# Patient Record
Sex: Female | Born: 1995 | Race: White | Hispanic: No | Marital: Single | Smoking: Never smoker
Health system: Southern US, Community
[De-identification: ages and names within clinical notes are randomized; demographics above are authoritative.]

## PROBLEM LIST (undated history)

## (undated) ENCOUNTER — Inpatient Hospital Stay: Payer: Self-pay

## (undated) DIAGNOSIS — Z789 Other specified health status: Secondary | ICD-10-CM

## (undated) DIAGNOSIS — F32A Depression, unspecified: Secondary | ICD-10-CM

## (undated) DIAGNOSIS — F329 Major depressive disorder, single episode, unspecified: Secondary | ICD-10-CM

## (undated) HISTORY — PX: TONSILLECTOMY: SUR1361

## (undated) HISTORY — PX: WISDOM TOOTH EXTRACTION: SHX21

## (undated) HISTORY — PX: TONSILLECTOMY: SHX5217

---
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
## 2017-07-10 DIAGNOSIS — Z3400 Encounter for supervision of normal first pregnancy, unspecified trimester: Secondary | ICD-10-CM | POA: Insufficient documentation

## 2017-07-15 ENCOUNTER — Other Ambulatory Visit: Payer: Self-pay | Admitting: Obstetrics and Gynecology

## 2017-07-15 DIAGNOSIS — Z369 Encounter for antenatal screening, unspecified: Secondary | ICD-10-CM

## 2017-07-24 ENCOUNTER — Ambulatory Visit (HOSPITAL_BASED_OUTPATIENT_CLINIC_OR_DEPARTMENT_OTHER)
Admission: RE | Admit: 2017-07-24 | Discharge: 2017-07-24 | Disposition: A | Discharge status: Home / Self Care | Payer: BLUE CROSS/BLUE SHIELD | Source: Ambulatory Visit | Attending: Obstetrics and Gynecology | Admitting: Obstetrics and Gynecology

## 2017-07-24 ENCOUNTER — Ambulatory Visit
Admission: RE | Admit: 2017-07-24 | Discharge: 2017-07-24 | Disposition: A | Discharge status: Home / Self Care | Payer: BLUE CROSS/BLUE SHIELD | Source: Ambulatory Visit | Attending: Obstetrics and Gynecology | Admitting: Obstetrics and Gynecology

## 2017-07-24 VITALS — BP 136/74 | HR 96 | Temp 98.1°F | Resp 20 | Wt 145.8 lb

## 2017-07-24 DIAGNOSIS — Z3A12 12 weeks gestation of pregnancy: Secondary | ICD-10-CM | POA: Insufficient documentation

## 2017-07-24 DIAGNOSIS — Z8639 Personal history of other endocrine, nutritional and metabolic disease: Secondary | ICD-10-CM | POA: Insufficient documentation

## 2017-07-24 DIAGNOSIS — Z369 Encounter for antenatal screening, unspecified: Secondary | ICD-10-CM | POA: Diagnosis not present

## 2017-07-24 NOTE — Progress Notes (Addendum)
Length of Consultation: 40 minutes   [name]  was referred to [facility] for genetic counseling to review prenatal screening and testing options.  This note summarizes the information we discussed.    We offered the following routine screening tests for this pregnancy:  First trimester screening, which includes nuchal translucency ultrasound screen and first trimester maternal serum marker screening.  The nuchal translucency has approximately an 80% detection rate for Down syndrome and can be positive for other chromosome abnormalities as well as congenital heart defects.  When combined with a maternal serum marker screening, the detection rate is up to 90% for Down syndrome and up to 97% for trisomy 18.     Maternal serum marker screening, a blood test that measures pregnancy proteins, can provide risk assessments for Down syndrome, trisomy 18, and open neural tube defects (spina bifida, anencephaly). Because it does not directly examine the fetus, it cannot positively diagnose or rule out these problems.  Targeted ultrasound uses high frequency sound waves to create an image of the developing fetus.  An ultrasound is often recommended as a routine means of evaluating the pregnancy.  It is also used to screen for fetal anatomy problems (for example, a heart defect) that might be suggestive of a chromosomal or other abnormality.   Should these screening tests indicate an increased concern, then the following additional testing options would be offered:  The chorionic villus sampling procedure is available for first trimester chromosome analysis.  This involves the withdrawal of a small amount of chorionic villi (tissue from the developing placenta).  Risk of pregnancy loss is estimated to be approximately 1 in 200 to 1 in 100 (0.5 to 1%).  There is approximately a 1% (1 in 100) chance that the CVS chromosome results will be unclear.  Chorionic villi cannot be  tested for neural tube defects.     Amniocentesis involves the removal of a small amount of amniotic fluid from the sac surrounding the fetus with the use of a thin needle inserted through the maternal abdomen and uterus.  Ultrasound guidance is used throughout the procedure.  Fetal cells from amniotic fluid are directly evaluated and > 99.5% of chromosome problems and > 98% of open neural tube defects can be detected. This procedure is generally performed after the 15th week of pregnancy.  The main risks to this procedure include complications leading to miscarriage in less than 1 in 200 cases (0.5%).  As another option for information if the pregnancy is suspected to be an an increased chance for certain chromosome conditions, we also reviewed the availability of cell free fetal DNA testing from maternal blood to determine whether or not the baby may have either Down syndrome, trisomy 74, or trisomy 53.  This test utilizes a maternal blood sample and DNA sequencing technology to isolate circulating cell free fetal DNA from maternal plasma.  The fetal DNA can then be analyzed for DNA sequences that are derived from the three most common chromosomes involved in aneuploidy, chromosomes 13, 18, and 21.  If the overall amount of DNA is greater than the expected level for any of these chromosomes, aneuploidy is suspected.  While we do not consider it a replacement for invasive testing and karyotype analysis, a negative result from this testing would be reassuring, though not a guarantee of a normal chromosome complement for the baby.  An abnormal result is certainly suggestive of an abnormal chromosome complement, though we would still recommend  CVS or amniocentesis to confirm any findings from this testing.  Cystic Fibrosis and Spinal Muscular Atrophy (SMA) screening were also discussed with the patient. Both conditions are recessive, which means that both parents must be carriers in order to have a child with  the disease.  Cystic fibrosis (CF) is one of the most common genetic conditions in persons of Caucasian ancestry.  This condition occurs in approximately 1 in 2,500 Caucasian persons and results in thickened secretions in the lungs, digestive, and reproductive systems.  For a baby to be at risk for having CF, both of the parents must be carriers for this condition.  Approximately 1 in 40 Caucasian persons is a carrier for CF.  Current carrier testing looks for the most common mutations in the gene for CF and can detect approximately 90% of carriers in the Caucasian population.  This means that the carrier screening can greatly reduce, but cannot eliminate, the chance for an individual to have a child with CF.  If an individual is found to be a carrier for CF, then carrier testing would be available for the partner. As part of [place] newborn screening profile, all babies born in the state of [place] will have a two-tier screening process.  Specimens are first tested to determine the concentration of immunoreactive trypsinogen (IRT).  The top 5% of specimens with the highest IRT values then undergo DNA testing using a panel of over 40 common CF mutations. SMA is a neurodegenerative disorder that leads to atrophy of skeletal muscle and overall weakness.  This condition is also more prevalent in the Caucasian population, with 1 in 40-1 in 60 persons being a carrier and 1 in 6,000-1 in 10,000 children being affected.  There are multiple forms of the disease, with some causing death in infancy to other forms with survival into adulthood.  The genetics of SMA is complex, but carrier screening can detect up to 95% of carriers in the Caucasian population.  Similar to CF, a negative result can greatly reduce, but cannot eliminate, the chance to have a child with SMA.  We obtained a detailed family history and pregnancy history.  [name] reported one paternal uncle who has a son with creatine deficiency.   He has seizures, delayed speech and delayed motor skills.  The family states that this was inherited from the child's mother (who is not related to our patient), and not the father.  There is an x-linked form of Creatine deficiency; we reviewed x-linked inheritance and the fact that if this were the correct mode of inheritance then this pregnancy would not be expected to be at risk.  However, we are happy to review medical records on this cousin if desired to help confirm the accuracy of this risk assessment.  There are various family members with RA, lupus, mental health conditions which we reviewed may have multifactorial causes, including both genetic and environmental factors. The remainder of the family history was reported to be unremarkable for birth defects, intellectual delays, recurrent pregnancy loss or known chromosome abnormalities.  [name] stated that this is her first pregnancy.  She reported no complications or exposures that would be expected to increase the risk for birth defects in this pregnancy.   After consideration of the options, [name] elected to proceed with first trimester screening and to declined CF and SMA carrier screening.  An ultrasound was performed at the time of the visit.  The gestational age was consistent with  12 weeks.  Fetal anatomy could not be assessed due to early gestational age.  Please refer to the ultrasound report for details of that study.  [name] was encouraged to call with questions or concerns.  We can be contacted at [phone].    Wilburt Finlay, MS, CGC I met with pt and agree with plan for first tri screen  H/o X linked creatine deficiency on her father's side (uncle)  Pt was told more information could be obtained on his history if she wished further evaluaiton she declined at this time

## 2017-07-28 ENCOUNTER — Telehealth: Payer: Self-pay | Admitting: Obstetrics and Gynecology

## 2017-07-28 NOTE — Telephone Encounter (Signed)
Ms. Rennis Hardingllis  elected to undergo First Trimester screening on 07/24/2017.  To review, first trimester screening, includes nuchal translucency ultrasound screen and/or first trimester maternal serum marker screening.  The nuchal translucency has approximately an 80% detection rate for Down syndrome and can be positive for other chromosome abnormalities as well as heart defects.  When combined with a maternal serum marker screening, the detection rate is up to 90% for Down syndrome and up to 97% for trisomy 13 and 18.     The results of the First Trimester Nuchal Translucency and Biochemical Screening were within normal range.  The risk for Down syndrome is now estimated to be 1 in 384.  The risk for Trisomy 13/18 is 1 in >10,000.  Should more definitive information be desired, we would offer amniocentesis.  Because we do not yet know the effectiveness of combined first and second trimester screening, we do not recommend a maternal serum screen to assess the chance for chromosome conditions.  However, if screening for neural tube defects is desired, maternal serum screening for AFP only can be performed between 15 and [redacted] weeks gestation.     Cherly Andersoneborah F. Worley Radermacher, MS, CGC

## 2017-10-24 ENCOUNTER — Observation Stay
Admission: EM | Admit: 2017-10-24 | Discharge: 2017-10-24 | Disposition: A | Discharge status: Home / Self Care | Payer: BLUE CROSS/BLUE SHIELD | Attending: Obstetrics & Gynecology | Admitting: Obstetrics & Gynecology

## 2017-10-24 DIAGNOSIS — Z882 Allergy status to sulfonamides status: Secondary | ICD-10-CM | POA: Diagnosis not present

## 2017-10-24 DIAGNOSIS — Z3A26 26 weeks gestation of pregnancy: Secondary | ICD-10-CM | POA: Insufficient documentation

## 2017-10-24 DIAGNOSIS — O9989 Other specified diseases and conditions complicating pregnancy, childbirth and the puerperium: Principal | ICD-10-CM | POA: Insufficient documentation

## 2017-10-24 DIAGNOSIS — M545 Low back pain: Secondary | ICD-10-CM | POA: Insufficient documentation

## 2017-10-24 DIAGNOSIS — Z3490 Encounter for supervision of normal pregnancy, unspecified, unspecified trimester: Secondary | ICD-10-CM

## 2017-10-24 HISTORY — DX: Other specified health status: Z78.9

## 2017-10-24 LAB — URINALYSIS, ROUTINE W REFLEX MICROSCOPIC
Bilirubin Urine: NEGATIVE
Glucose, UA: NEGATIVE mg/dL
Hgb urine dipstick: NEGATIVE
KETONES UR: NEGATIVE mg/dL
LEUKOCYTES UA: NEGATIVE
NITRITE: NEGATIVE
PH: 6 (ref 5.0–8.0)
PROTEIN: NEGATIVE mg/dL
Specific Gravity, Urine: 1.02 (ref 1.005–1.030)

## 2017-10-24 MED ORDER — IBUPROFEN 800 MG PO TABS
400.0000 mg | ORAL_TABLET | Freq: Four times a day (QID) | ORAL | Status: DC | PRN
  Administered 2017-10-24: 400 mg via ORAL
  Filled 2017-10-24: qty 1

## 2017-10-24 MED ORDER — ACETAMINOPHEN 500 MG PO TABS
1000.0000 mg | ORAL_TABLET | Freq: Four times a day (QID) | ORAL | Status: DC | PRN
  Administered 2017-10-24: 1000 mg via ORAL
  Filled 2017-10-24: qty 2

## 2017-10-24 NOTE — OB Triage Note (Signed)
Pt is G1P0 that presents from ED c/o back pain rated 7/10 that started 10/23/17 at 1900. Pt describes pain as aching and laying down makes it worse. Pt denies VB, LOF and states positive FM.

## 2017-10-24 NOTE — Discharge Instructions (Signed)
You may take 1,000 mg tylenol as needed for pain every 6 hours. You may alternate between tylenol and Ibuprofen. You may take 400 mg ibuprofen as needed for pain until Sunday 10/26/17 then discontinue. You may use heat or ice on back rotating 15 minutes on and 15 minutes off as needed.

## 2017-10-29 NOTE — Discharge Summary (Signed)
Clayborn BignessCedar B Kelly is a 21 y.o. female. She is at 2273w0d gestation. Patient's last menstrual period was 04/30/2017. Estimated Date of Delivery: 02/04/18  [facility] Chief complaint: low back pain  Location: low back Onset/timing: this morning Duration: since then Quality: aching Severity: moderate Aggravating or alleviating conditions: nothing makes worse or better Associated signs/symptoms:  No contractions, no urinary symptoms, no change in bowel habits. Context: patient has strenuous job, lifting, and thinks she may have strained her back muscles, and continues to do so.  No relief with heating pad.  Has had this outside of pregnancy.    S: Resting comfortably. no CTX, no VB.no LOF,  Active fetal movement.   Maternal Medical History:   Past Medical History:  Diagnosis Date  . Medical history non-contributory     Past Surgical History:  Procedure Laterality Date  . TONSILLECTOMY Bilateral   . WISDOM TOOTH EXTRACTION Bilateral     Allergies  Allergen Reactions  . Sulfa Antibiotics     Prior to Admission medications   Medication Sig Start Date End Date Taking? Authorizing Provider  docusate sodium (COLACE) 100 MG capsule Take 100 mg by mouth daily.   Yes [provider]  doxylamine, Sleep, (UNISOM) 25 MG tablet Take 25 mg by mouth at bedtime as needed.   Yes [provider]  ferrous sulfate 325 (65 FE) MG EC tablet Take 325 mg by mouth 3 (three) times daily with meals.   Yes [provider]  Multiple Vitamin (MULTIVITAMIN) tablet Take 1 tablet by mouth daily.   Yes [provider]     Social History: She  reports that  has never smoked. she has never used smokeless tobacco. She reports that she does not drink alcohol or use drugs.  Family History: family history includes Lupus in her mother; Rheum arthritis in her mother. no history of gyn cancers  Review of Systems: A full review of systems was performed and  negative except as noted in the HPI.     O:  BP 123/63   Pulse 80   Temp 98.2 F (36.8 C) (Oral)   Resp 18   Ht 5\' 6"  (1.676 m)   Wt 72.1 kg (159 lb)   LMP 04/30/2017   BMI 25.66 kg/m  No results found for this or any previous visit (from the past 48 hour(s)).   Constitutional: NAD, AAOx3  HE/ENT: extraocular movements grossly intact, moist mucous membranes CV: RRR PULM: nl respiratory effort, CTABL    Back:  No step offs, verterbral tenderness, hip tenderness.  No decreased range of motion.  Able to bend and twist.  Abd: gravid, non-tender, non-distended, soft  Ext: Non-tender, Nonedmeatous   Psych: mood appropriate, speech normal Pelvic deferred  FHR 150 TOCO: none   A/P: 21 y.o. 2627w0d here for MSK back pain  Labor: not present.   Tylenol, ibuprofen (can take for 3 days), heating pad, rest.  D/c home stable, precautions reviewed, follow-up as scheduled.   ----- Ranae Plumberhelsea Ward, MD Attending Obstetrician and Gynecologist [facility], Department of OB/GYN [facility]

## 2017-12-23 NOTE — L&D Delivery Note (Signed)
Delivery Note At 10:50 AM a viable female was delivered via Vaginal, Spontaneous VTX / ROA  APGAR: 8, 9; weight  . Tight nuchal cord reduced   Placenta status:intact  , . Co rd:3V , delayed cord clamping  with the following complications: significant right side wall laceration . EBL 1300 cc Pt pushed for 90 min and ultimately delivered head over intact perineum . Shoulders delivered without difficulty . Vigorous female placed on mother's abd . Cord clamped+ cut at 60 sec. Placenta delivered intact . Significant right side wall laceration noted .2/3 of vagina length involved.2 running locking sutures used to control bleeding . Second degree lac repaired . Good hemostasis noted . Total EBL 1300cc  Mother tolerated well Anesthesia:  CLE Episiotomy:  none Lacerations: Sulcus;Vaginal;2nd degree Suture Repair: 2.0 3.0 vicryl Est. Blood Loss (mL): 1300   Mom to postpartum.  Baby to Couplet care / Skin to Skin.  Rachel Ortega Rachel Ortega 02/06/2018, 11:50 AM

## 2018-02-05 ENCOUNTER — Inpatient Hospital Stay
Admission: EM | Admit: 2018-02-05 | Discharge: 2018-02-08 | DRG: 806 | Disposition: A | Discharge status: Home / Self Care | Payer: Medicaid Other | Attending: Certified Nurse Midwife | Admitting: Certified Nurse Midwife

## 2018-02-05 ENCOUNTER — Other Ambulatory Visit: Payer: Self-pay

## 2018-02-05 DIAGNOSIS — Z3A40 40 weeks gestation of pregnancy: Secondary | ICD-10-CM | POA: Diagnosis not present

## 2018-02-05 DIAGNOSIS — O1493 Unspecified pre-eclampsia, third trimester: Secondary | ICD-10-CM

## 2018-02-05 DIAGNOSIS — O163 Unspecified maternal hypertension, third trimester: Secondary | ICD-10-CM | POA: Diagnosis present

## 2018-02-05 DIAGNOSIS — O9081 Anemia of the puerperium: Secondary | ICD-10-CM | POA: Diagnosis not present

## 2018-02-05 DIAGNOSIS — D696 Thrombocytopenia, unspecified: Secondary | ICD-10-CM | POA: Diagnosis not present

## 2018-02-05 DIAGNOSIS — D62 Acute posthemorrhagic anemia: Secondary | ICD-10-CM | POA: Diagnosis not present

## 2018-02-05 DIAGNOSIS — O1494 Unspecified pre-eclampsia, complicating childbirth: Principal | ICD-10-CM | POA: Diagnosis present

## 2018-02-05 DIAGNOSIS — D6959 Other secondary thrombocytopenia: Secondary | ICD-10-CM | POA: Diagnosis not present

## 2018-02-05 HISTORY — DX: Unspecified pre-eclampsia, third trimester: O14.93

## 2018-02-05 LAB — CBC
HCT: 37.7 % (ref 35.0–47.0)
HEMOGLOBIN: 13 g/dL (ref 12.0–16.0)
MCH: 32.9 pg (ref 26.0–34.0)
MCHC: 34.5 g/dL (ref 32.0–36.0)
MCV: 95.4 fL (ref 80.0–100.0)
PLATELETS: 172 10*3/uL (ref 150–440)
RBC: 3.95 MIL/uL (ref 3.80–5.20)
RDW: 13.3 % (ref 11.5–14.5)
WBC: 8.6 10*3/uL (ref 3.6–11.0)

## 2018-02-05 LAB — COMPREHENSIVE METABOLIC PANEL
ALBUMIN: 2.6 g/dL — AB (ref 3.5–5.0)
ALK PHOS: 215 U/L — AB (ref 38–126)
ALT: 11 U/L — ABNORMAL LOW (ref 14–54)
ANION GAP: 6 (ref 5–15)
AST: 18 U/L (ref 15–41)
BILIRUBIN TOTAL: 0.5 mg/dL (ref 0.3–1.2)
BUN: 11 mg/dL (ref 6–20)
CALCIUM: 8.7 mg/dL — AB (ref 8.9–10.3)
CO2: 25 mmol/L (ref 22–32)
Chloride: 107 mmol/L (ref 101–111)
Creatinine, Ser: 0.66 mg/dL (ref 0.44–1.00)
Glucose, Bld: 106 mg/dL — ABNORMAL HIGH (ref 65–99)
Potassium: 4 mmol/L (ref 3.5–5.1)
Sodium: 138 mmol/L (ref 135–145)
TOTAL PROTEIN: 6.6 g/dL (ref 6.5–8.1)

## 2018-02-05 LAB — TYPE AND SCREEN
ABO/RH(D): A POS
Antibody Screen: NEGATIVE

## 2018-02-05 LAB — PROTEIN / CREATININE RATIO, URINE
CREATININE, URINE: 97 mg/dL
Protein Creatinine Ratio: 0.91 mg/mg{Cre} — ABNORMAL HIGH (ref 0.00–0.15)
TOTAL PROTEIN, URINE: 88 mg/dL

## 2018-02-05 MED ORDER — TERBUTALINE SULFATE 1 MG/ML IJ SOLN: 0.2500 mg | Freq: Once | INTRAMUSCULAR | Status: DC | PRN

## 2018-02-05 MED ORDER — LACTATED RINGERS IV SOLN: 500.0000 mL | INTRAVENOUS | Status: DC | PRN

## 2018-02-05 MED ORDER — MISOPROSTOL 25 MCG QUARTER TABLET
25.0000 ug | ORAL_TABLET | Freq: Once | ORAL | Status: AC
  Administered 2018-02-06: 25 ug via BUCCAL
  Filled 2018-02-05: qty 1

## 2018-02-05 MED ORDER — LIDOCAINE HCL (PF) 1 % IJ SOLN: 30.0000 mL | INTRAMUSCULAR | Status: DC | PRN

## 2018-02-05 MED ORDER — OXYTOCIN 40 UNITS IN LACTATED RINGERS INFUSION - SIMPLE MED
2.5000 [IU]/h | INTRAVENOUS | Status: DC
  Filled 2018-02-05: qty 1000

## 2018-02-05 MED ORDER — ACETAMINOPHEN 325 MG PO TABS: 650.0000 mg | ORAL_TABLET | ORAL | Status: DC | PRN

## 2018-02-05 MED ORDER — ONDANSETRON HCL 4 MG/2ML IJ SOLN: 4.0000 mg | Freq: Four times a day (QID) | INTRAMUSCULAR | Status: DC | PRN

## 2018-02-05 MED ORDER — LACTATED RINGERS IV SOLN
INTRAVENOUS | Status: DC
  Administered 2018-02-05 – 2018-02-06 (×2): via INTRAVENOUS

## 2018-02-05 MED ORDER — BUTORPHANOL TARTRATE 1 MG/ML IJ SOLN
1.0000 mg | INTRAMUSCULAR | Status: DC | PRN
  Administered 2018-02-06 (×2): 1 mg via INTRAVENOUS
  Filled 2018-02-05 (×2): qty 1

## 2018-02-05 MED ORDER — OXYTOCIN BOLUS FROM INFUSION
500.0000 mL | Freq: Once | INTRAVENOUS | Status: AC
  Administered 2018-02-06: 500 mL via INTRAVENOUS

## 2018-02-05 MED ORDER — MISOPROSTOL 25 MCG QUARTER TABLET
25.0000 ug | ORAL_TABLET | ORAL | Status: DC | PRN
  Administered 2018-02-06: 25 ug via VAGINAL
  Filled 2018-02-05: qty 1

## 2018-02-05 NOTE — ED Notes (Signed)
Called L&D for room  States rooms are being cleaned  Instructed to hold pt until room is cleaned  Pt and husband made aware

## 2018-02-05 NOTE — H&P (Signed)
OB History & Physical   History of Present Illness:  Chief Complaint:   HPI:  Rachel Ortega is a 22 y.o. G1P0 female at 7565w1d dated by LMP c/w 162w1d u/s.  She presents to L&D for IOL due to preeclampsia without severe features at term.  She reports:  -active fetal movement -no leakage of fluid  -no vaginal bleeding -no contractions  Pregnancy Issues: 1. Heartburn 2. Elevated 1h OGTT, 3h WNL 3. Pruritis    Maternal Medical History:   Past Medical History:  Diagnosis Date  . Medical history non-contributory     Past Surgical History:  Procedure Laterality Date  . TONSILLECTOMY Bilateral   . WISDOM TOOTH EXTRACTION Bilateral     Allergies  Allergen Reactions  . Sulfa Antibiotics     Prior to Admission medications   Medication Sig Start Date End Date Taking? Authorizing Provider  doxylamine, Sleep, (UNISOM) 25 MG tablet Take 25 mg by mouth at bedtime as needed.   Yes [provider]  ferrous sulfate 325 (65 FE) MG EC tablet Take 325 mg by mouth 3 (three) times daily with meals.   Yes [provider]  Multiple Vitamin (MULTIVITAMIN) tablet Take 1 tablet by mouth daily.   Yes [provider]  docusate sodium (COLACE) 100 MG capsule Take 100 mg by mouth daily.    [provider]     [facility]    Social History: She  reports that  has never smoked. she has never used smokeless tobacco. She reports that she does not drink alcohol or use drugs.  Family History: family history includes Lupus in her mother; Rheum arthritis in her mother.   Review of Systems: A full review of systems was performed and negative except as noted in the HPI.    Physical Exam:  Vital Signs: BP (!) 136/92   Pulse (!) 108   LMP 04/30/2017   General:   alert, cooperative, appears stated age and no distress  Skin:  normal and no rash or abnormalities  Neurologic:    Alert & oriented x 3  Lungs:   clear to auscultation  bilaterally  Heart:   regular rate and rhythm, S1, S2 normal, no murmur, click, rub or gallop  Abdomen:  soft, non-tender; bowel sounds normal; no masses,  no organomegaly  Pelvis:  External genitalia: normal general appearance  FHT:  145 BPM  Presentations: cephalic  Cervix:    Dilation: 1cm   Effacement: 50%   Station:  -2   Consistency: medium   Position: posterior  Extremities: : non-tender, symmetric, no edema bilaterally.      EFW: 7lb  Results for orders placed or performed during the hospital encounter of 02/05/18 (from the past 24 hour(s))  Protein / creatinine ratio, urine     Status: Abnormal   Collection Time: 02/05/18  6:50 PM  Result Value Ref Range   Creatinine, Urine 97 mg/dL   Total Protein, Urine 88 mg/dL   Protein Creatinine Ratio 0.91 (H) 0.00 - 0.15 mg/mg[Cre]  CBC on admission     Status: None   Collection Time: 02/05/18  7:00 PM  Result Value Ref Range   WBC 8.6 3.6 - 11.0 K/uL   RBC 3.95 3.80 - 5.20 MIL/uL   Hemoglobin 13.0 12.0 - 16.0 g/dL   HCT 16.137.7 09.635.0 - 04.547.0 %   MCV 95.4 80.0 - 100.0 fL   MCH 32.9 26.0 - 34.0 pg   MCHC 34.5 32.0 -  36.0 g/dL   RDW 84.6 96.2 - 95.2 %   Platelets 172 150 - 440 K/uL  Comprehensive metabolic panel     Status: Abnormal   Collection Time: 02/05/18  7:00 PM  Result Value Ref Range   Sodium 138 135 - 145 mmol/L   Potassium 4.0 3.5 - 5.1 mmol/L   Chloride 107 101 - 111 mmol/L   CO2 25 22 - 32 mmol/L   Glucose, Bld 106 (H) 65 - 99 mg/dL   BUN 11 6 - 20 mg/dL   Creatinine, Ser 8.41 0.44 - 1.00 mg/dL   Calcium 8.7 (L) 8.9 - 10.3 mg/dL   Total Protein 6.6 6.5 - 8.1 g/dL   Albumin 2.6 (L) 3.5 - 5.0 g/dL   AST 18 15 - 41 U/L   ALT 11 (L) 14 - 54 U/L   Alkaline Phosphatase 215 (H) 38 - 126 U/L   Total Bilirubin 0.5 0.3 - 1.2 mg/dL   GFR calc non Af Amer >60 >60 mL/min   GFR calc Af Amer >60 >60 mL/min   Anion gap 6 5 - 15    Pertinent Results:  Prenatal Labs: Blood type/Rh A+  Antibody screen neg  Rubella  Immune  Varicella Immune  RPR NR  HBsAg Neg  HIV NR  GC neg  Chlamydia neg  Genetic screening negative  1 hour GTT 141  3 hour GTT 80, 140, 106, 117  GBS neg   FHT: FHR: 145 bpm, variability: moderate,  accelerations:  Present,  decelerations:  Present variable Category/reactivity:  Category II TOCO: none    Assessment:  Rachel Ortega is a 22 y.o. G1P0 female at [redacted]w[redacted]d with preeclampsia without severe features.   Plan:  1. Admit to Labor & Delivery; consents reviewed and obtained  2. Fetal Well being  - Fetal Tracing: Category II for non-recurrent variable decels, overall reassuring - GBS neg - Presentation: cephalic confirmed by [place]   3. Routine OB: - Prenatal labs reviewed, as above - Rh pos - CBC & T&S on admit - Clear fluids, IVF  4. Induction of Labor: -  External toco in place -  Pelvis unproven -  Plan for induction with misoprostol -  Plan for continuous fetal monitoring  -  Maternal pain control as desired: IVPM, nitrous, regional anesthesia -  Anticipate vaginal delivery  5. Preeclampsia without severe features: - Elevated P/C ratio - Reports no headache, visual changes, N/V, RUQ pain, SOB, or chest pain.  - CBC and CMP WNL - Mild range BPs  6. Post Partum Planning: - Infant feeding: breastfeeding - Contraception: POPs  Genia Del, [place] 02/05/2018 7:32 PM ----- Genia Del Certified Nurse Midwife [facility], Department of OB/GYN [facility]

## 2018-02-06 ENCOUNTER — Inpatient Hospital Stay: Payer: Medicaid Other | Admitting: Anesthesiology

## 2018-02-06 DIAGNOSIS — O1490 Unspecified pre-eclampsia, unspecified trimester: Secondary | ICD-10-CM

## 2018-02-06 LAB — CBC
HCT: 33.9 % — ABNORMAL LOW (ref 35.0–47.0)
HCT: 34.1 % — ABNORMAL LOW (ref 35.0–47.0)
HCT: 31.2 % — ABNORMAL LOW (ref 35.0–47.0)
HEMOGLOBIN: 10.5 g/dL — AB (ref 12.0–16.0)
Hemoglobin: 11.3 g/dL — ABNORMAL LOW (ref 12.0–16.0)
Hemoglobin: 11.4 g/dL — ABNORMAL LOW (ref 12.0–16.0)
MCH: 32.1 pg (ref 26.0–34.0)
MCH: 32.3 pg (ref 26.0–34.0)
MCH: 32.3 pg (ref 26.0–34.0)
MCHC: 33.2 g/dL (ref 32.0–36.0)
MCHC: 33.5 g/dL (ref 32.0–36.0)
MCHC: 33.5 g/dL (ref 32.0–36.0)
MCV: 95.8 fL (ref 80.0–100.0)
MCV: 97.2 fL (ref 80.0–100.0)
MCV: 96.3 fL (ref 80.0–100.0)
PLATELETS: 193 10*3/uL (ref 150–440)
Platelets: 202 10*3/uL (ref 150–440)
Platelets: 173 10*3/uL (ref 150–440)
RBC: 3.49 MIL/uL — AB (ref 3.80–5.20)
RBC: 3.56 MIL/uL — ABNORMAL LOW (ref 3.80–5.20)
RBC: 3.24 MIL/uL — AB (ref 3.80–5.20)
RDW: 13.1 % (ref 11.5–14.5)
RDW: 13.6 % (ref 11.5–14.5)
RDW: 13.5 % (ref 11.5–14.5)
WBC: 23.2 10*3/uL — AB (ref 3.6–11.0)
WBC: 24 10*3/uL — AB (ref 3.6–11.0)
WBC: 17.3 10*3/uL — ABNORMAL HIGH (ref 3.6–11.0)

## 2018-02-06 LAB — COMPREHENSIVE METABOLIC PANEL
ALK PHOS: 179 U/L — AB (ref 38–126)
ALT: 12 U/L — AB (ref 14–54)
AST: 37 U/L (ref 15–41)
Albumin: 2.2 g/dL — ABNORMAL LOW (ref 3.5–5.0)
Anion gap: 13 (ref 5–15)
BUN: 11 mg/dL (ref 6–20)
CALCIUM: 8.3 mg/dL — AB (ref 8.9–10.3)
CHLORIDE: 106 mmol/L (ref 101–111)
CO2: 17 mmol/L — AB (ref 22–32)
Creatinine, Ser: 0.67 mg/dL (ref 0.44–1.00)
GFR calc non Af Amer: >60 mL/min (ref >60)
Glucose, Bld: 117 mg/dL — ABNORMAL HIGH (ref 65–99)
Potassium: 3.5 mmol/L (ref 3.5–5.1)
SODIUM: 136 mmol/L (ref 135–145)
Total Bilirubin: 0.5 mg/dL (ref 0.3–1.2)
Total Protein: 5.6 g/dL — ABNORMAL LOW (ref 6.5–8.1)

## 2018-02-06 MED ORDER — ONDANSETRON HCL 4 MG PO TABS: 4.0000 mg | ORAL_TABLET | ORAL | Status: DC | PRN

## 2018-02-06 MED ORDER — DIBUCAINE 1 % RE OINT: 1.0000 "application " | TOPICAL_OINTMENT | RECTAL | Status: DC | PRN

## 2018-02-06 MED ORDER — SODIUM CHLORIDE 0.9 % IV SOLN
INTRAVENOUS | Status: DC | PRN
  Administered 2018-02-06 (×3): 5 mL via EPIDURAL

## 2018-02-06 MED ORDER — DEXTROMETHORPHAN POLISTIREX ER 30 MG/5ML PO SUER
15.0000 mg | Freq: Four times a day (QID) | ORAL | Status: DC | PRN
  Administered 2018-02-07 – 2018-02-08 (×3): 15 mg via ORAL
  Filled 2018-02-06 (×7): qty 5

## 2018-02-06 MED ORDER — ONDANSETRON HCL 4 MG/2ML IJ SOLN: 4.0000 mg | INTRAMUSCULAR | Status: DC | PRN

## 2018-02-06 MED ORDER — CARBOPROST TROMETHAMINE 250 MCG/ML IM SOLN
INTRAMUSCULAR | Status: AC
  Administered 2018-02-06: 250 ug
  Filled 2018-02-06: qty 1

## 2018-02-06 MED ORDER — MEASLES, MUMPS & RUBELLA VAC ~~LOC~~ INJ
0.5000 mL | INJECTION | Freq: Once | SUBCUTANEOUS | Status: DC
  Filled 2018-02-06 (×2): qty 0.5

## 2018-02-06 MED ORDER — DIPHENHYDRAMINE HCL 25 MG PO CAPS: 25.0000 mg | ORAL_CAPSULE | Freq: Once | ORAL | Status: DC | PRN

## 2018-02-06 MED ORDER — PHENYLEPHRINE 40 MCG/ML (10ML) SYRINGE FOR IV PUSH (FOR BLOOD PRESSURE SUPPORT)
80.0000 ug | PREFILLED_SYRINGE | INTRAVENOUS | Status: DC | PRN
  Filled 2018-02-06: qty 5

## 2018-02-06 MED ORDER — LACTATED RINGERS IV SOLN: 500.0000 mL | Freq: Once | INTRAVENOUS | Status: DC

## 2018-02-06 MED ORDER — MORPHINE SULFATE (PF) 4 MG/ML IV SOLN
5.0000 mg | Freq: Once | INTRAVENOUS | Status: AC
  Administered 2018-02-06: 5 mg via INTRAMUSCULAR

## 2018-02-06 MED ORDER — LIDOCAINE HCL (PF) 1 % IJ SOLN
INTRAMUSCULAR | Status: DC | PRN
  Administered 2018-02-06: 3 mL

## 2018-02-06 MED ORDER — SIMETHICONE 80 MG PO CHEW: 80.0000 mg | CHEWABLE_TABLET | ORAL | Status: DC | PRN

## 2018-02-06 MED ORDER — IBUPROFEN 600 MG PO TABS
600.0000 mg | ORAL_TABLET | Freq: Four times a day (QID) | ORAL | Status: DC
  Administered 2018-02-06 – 2018-02-08 (×8): 600 mg via ORAL
  Filled 2018-02-06 (×8): qty 1

## 2018-02-06 MED ORDER — EPHEDRINE 5 MG/ML INJ
10.0000 mg | INTRAVENOUS | Status: DC | PRN
  Filled 2018-02-06: qty 2

## 2018-02-06 MED ORDER — DIPHENHYDRAMINE HCL 25 MG PO CAPS: 25.0000 mg | ORAL_CAPSULE | Freq: Four times a day (QID) | ORAL | Status: DC | PRN

## 2018-02-06 MED ORDER — MAGNESIUM HYDROXIDE 400 MG/5ML PO SUSP: 30.0000 mL | ORAL | Status: DC | PRN

## 2018-02-06 MED ORDER — FENTANYL 2.5 MCG/ML W/ROPIVACAINE 0.15% IN NS 100 ML EPIDURAL (ARMC)
12.0000 mL/h | EPIDURAL | Status: DC
  Administered 2018-02-06: 12 mL/h via EPIDURAL

## 2018-02-06 MED ORDER — FERROUS SULFATE 325 (65 FE) MG PO TABS
325.0000 mg | ORAL_TABLET | Freq: Two times a day (BID) | ORAL | Status: DC
  Filled 2018-02-06: qty 1

## 2018-02-06 MED ORDER — WITCH HAZEL-GLYCERIN EX PADS
1.0000 "application " | MEDICATED_PAD | CUTANEOUS | Status: DC | PRN
  Administered 2018-02-07: 1 via TOPICAL
  Filled 2018-02-06: qty 100

## 2018-02-06 MED ORDER — MORPHINE SULFATE (PF) 10 MG/ML IV SOLN
INTRAVENOUS | Status: AC
  Filled 2018-02-06: qty 1

## 2018-02-06 MED ORDER — PRENATAL MULTIVITAMIN CH
1.0000 | ORAL_TABLET | Freq: Every day | ORAL | Status: DC
  Filled 2018-02-06 (×2): qty 1

## 2018-02-06 MED ORDER — SENNOSIDES-DOCUSATE SODIUM 8.6-50 MG PO TABS
2.0000 | ORAL_TABLET | ORAL | Status: DC
  Administered 2018-02-07 – 2018-02-08 (×2): 2 via ORAL
  Filled 2018-02-06: qty 2

## 2018-02-06 MED ORDER — ZOLPIDEM TARTRATE 5 MG PO TABS: 5.0000 mg | ORAL_TABLET | Freq: Every evening | ORAL | Status: DC | PRN

## 2018-02-06 MED ORDER — HYDROCODONE-ACETAMINOPHEN 5-325 MG PO TABS
1.0000 | ORAL_TABLET | ORAL | Status: DC | PRN
  Administered 2018-02-06 – 2018-02-07 (×4): 1 via ORAL
  Filled 2018-02-06 (×4): qty 1

## 2018-02-06 MED ORDER — ACETAMINOPHEN 325 MG PO TABS: 650.0000 mg | ORAL_TABLET | ORAL | Status: DC | PRN

## 2018-02-06 MED ORDER — MORPHINE SULFATE (PF) 4 MG/ML IV SOLN
5.0000 mg | Freq: Once | INTRAVENOUS | Status: AC
  Administered 2018-02-06: 5 mg via INTRAVENOUS

## 2018-02-06 MED ORDER — DIPHENHYDRAMINE HCL 50 MG/ML IJ SOLN: 12.5000 mg | INTRAMUSCULAR | Status: DC | PRN

## 2018-02-06 MED ORDER — FENTANYL 2.5 MCG/ML W/ROPIVACAINE 0.15% IN NS 100 ML EPIDURAL (ARMC)
EPIDURAL | Status: AC
  Filled 2018-02-06: qty 100

## 2018-02-06 MED ORDER — BENZOCAINE-MENTHOL 20-0.5 % EX AERO
1.0000 "application " | INHALATION_SPRAY | CUTANEOUS | Status: DC | PRN
  Administered 2018-02-06 – 2018-02-07 (×2): 1 via TOPICAL
  Filled 2018-02-06 (×2): qty 56

## 2018-02-06 MED ORDER — COCONUT OIL OIL: 1.0000 "application " | TOPICAL_OIL | Status: DC | PRN

## 2018-02-06 NOTE — Progress Notes (Signed)
Labor Progress Note  Rachel Ortega is a 22 y.o. G1P0 at 355w2d by LMP c/w 7083w1d u/s admitted for induction of labor due to preeclampsia without severe features.  Subjective:  Patient resting in triage room. Mom and boyfriend present for support. Reports good fetal movement, no VB, no LOF, no cramping or contractions. Reports no headache, visual changes, N/V, SOB, chest pain, or RUQ pain.   Objective: BP 137/86   Pulse 86   LMP 04/30/2017  Notable VS details: mild range Bps 120-143/83-101  Fetal Assessment: FHT:  FHR: 150 bpm, variability: moderate,  accelerations:  Present,  decelerations:  Absent Category/reactivity:  Category I UC:   none Membrane status: intact Amniotic color: n/a  Assessment / Plan: Admitted for IOL due to preeclampsia  Induction of Labor: Plan to administer 25mcg buccal misoprostol and 25mcg vaginal misoprostol as soon as patient is moved into L&D room, Preeclampsia:  labs stable and no S/S of severe features Fetal Wellbeing:  Category I Pain Control:  Epidural, IV pain meds and Nitrous Oxide available as needed and desired Anticipated MOD:  NSVD  Genia DelMargaret Akiyah Eppolito, CNM 02/06/2018, 12:07 AM

## 2018-02-06 NOTE — Anesthesia Procedure Notes (Signed)
Epidural Patient location during procedure: OB Start time: 02/06/2018 6:29 AM End time: 02/06/2018 6:50 AM  Staffing Anesthesiologist: Alver FisherPenwarden, Elizaveta Mattice, MD Performed: anesthesiologist   Preanesthetic Checklist Completed: patient identified, site marked, surgical consent, pre-op evaluation, timeout performed, IV checked, risks and benefits discussed and monitors and equipment checked  Epidural Patient position: sitting Prep: ChloraPrep Patient monitoring: heart rate, continuous pulse ox and blood pressure Approach: midline Location: L3-L4 Injection technique: LOR saline  Needle:  Needle type: Tuohy  Needle gauge: 18 G Needle length: 9 cm and 9 Needle insertion depth: 7 cm Catheter type: closed end flexible Catheter size: 20 Guage Catheter at skin depth: 11 cm Test dose: negative (0.125% bupivacaine)  Assessment Events: blood not aspirated, injection not painful, no injection resistance, negative IV test and no paresthesia  Additional Notes   Patient tolerated the insertion well without complications.Reason for block:procedure for pain

## 2018-02-06 NOTE — Progress Notes (Signed)
Labor Progress Note  Rachel Ortega is a 22 y.o. G1P0 at 567w2d by LMP c/w 7571w1d u/s admitted for induction of labor due to preeclampsia without severe features.  Subjective:  Pt standing with IV pole, reporting painful contractions.   Objective: BP 123/72 (BP Location: Right Arm)   Pulse 99   LMP 04/30/2017  Notable VS details: normal range over past 5 hours  Fetal Assessment: FHT:  FHR: 125 bpm, variability: moderate,  accelerations:  Present,  decelerations:  Absent Category/reactivity:  Category I UC:   irregular, every 1-5 minutes, difficult to assess by toco; mild by paplpation  SVE:   1/50%/-2 per RN exam at 0353 Membrane status: intact Amniotic color: n/a  Assessment / Plan: Admitted for IOL due to preeclampsia  Induction of Labor: S/p 25mcg buccal misoprostol and 25mcg vaginal misoprostol. Plan to assess contraction pattern once Rachel Ortega is comfortably resting in bed and redose misoprostol if appropriate.  Preeclampsia: VSS, no S/S of severe features; plan for labs to be redrawn this morning Fetal Wellbeing:  Category I, areas of broken tracing and tracing maternal HR with movement Pain Control:  S/p two 1mg  IV Stadol doses with no relief. Discussed options for pain management at this time. Patient has opted for therapeutic sleep with morphine 5mg  IV and 5mg  IM.  Anticipated MOD:  NSVD   Genia DelMargaret Kiarra Kidd, CNM 02/06/2018, 5:40 AM

## 2018-02-06 NOTE — Progress Notes (Signed)
QBL 

## 2018-02-06 NOTE — Lactation Note (Signed)
This note was copied from a baby's chart. Lactation Consultation Note  Patient Name: Rachel Ortega ZOXWR'UToday's Date: 02/06/2018 Reason for consult: Follow-up assessment;Primapara   Maternal Data Formula Feeding for Exclusion: No Mom pumped obtained approx. 0.5 cc colostrum, pumped 15 min Feeding Feeding Type: Breast Milk with Formula added By syringe, 0.5 cc colostrum and 5 cc Daron OfferGerber [facility] Latch: Too sleepy or reluctant, no latch achieved, no sucking elicited.                 Interventions Interventions: DEBP  Lactation Tools Discussed/Used Tools: 6F feeding tube / Syringe Nipple shield size: 20 Pump Review: Setup, frequency, and cleaning Initiated by:: Cay SchillingsM Laronn Devonshire RNC IBCLC Date initiated:: 02/06/18   Consult Status Consult Status: Follow-up Date: 02/07/18 Follow-up type: In-patient    Dyann KiefMarsha D Zaylah Blecha 02/06/2018, 5:21 PM

## 2018-02-06 NOTE — Lactation Note (Signed)
This note was copied from a baby's chart. Lactation Consultation Note  Patient Name: Rachel Ortega Today's Date: 02/06/2018 Reason for consult: Initial assessment;Primapara;1st time breastfeeding   Maternal Data Formula Feeding for Exclusion: No Has patient been taught Hand Expression?: Yes Does the patient have breastfeeding experience prior to this delivery?: No Wide spaced breasts noted, unable to express drops of colostrum Feeding  attempted multiple times in different positions, baby would open mouth at breast, would not suck, would suck on own fingers  LATCH Score Latch: Too sleepy or reluctant, no latch achieved, no sucking elicited.  Audible Swallowing: None  Type of Nipple: Everted at rest and after stimulation  Comfort (Breast/Nipple): Soft / non-tender  Hold (Positioning): Full assist, staff holds infant at breast  LATCH Score: 4  Interventions Interventions: Breast feeding basics reviewed;Assisted with latch;Skin to skin;Hand express;Adjust position;Support pillows  Lactation Tools Discussed/Used     Consult Status Consult Status: Follow-up Date: 02/06/18 Follow-up type: In-patient    Dyann KiefMarsha D Latona Krichbaum 02/06/2018, 1:31 PM

## 2018-02-06 NOTE — Anesthesia Preprocedure Evaluation (Signed)
Anesthesia Evaluation  Patient identified by MRN, date of birth, ID band Patient awake    Reviewed: Allergy & Precautions, NPO status , Patient's Chart, lab work & pertinent test results  History of Anesthesia Complications Negative for: history of anesthetic complications  Airway Mallampati: III  TM Distance: >3 FB Neck ROM: Full    Dental no notable dental hx.    Pulmonary neg pulmonary ROS, neg sleep apnea, neg COPD,    breath sounds clear to auscultation- rhonchi (-) wheezing      Cardiovascular Exercise Tolerance: Good hypertension (preeclampsia w/o SF), (-) CAD and (-) Past MI  Rhythm:Regular Rate:Normal - Systolic murmurs and - Diastolic murmurs    Neuro/Psych negative neurological ROS  negative psych ROS   GI/Hepatic negative GI ROS, Neg liver ROS,   Endo/Other  negative endocrine ROSneg diabetes  Renal/GU negative Renal ROS     Musculoskeletal negative musculoskeletal ROS (+)   Abdominal (+) - obese, Gravid abdomen  Peds  Hematology negative hematology ROS (+)   Anesthesia Other Findings   Reproductive/Obstetrics                             Lab Results  Component Value Date   WBC 8.6 02/05/2018   HGB 13.0 02/05/2018   HCT 37.7 02/05/2018   MCV 95.4 02/05/2018   PLT 172 02/05/2018    Anesthesia Physical Anesthesia Plan  ASA: II  Anesthesia Plan: Epidural   Post-op Pain Management:    Induction:   PONV Risk Score and Plan: 2  Airway Management Planned:   Additional Equipment:   Intra-op Plan:   Post-operative Plan:   Informed Consent: I have reviewed the patients History and Physical, chart, labs and discussed the procedure including the risks, benefits and alternatives for the proposed anesthesia with the patient or authorized representative who has indicated his/her understanding and acceptance.     Plan Discussed with: CRNA and  Anesthesiologist  Anesthesia Plan Comments: (Plan for epidural for labor, discussed epidural vs spinal vs GA if need for csection)        Anesthesia Quick Evaluation

## 2018-02-06 NOTE — Progress Notes (Signed)
Labor Progress Note  Rachel Ortega a 21 y.o.G1P0 at 5046w2d by LMP c/w 1558w1d u/sadmitted for induction of labor due topreeclampsia without severe features.  Subjective:  Much more comfortable post-epidural placement. Boyfriend and mom at the bedside for support.   Objective: BP 123/71   Pulse 76   Temp 98.3 F (36.8 C) (Oral)   LMP 04/30/2017   SpO2 97%  Notable VS details: normotensive to mild range  Fetal Assessment: FHT:  FHR: 130 bpm, variability: moderate,  accelerations:  Present,  decelerations:  Present early Category/reactivity:  Category I UC:   regular, every 1-4 minutes SVE:    Dilation: 7cm  Effacement: 90%  Station:  -1  Consistency: soft  Position: anterior  Membrane status: intact Amniotic color: n/a  Assessment / Plan: Induction of labor due to preeclampsia  Labor: Progressing normally Preeclampsia:  VSS, no S/S of severe features, repeat labs pending Fetal Wellbeing:  Category I Pain Control:  Epidural Anticipated MOD:  NSVD  Rachel Ortega, CNM 02/06/2018, 7:33 AM

## 2018-02-07 DIAGNOSIS — D62 Acute posthemorrhagic anemia: Secondary | ICD-10-CM | POA: Diagnosis not present

## 2018-02-07 DIAGNOSIS — D6959 Other secondary thrombocytopenia: Secondary | ICD-10-CM | POA: Diagnosis not present

## 2018-02-07 LAB — CBC
HCT: 26.4 % — ABNORMAL LOW (ref 35.0–47.0)
HEMATOCRIT: 26.4 % — AB (ref 35.0–47.0)
HEMOGLOBIN: 8.7 g/dL — AB (ref 12.0–16.0)
HEMOGLOBIN: 9.1 g/dL — AB (ref 12.0–16.0)
MCH: 31.7 pg (ref 26.0–34.0)
MCH: 33.2 pg (ref 26.0–34.0)
MCHC: 32.9 g/dL (ref 32.0–36.0)
MCHC: 34.3 g/dL (ref 32.0–36.0)
MCV: 96.3 fL (ref 80.0–100.0)
MCV: 96.9 fL (ref 80.0–100.0)
Platelets: 129 10*3/uL — ABNORMAL LOW (ref 150–440)
Platelets: 141 10*3/uL — ABNORMAL LOW (ref 150–440)
RBC: 2.75 MIL/uL — ABNORMAL LOW (ref 3.80–5.20)
RBC: 2.73 MIL/uL — AB (ref 3.80–5.20)
RDW: 13.4 % (ref 11.5–14.5)
RDW: 13.5 % (ref 11.5–14.5)
WBC: 10.3 10*3/uL (ref 3.6–11.0)
WBC: 9.9 10*3/uL (ref 3.6–11.0)

## 2018-02-07 MED ORDER — FERROUS SULFATE 325 (65 FE) MG PO TABS: 325.0000 mg | ORAL_TABLET | Freq: Three times a day (TID) | ORAL | Status: DC

## 2018-02-07 MED ORDER — MENTHOL 3 MG MT LOZG
1.0000 | LOZENGE | OROMUCOSAL | Status: DC | PRN
  Administered 2018-02-07 (×4): 3 mg via ORAL
  Filled 2018-02-07 (×2): qty 9

## 2018-02-07 NOTE — Anesthesia Postprocedure Evaluation (Signed)
Anesthesia Post Note  Patient: Clayborn BignessCedar B Woolsey  Procedure(s) Performed: AN AD HOC LABOR EPIDURAL  Patient location during evaluation: Mother Baby Anesthesia Type: Epidural Level of consciousness: awake and alert and oriented Pain management: pain level controlled Vital Signs Assessment: post-procedure vital signs reviewed and stable Respiratory status: spontaneous breathing Cardiovascular status: blood pressure returned to baseline Postop Assessment: no headache and no backache Anesthetic complications: no     Last Vitals:  Vitals:   02/07/18 1655 02/07/18 1700  BP: 134/72   Pulse:  88  Resp: 18   Temp: 37 C   SpO2:      Last Pain:  Vitals:   02/07/18 1655  TempSrc: Oral  PainSc:                  Rasha Ibe

## 2018-02-07 NOTE — Progress Notes (Signed)
Post Partum Day 1 Subjective: Doing well, no complaints.  Tolerating regular diet, pain with PO meds, voiding and ambulating without difficulty.  No CP SOB F/C N/V or leg pain no HA change of vision, RUQ/epigastric pain  Objective: BP 125/80 (BP Location: Right Arm)   Pulse 98   Temp 98.3 F (36.8 C) (Oral)   Resp 20   Ht 5\' 6"  (1.676 m)   Wt 90.3 kg (199 lb)   LMP 04/30/2017   SpO2 100%   BMI 32.12 kg/m   Normotensive x 12+ hours   Physical Exam:  General: NAD CV: RRR Pulm: nl effort, CTABL Lochia: moderate Uterine Fundus: fundus firm and below umbilicus DVT Evaluation: no cords, ttp LEs   Recent Labs    02/06/18 1534 02/07/18 0627  HGB 10.5* 8.7*  HCT 31.2* 26.4*  WBC 17.3* 10.3  PLT 173 129*    Assessment/Plan: 22 y.o. G1P0 postpartum day # 1 with preeclampsia and postpartum hemorrhage  1. Postpartum: routine cares 2. Preeclampsia:  No severe features 3. Acute blood loss anemia and thrombocytopenia: will likely recover without replacement products.  PO iron, will recheck q12 hours.  ----- Ranae Plumberhelsea Ward, MD Attending Obstetrician and Gynecologist [facility]

## 2018-02-08 ENCOUNTER — Encounter: Payer: Self-pay | Admitting: Emergency Medicine

## 2018-02-08 NOTE — Discharge Summary (Signed)
Obstetrical Discharge Summary  Patient Name: Rachel Ortega DOB: 08/04/1996 MRN: [id number]  Date of Admission: 02/05/2018 Date of Discharge: 02/08/2018  Gestational Age at Delivery: 6730w4d   Antepartum complications: PreE at term without severe features Admitting Diagnosis:  Secondary Diagnosis: Patient Active Problem List   Diagnosis Date Noted  . Acute blood loss anemia 02/07/2018  . Thrombocytopenia due to blood loss 02/07/2018  . Elevated blood pressure complicating pregnancy, antepartum, third trimester 02/05/2018  . Preeclampsia, third trimester 02/05/2018  . Pregnancy 10/24/2017  . family H/O inherited metabolic disease 40/98/119108/01/2017  . First trimester screening   . Supervision of normal first pregnancy, antepartum 07/10/2017    Complications: vaginal/sulcal laceration Date of Delivery: 02/06/17 Delivered By: Rachel GageJS Delivery Type: spontaneous vaginal delivery Newborn Data: Live born female  Birth Weight: 6 lb 10.2 oz (3010 g) APGAR: 8, 9  Newborn Delivery   Birth date/time:  02/06/2018 10:50:00 Delivery type:  Vaginal, Spontaneous       Discharge Physical Exam:  BP 121/60 (BP Location: Left Arm)   Pulse 98   Temp 98.2 F (36.8 C) (Oral)   Resp 18   Ht 5\' 6"  (1.676 m)   Wt 90.3 kg (199 lb)   LMP 04/30/2017   SpO2 99%   BMI 32.12 kg/m   General: NAD CV: RRR Pulm: CTABL, nl effort ABD: s/nd/nt, fundus firm and below the umbilicus Lochia: moderate DVT Evaluation: LE non-ttp, no evidence of DVT on exam.  Hemoglobin  Date Value Ref Range Status  02/07/2018 9.1 (L) 12.0 - 16.0 g/dL Final   HCT  Date Value Ref Range Status  02/07/2018 26.4 (L) 35.0 - 47.0 % Final    Post partum course: none Postpartum Procedures: none Disposition: stable, discharge to home. Baby Disposition: home with mom   Plan:  Rachel Ortega was discharged to home in good condition. [facility] with delivering provider   Discharge  Medications: Allergies as of 02/08/2018      Reactions   Sulfa Antibiotics       Medication List    TAKE these medications   docusate sodium 100 MG capsule Commonly known as:  COLACE Take 100 mg by mouth daily.   doxylamine (Sleep) 25 MG tablet Commonly known as:  UNISOM Take 25 mg by mouth at bedtime as needed.   ferrous sulfate 325 (65 FE) MG EC tablet Take 325 mg by mouth 3 (three) times daily with meals.   multivitamin tablet Take 1 tablet by mouth daily.         Signed: Christeen DouglasBethany Khadejah Ortega 02/08/18

## 2018-02-08 NOTE — Progress Notes (Signed)
Reviewed all patients discharge instructions and handouts regarding postpartum bleeding, no intercourse for 6 weeks, signs and symptoms of mastitis and postpartum bleu's. Reviewed discharge instructions for newborn regarding proper cord care, how and when to bathe the newborn, nail care, proper way to take the baby's temperature, along with safe sleep. All questions have been answered at this time. Patient discharged via wheelchair with axillary.  

## 2019-03-03 ENCOUNTER — Ambulatory Visit
Admission: EM | Admit: 2019-03-03 | Discharge: 2019-03-03 | Disposition: A | Discharge status: Home / Self Care | Payer: Medicaid Other | Attending: Family Medicine | Admitting: Family Medicine

## 2019-03-03 ENCOUNTER — Other Ambulatory Visit: Payer: Self-pay

## 2019-03-03 DIAGNOSIS — J0100 Acute maxillary sinusitis, unspecified: Secondary | ICD-10-CM

## 2019-03-03 MED ORDER — AMOXICILLIN 875 MG PO TABS: 875.0000 mg | ORAL_TABLET | Freq: Two times a day (BID) | ORAL | 0 refills | Status: DC

## 2019-03-03 MED ORDER — FLUTICASONE PROPIONATE 50 MCG/ACT NA SUSP: 2.0000 | Freq: Every day | NASAL | 0 refills | Status: DC

## 2019-03-03 MED ORDER — BENZONATATE 200 MG PO CAPS: 200.0000 mg | ORAL_CAPSULE | Freq: Three times a day (TID) | ORAL | 0 refills | Status: DC | PRN

## 2019-03-03 MED ORDER — FLUCONAZOLE 150 MG PO TABS: 150.0000 mg | ORAL_TABLET | Freq: Every day | ORAL | 0 refills | Status: DC

## 2019-03-03 NOTE — ED Triage Notes (Signed)
Patient complains of cough, congestion, body aches. Patient states that she has been having sinus pain and pressure x 1 week.

## 2019-03-03 NOTE — ED Provider Notes (Signed)
[facility]    CSN: [id number] Arrival date & time: 03/03/19  1128     History   Chief Complaint Chief Complaint  Patient presents with  . Cough    APPT    HPI Rachel Ortega is a 23 y.o. female.   The history is provided by the patient.  URI  Presenting symptoms: congestion, cough, facial pain and fatigue   Severity:  Moderate Onset quality:  Sudden Duration:  10 days Timing:  Constant Progression:  Unchanged Chronicity:  New Relieved by:  Nothing Ineffective treatments:  OTC medications Associated symptoms: sinus pain   Risk factors: sick contacts     Past Medical History:  Diagnosis Date  . Medical history non-contributory     Patient Active Problem List   Diagnosis Date Noted  . Acute blood loss anemia 02/07/2018  . Thrombocytopenia due to blood loss 02/07/2018  . Elevated blood pressure complicating pregnancy, antepartum, third trimester 02/05/2018  . Preeclampsia, third trimester 02/05/2018  . Pregnancy 10/24/2017  . family H/O inherited metabolic disease 40/98/1191  . First trimester screening   . Supervision of normal first pregnancy, antepartum 07/10/2017    Past Surgical History:  Procedure Laterality Date  . TONSILLECTOMY Bilateral   . WISDOM TOOTH EXTRACTION Bilateral     OB History    Gravida  1   Para      Term      Preterm      AB      Living  0     SAB      TAB      Ectopic      Multiple      Live Births               Home Medications    Prior to Admission medications   Medication Sig Start Date End Date Taking? Authorizing Provider  sertraline (ZOLOFT) 50 MG tablet Take 50 mg by mouth daily. 01/22/19  Yes [provider]  amoxicillin (AMOXIL) 875 MG tablet Take 1 tablet (875 mg total) by mouth 2 (two) times daily. 03/03/19   Payton Mccallum, MD  benzonatate (TESSALON) 200 MG capsule Take 1 capsule (200 mg total) by mouth 3 (three) times daily as needed. 03/03/19   Payton Mccallum, MD   docusate sodium (COLACE) 100 MG capsule Take 100 mg by mouth daily.    [provider]  doxylamine, Sleep, (UNISOM) 25 MG tablet Take 25 mg by mouth at bedtime as needed.    [provider]  ferrous sulfate 325 (65 FE) MG EC tablet Take 325 mg by mouth 3 (three) times daily with meals.    [provider]  fluconazole (DIFLUCAN) 150 MG tablet Take 1 tablet (150 mg total) by mouth daily. 03/03/19   Payton Mccallum, MD  fluticasone (FLONASE) 50 MCG/ACT nasal spray Place 2 sprays into both nostrils daily. 03/03/19   Payton Mccallum, MD  Multiple Vitamin (MULTIVITAMIN) tablet Take 1 tablet by mouth daily.    [provider]    Family History Family History  Problem Relation Age of Onset  . Lupus Mother   . Rheum arthritis Mother     Social History Social History   Tobacco Use  . Smoking status: Never Smoker  . Smokeless tobacco: Never Used  Substance Use Topics  . Alcohol use: No  . Drug use: No     Allergies   Sulfa antibiotics   Review of Systems Review of Systems  Constitutional: Positive for fatigue.  HENT: Positive for congestion and sinus pain.   Respiratory: Positive for cough.      Physical Exam Triage Vital Signs ED Triage Vitals  Enc Vitals Group     BP 03/03/19 1155 130/79     Pulse Rate 03/03/19 1155 (!) 102     Resp 03/03/19 1155 18     Temp 03/03/19 1155 98.5 F (36.9 C)     Temp Source 03/03/19 1155 Oral     SpO2 03/03/19 1155 100 %     Weight 03/03/19 1153 165 lb (74.8 kg)     Height 03/03/19 1153 5\' 6"  (1.676 m)     Head Circumference --      Peak Flow --      Pain Score 03/03/19 1152 0     Pain Loc --      Pain Edu? --      Excl. in GC? --    No data found.  Updated Vital Signs BP 130/79 (BP Location: Left Arm)   Pulse (!) 102   Temp 98.5 F (36.9 C) (Oral)   Resp 18   Ht 5\' 6"  (1.676 m)   Wt 74.8 kg   LMP 02/11/2019   SpO2 100%   Breastfeeding No   BMI 26.63 kg/m   Visual Acuity Right Eye  Distance:   Left Eye Distance:   Bilateral Distance:    Right Eye Near:   Left Eye Near:    Bilateral Near:     Physical Exam Vitals signs and nursing note reviewed.  Constitutional:      General: She is not in acute distress.    Appearance: She is well-developed. She is not toxic-appearing or diaphoretic.  HENT:     Head: Normocephalic and atraumatic.     Right Ear: Tympanic membrane, ear canal and external ear normal.     Left Ear: Tympanic membrane, ear canal and external ear normal.     Nose: Mucosal edema and rhinorrhea present. No nasal deformity, septal deviation or laceration.     Right Sinus: Maxillary sinus tenderness and frontal sinus tenderness present.     Left Sinus: Maxillary sinus tenderness and frontal sinus tenderness present.     Mouth/Throat:     Pharynx: Uvula midline. No oropharyngeal exudate.  Neck:     Musculoskeletal: Normal range of motion and neck supple.     Thyroid: No thyromegaly.  Cardiovascular:     Rate and Rhythm: Normal rate and regular rhythm.     Heart sounds: Normal heart sounds.  Pulmonary:     Effort: Pulmonary effort is normal. No respiratory distress.     Breath sounds: Normal breath sounds. No stridor. No wheezing, rhonchi or rales.  Lymphadenopathy:     Cervical: No cervical adenopathy.  Neurological:     Mental Status: She is alert.      UC Treatments / Results  Labs (all labs ordered are listed, but only abnormal results are displayed) Labs Reviewed - No data to display  EKG None  Radiology No results found.  Procedures Procedures (including critical care time)  Medications Ordered in UC Medications - No data to display  Initial Impression / Assessment and Plan / UC Course  I have reviewed the triage vital signs and the nursing notes.  Pertinent labs & imaging results that were available during my care of the patient were reviewed by me and considered in my medical decision making (see chart for details).       Final Clinical Impressions(s) /  UC Diagnoses   Final diagnoses:  Acute maxillary sinusitis, recurrence not specified    ED Prescriptions    Medication Sig Dispense Auth. Provider   amoxicillin (AMOXIL) 875 MG tablet Take 1 tablet (875 mg total) by mouth 2 (two) times daily. 20 tablet Payton Mccallum, MD   fluconazole (DIFLUCAN) 150 MG tablet Take 1 tablet (150 mg total) by mouth daily. 1 tablet Payton Mccallum, MD   fluticasone (FLONASE) 50 MCG/ACT nasal spray Place 2 sprays into both nostrils daily. 16 g Payton Mccallum, MD   benzonatate (TESSALON) 200 MG capsule Take 1 capsule (200 mg total) by mouth 3 (three) times daily as needed. 30 capsule Payton Mccallum, MD     1. diagnosis reviewed with patient 2. rx as per orders above; reviewed possible side effects, interactions, risks and benefits  3. Follow-up prn if symptoms worsen or don't improve   Controlled Substance Prescriptions [place] Controlled Substance Registry consulted? Not Applicable   Payton Mccallum, MD 03/03/19 1359

## 2019-05-27 ENCOUNTER — Other Ambulatory Visit: Payer: Self-pay

## 2019-05-27 ENCOUNTER — Encounter: Payer: Self-pay | Admitting: Emergency Medicine

## 2019-05-27 ENCOUNTER — Ambulatory Visit: Payer: Medicaid Other

## 2019-05-27 ENCOUNTER — Telehealth: Payer: Self-pay

## 2019-05-27 ENCOUNTER — Other Ambulatory Visit: Payer: Medicaid Other

## 2019-05-27 ENCOUNTER — Ambulatory Visit
Admission: EM | Admit: 2019-05-27 | Discharge: 2019-05-27 | Disposition: A | Discharge status: Home / Self Care | Payer: Medicaid Other | Attending: Emergency Medicine | Admitting: Emergency Medicine

## 2019-05-27 DIAGNOSIS — R05 Cough: Secondary | ICD-10-CM | POA: Diagnosis not present

## 2019-05-27 DIAGNOSIS — J029 Acute pharyngitis, unspecified: Secondary | ICD-10-CM | POA: Insufficient documentation

## 2019-05-27 DIAGNOSIS — R059 Cough, unspecified: Secondary | ICD-10-CM

## 2019-05-27 DIAGNOSIS — J0100 Acute maxillary sinusitis, unspecified: Secondary | ICD-10-CM | POA: Diagnosis present

## 2019-05-27 DIAGNOSIS — Z20822 Contact with and (suspected) exposure to covid-19: Secondary | ICD-10-CM

## 2019-05-27 DIAGNOSIS — Z7189 Other specified counseling: Secondary | ICD-10-CM | POA: Diagnosis present

## 2019-05-27 LAB — RAPID STREP SCREEN (MED CTR MEBANE ONLY): Streptococcus, Group A Screen (Direct): NEGATIVE

## 2019-05-27 MED ORDER — AMOXICILLIN-POT CLAVULANATE 875-125 MG PO TABS: 1.0000 | ORAL_TABLET | Freq: Two times a day (BID) | ORAL | 0 refills | Status: DC

## 2019-05-27 NOTE — Discharge Instructions (Addendum)
Take medication as prescribed. Rest. Drink plenty of fluids.  Over-the-counter congestion medication as needed.  Monitor self.  Please review [place] DHHS information given and adhere.  Remain home.  Await testing, and as discussed, strongly recommend following up with orange [facility] department for further guidance.  Follow up with your primary care physician this week as needed. Return to Urgent care for new or worsening concerns.

## 2019-05-27 NOTE — ED Notes (Signed)
Contacted [facility] to schedule COVID testing for patient. Appointment scheduled for 9:45am at the [facility]. Patient notified.

## 2019-05-27 NOTE — ED Triage Notes (Signed)
Patient c/o sore throat, cough and decreased energy that started 1 week ago. Denies fever. Patient reports confirmed COVID testing on 03/24/19.

## 2019-05-27 NOTE — ED Provider Notes (Signed)
[facility] ____________________________________________  Time seen: Approximately 8:41 AM  I have reviewed the triage vital signs and the nursing notes.   HISTORY  Chief Complaint Sore Throat and Cough   HPI Rachel Ortega is a 23 y.o. female presenting for evaluation of 1 week of nasal congestion, postnasal drainage, sore throat and occasional cough.  States cough is mostly due to postnasal drainage.  States having some sinus pain and pressure similar to previous sinus infections.  Symptoms unresolved with over-the-counter congestion medication.  Denies chest pain, shortness of breath, fevers.  Continues eat and drink well.  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  Patient was also tested positive for COVID-19 on April 1.  Patient states prior to now she has been asymptomatic and cleared and had went back to work.  Symptoms of been present for the last 1 week.  Patient's last menstrual period was 05/10/2019.  Denies pregnancy   Past Medical History:  Diagnosis Date  . Medical history non-contributory     Patient Active Problem List   Diagnosis Date Noted  . Acute blood loss anemia 02/07/2018  . Thrombocytopenia due to blood loss 02/07/2018  . Elevated blood pressure complicating pregnancy, antepartum, third trimester 02/05/2018  . Preeclampsia, third trimester 02/05/2018  . Pregnancy 10/24/2017  . family H/O inherited metabolic disease 99/83/3825  . First trimester screening   . Supervision of normal first pregnancy, antepartum 07/10/2017    Past Surgical History:  Procedure Laterality Date  . TONSILLECTOMY Bilateral   . WISDOM TOOTH EXTRACTION Bilateral      No current facility-administered medications for this encounter.   Current Outpatient Medications:  Marland Kitchen  Multiple Vitamin (MULTIVITAMIN) tablet, Take 1 tablet by mouth daily., Disp: , Rfl:  .  sertraline (ZOLOFT) 50 MG tablet, Take 50 mg by  mouth daily., Disp: , Rfl:  .  amoxicillin-clavulanate (AUGMENTIN) 875-125 MG tablet, Take 1 tablet by mouth every 12 (twelve) hours., Disp: 20 tablet, Rfl: 0  Allergies Sulfa antibiotics  Family History  Problem Relation Age of Onset  . Lupus Mother   . Rheum arthritis Mother     Social History Social History   Tobacco Use  . Smoking status: Never Smoker  . Smokeless tobacco: Never Used  Substance Use Topics  . Alcohol use: No  . Drug use: No    Review of Systems Constitutional: No fever ENT: Positive sore throat.  Positive nasal congestion. Cardiovascular: Denies chest pain. Respiratory: Denies shortness of breath. Gastrointestinal: No abdominal pain.  No nausea, no vomiting.  No diarrhea.  Genitourinary: Negative for dysuria. Musculoskeletal: Negative for back pain. Skin: Negative for rash.  ____________________________________________   PHYSICAL EXAM:  VITAL SIGNS: ED Triage Vitals  Enc Vitals Group     BP 05/27/19 0821 117/67     Pulse Rate 05/27/19 0821 75     Resp 05/27/19 0821 18     Temp 05/27/19 0821 98.3 F (36.8 C)     Temp Source 05/27/19 0821 Oral     SpO2 05/27/19 0821 99 %     Weight 05/27/19 0816 170 lb (77.1 kg)     Height 05/27/19 0816 5\' 6"  (1.676 m)     Head Circumference --      Peak Flow --      Pain Score 05/27/19 0816 5     Pain Loc --      Pain Edu? --      Excl. in  GC? --     Constitutional: Alert and oriented. Well appearing and in no acute distress. Eyes: Conjunctivae are normal.  Head: Atraumatic.Mild to moderate tenderness to palpation bilateral maxillary sinuses.  Minimal bilateral frontal sinus tenderness palpation.  No swelling. No erythema.   Ears: no erythema, normal TMs bilaterally.   Nose: nasal congestion with bilateral nasal turbinate erythema and edema.   Mouth/Throat: Mucous membranes are moist.  Oropharynx non-erythematous.No tonsillar swelling or exudate.  Neck: No stridor.  No cervical spine tenderness to  palpation. Hematological/Lymphatic/Immunilogical: No cervical lymphadenopathy. Cardiovascular: Normal rate, regular rhythm. Grossly normal heart sounds.  Good peripheral circulation. Respiratory: Normal respiratory effort.  No retractions. No wheezes, rales or rhonchi. Good air movement.  Musculoskeletal: Steady gait. Neurologic:  Normal speech and language. No gait instability. Skin:  Skin is warm, dry and intact. No rash noted. Psychiatric: Mood and affect are normal. Speech and behavior are normal. ___________________________________________   LABS (all labs ordered are listed, but only abnormal results are displayed)  Labs Reviewed  RAPID STREP SCREEN ([facility] ONLY)  CULTURE, GROUP A STREP [facility])    RADIOLOGY  Dg Chest 2 View  Result Date: 05/27/2019 CLINICAL DATA:  Positive for COVID-19.  Cough EXAM: CHEST - 2 VIEW COMPARISON:  None. FINDINGS: Normal heart size and mediastinal contours. No acute infiltrate or edema. No effusion or pneumothorax. No acute osseous findings. IMPRESSION: Negative chest. Electronically Signed   By: Marnee SpringJonathon  Watts M.D.   On: 05/27/2019 08:50   ____________________________________________   PROCEDURES Procedures    INITIAL IMPRESSION / ASSESSMENT AND PLAN / ED COURSE  Pertinent labs & imaging results that were available during my care of the patient were reviewed by me and considered in my medical decision making (see chart for details).  Well-appearing patient.  No acute distress.  Strep negative, will culture.  Chest x-ray as above negative.  Patient was tested positive for COVID-19 in April 1, but has since been cleared and had return back to work.  Patient does have direct positive COVID-19 patient exposures but with wearing PPE.  Patient has never been retested.  Current symptoms consistent for possible sinusitis, however discussed also possibility of COVID-19.  Will ret[facility] for COVID[facility] and [place] further guidance.  [place] DHHS information given and directed for patient to adhere those guidelines and will await testing result.  Work note given for at least 3 days pending test.  Will treat with oral Augmentin.  Discussed indication, risks and benefits of medications with patient.  Discussed follow up with Primary care physician this week. Discussed follow up and return parameters including no resolution or any worsening concerns. Patient verbalized understanding and agreed to plan.   ____________________________________________   FINAL CLINICAL IMPRESSION(S) / ED DIAGNOSES  Final diagnoses:  Acute maxillary sinusitis, recurrence not specified  Pharyngitis, unspecified etiology  Cough  Advice Given About Covid-19 Virus Infection     ED Discharge Orders         Ordered    amoxicillin-clavulanate (AUGMENTIN) 875-125 MG tablet  Every 12 hours     05/27/19 0909           Note: This dictation was prepared with Dragon dictation along with smaller phrase technology. Any transcriptional errors that result from this process are unintentional.         Renford DillsMiller, Kohen Reither, NP 05/27/19 [phone]

## 2019-05-27 NOTE — Telephone Encounter (Signed)
[facility] request COVID 19 test.

## 2019-05-29 LAB — NOVEL CORONAVIRUS, NAA: SARS-CoV-2, NAA: NOT DETECTED

## 2019-05-30 LAB — CULTURE, GROUP A STREP (THRC)

## 2019-06-23 ENCOUNTER — Encounter: Payer: Self-pay | Admitting: Emergency Medicine

## 2019-06-23 ENCOUNTER — Ambulatory Visit: Payer: Medicaid Other

## 2019-06-23 ENCOUNTER — Ambulatory Visit
Admission: EM | Admit: 2019-06-23 | Discharge: 2019-06-23 | Disposition: A | Discharge status: Home / Self Care | Payer: Medicaid Other | Attending: Emergency Medicine | Admitting: Emergency Medicine

## 2019-06-23 ENCOUNTER — Other Ambulatory Visit: Payer: Self-pay

## 2019-06-23 DIAGNOSIS — Z7189 Other specified counseling: Secondary | ICD-10-CM | POA: Insufficient documentation

## 2019-06-23 DIAGNOSIS — J069 Acute upper respiratory infection, unspecified: Secondary | ICD-10-CM | POA: Diagnosis not present

## 2019-06-23 LAB — RAPID STREP SCREEN (MED CTR MEBANE ONLY): Streptococcus, Group A Screen (Direct): NEGATIVE

## 2019-06-23 MED ORDER — BENZONATATE 200 MG PO CAPS: 200.0000 mg | ORAL_CAPSULE | Freq: Three times a day (TID) | ORAL | 0 refills | Status: DC | PRN

## 2019-06-23 MED ORDER — HYDROCOD POLST-CPM POLST ER 10-8 MG/5ML PO SUER: 5.0000 mL | Freq: Every evening | ORAL | 0 refills | Status: DC | PRN

## 2019-06-23 NOTE — ED Provider Notes (Signed)
[facility] ____________________________________________  Time seen: Approximately 4:53 PM  I have reviewed the triage vital signs and the nursing notes.   HISTORY  Chief Complaint Cough and Sore Throat   HPI Rachel Ortega is a 23 y.o. female presenting for evaluation of approximately 1 week of cough, sore throat, postnasal drainage.  Reports some subjective low-grade fevers.  Reports occasional chest tightness and soreness from coughing.  Denies chest pain.  States rare shortness of breath episodes, mostly attributed to coughing.  Cough occasionally productive.  Has tried over-the-counter cough and congestion medication without resolution.  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  Patient was tested positive for COVID-19 in April.  Patient was re-seen in June and tested negative.  Patient had a recent sinus infection was treated with antibiotics and much improved from that visit, until current sickness.  States sore throat is hoarse as well as painful to swallow.  Continues eat and drink well.  Denies vomiting, diarrhea, abdominal pain, dysuria, rash, chest pain or current shortness of breath.  Patient's last menstrual period was 06/06/2019 (approximate).Denies pregnancy.   Past Medical History:  Diagnosis Date  . Medical history non-contributory     Patient Active Problem List   Diagnosis Date Noted  . Acute blood loss anemia 02/07/2018  . Thrombocytopenia due to blood loss 02/07/2018  . Elevated blood pressure complicating pregnancy, antepartum, third trimester 02/05/2018  . Preeclampsia, third trimester 02/05/2018  . Pregnancy 10/24/2017  . family H/O inherited metabolic disease 15/40/0867  . First trimester screening   . Supervision of normal first pregnancy, antepartum 07/10/2017    Past Surgical History:  Procedure Laterality Date  . TONSILLECTOMY Bilateral   . WISDOM TOOTH EXTRACTION Bilateral      No current  facility-administered medications for this encounter.   Current Outpatient Medications:  .  sertraline (ZOLOFT) 50 MG tablet, Take 50 mg by mouth daily., Disp: , Rfl:  .  amoxicillin-clavulanate (AUGMENTIN) 875-125 MG tablet, Take 1 tablet by mouth every 12 (twelve) hours., Disp: 20 tablet, Rfl: 0 .  benzonatate (TESSALON) 200 MG capsule, Take 1 capsule (200 mg total) by mouth 3 (three) times daily as needed for cough., Disp: 20 capsule, Rfl: 0 .  chlorpheniramine-HYDROcodone (TUSSIONEX PENNKINETIC ER) 10-8 MG/5ML SUER, Take 5 mLs by mouth at bedtime as needed. do not drive or operate machinery while taking as can cause drowsiness., Disp: 30 mL, Rfl: 0 .  Multiple Vitamin (MULTIVITAMIN) tablet, Take 1 tablet by mouth daily., Disp: , Rfl:   Allergies Sulfa antibiotics  Family History  Problem Relation Age of Onset  . Lupus Mother   . Rheum arthritis Mother     Social History Social History   Tobacco Use  . Smoking status: Never Smoker  . Smokeless tobacco: Never Used  Substance Use Topics  . Alcohol use: No  . Drug use: No    Review of Systems Constitutional: Possible fevers.  ENT: Positive sore throat. Cardiovascular: Denies chest pain. Respiratory: Denies current shortness of breath. As above. Gastrointestinal: No abdominal pain.   Genitourinary: Negative for dysuria. Musculoskeletal: Negative for back pain. Skin: Negative for rash.   ____________________________________________   PHYSICAL EXAM:  VITAL SIGNS: ED Triage Vitals  Enc Vitals Group     BP 06/23/19 1627 114/75     Pulse Rate 06/23/19 1627 91     Resp 06/23/19 1627 18     Temp 06/23/19 1627 99 F (37.2 C)     Temp Source  06/23/19 1627 Oral     SpO2 06/23/19 1627 100 %     Weight 06/23/19 1623 165 lb (74.8 kg)     Height 06/23/19 1623 5\' 6"  (1.676 m)     Head Circumference --      Peak Flow --      Pain Score 06/23/19 1623 8     Pain Loc --      Pain Edu? --      Excl. in [place]? --      Constitutional: Alert and oriented. Well appearing and in no acute distress. Eyes: Conjunctivae are normal.  Head: Atraumatic. No sinus tenderness to palpation. No swelling. No erythema.  Ears: no erythema, normal TMs bilaterally.   Nose:Nasal congestion with clear rhinorrhea  Mouth/Throat: Mucous membranes are moist. Mild pharyngeal erythema. No tonsillar swelling or exudate.  Neck: No stridor.  No cervical spine tenderness to palpation. Hematological/Lymphatic/Immunilogical: No cervical lymphadenopathy. Cardiovascular: Normal rate, regular rhythm. Grossly normal heart sounds.  Good peripheral circulation. Respiratory: Normal respiratory effort.  No retractions. No wheezes, rales or rhonchi. Good air movement.  Gastrointestinal: Soft and nontender.  Musculoskeletal: Ambulatory with steady gait.  Neurologic:  Normal speech and language. No gait instability. Skin:  Skin appears warm, dry and intact. No rash noted. Psychiatric: Mood and affect are normal. Speech and behavior are normal.  ___________________________________________   LABS (all labs ordered are listed, but only abnormal results are displayed)  Labs Reviewed  RAPID STREP SCREEN ([facility] ONLY)  CULTURE, GROUP A STREP [facility])   ____________________________________________  RADIOLOGY  Dg Chest 2 View  Result Date: 06/23/2019 CLINICAL DATA:  Sore throat, chest tightness, fatigue. Cough and fever. EXAM: CHEST - 2 VIEW COMPARISON:  05/27/2019 FINDINGS: The heart size and mediastinal contours are within normal limits. Both lungs are clear. The visualized skeletal structures are unremarkable. IMPRESSION: No active cardiopulmonary disease. Electronically Signed   By: Gaylyn RongWalter  Liebkemann M.D.   On: 06/23/2019 17:22   ____________________________________________   PROCEDURES Procedures    INITIAL IMPRESSION / ASSESSMENT AND PLAN / ED COURSE  Pertinent labs & imaging results that were available during my care of the  patient were reviewed by me and considered in my medical decision making (see chart for details).  Well-appearing patient.  No acute distress.  Suspect viral illness.  Chest x-ray negative.  Strep negative, will culture.  Recommend ordering COVID-19 test.  Patient states that she has an outpatient COVID-19 test already scheduled for tomorrow through her work and does not want any additional COVID-19 testing ordered.  Patient recommended to make sure she has COVID-19 testing and to remain home until testing result received.  Discussed reevaluation concerns as needed.  PRN Tessalon Perles and Tussionex.  Encourage rest, fluids, supportive care.Discussed indication, risks and benefits of medications with patient.[place] DHHS covid 19 information given and instructed to follow.   Discussed follow up with Primary care physician this week. Discussed follow up and return parameters including no resolution or any worsening concerns. Patient verbalized understanding and agreed to plan.   ____________________________________________   FINAL CLINICAL IMPRESSION(S) / ED DIAGNOSES  Final diagnoses:  Upper respiratory tract infection, unspecified type  Advice Given About Covid-19 Virus Infection     ED Discharge Orders         Ordered    benzonatate (TESSALON) 200 MG capsule  3 times daily PRN     06/23/19 1738    chlorpheniramine-HYDROcodone (TUSSIONEX PENNKINETIC ER) 10-8 MG/5ML SUER  At bedtime PRN  06/23/19 1738           Note: This dictation was prepared with Dragon dictation along with smaller phrase technology. Any transcriptional errors that result from this process are unintentional.         Renford DillsMiller, Lenetta Piche, NP 06/23/19 1744

## 2019-06-23 NOTE — ED Triage Notes (Signed)
Pt c/o cough, sore throat, chest tightness, fatigue. She states that it started back about a week ago. She has had COVID and was tested again in June and was negative. She states her throat feels raw from coughing and she is hoarse. She states that her 99.0 temp is a temperature for her.

## 2019-06-23 NOTE — Discharge Instructions (Signed)
Take medication as prescribed. Rest. Drink plenty of fluids. Monitor self closely.   Covid 19 testing as discussed.  Please refer to [facility] DHHS information and guidelines and follow.  Remain home until further directed.  Follow up with your primary care physician this week as needed. Return to Urgent care for new or worsening concerns.

## 2019-06-26 LAB — CULTURE, GROUP A STREP (THRC)

## 2019-10-06 ENCOUNTER — Ambulatory Visit
Admission: EM | Admit: 2019-10-06 | Discharge: 2019-10-06 | Disposition: A | Discharge status: Home / Self Care | Payer: Medicaid Other | Attending: Family Medicine | Admitting: Family Medicine

## 2019-10-06 ENCOUNTER — Encounter: Payer: Self-pay | Admitting: Emergency Medicine

## 2019-10-06 ENCOUNTER — Other Ambulatory Visit: Payer: Self-pay

## 2019-10-06 DIAGNOSIS — H1089 Other conjunctivitis: Secondary | ICD-10-CM | POA: Diagnosis not present

## 2019-10-06 MED ORDER — MOXIFLOXACIN HCL 0.5 % OP SOLN: 1.0000 [drp] | Freq: Three times a day (TID) | OPHTHALMIC | 0 refills | Status: DC

## 2019-10-06 NOTE — ED Triage Notes (Signed)
Patient c/o right eye irritation, redness and drainage that started yesterday.

## 2019-10-06 NOTE — ED Provider Notes (Signed)
[facility]    CSN: [id number] Arrival date & time: 10/06/19  1729      History   Chief Complaint Chief Complaint  Patient presents with  . Eye Problem    APPT    HPI Rachel Ortega is a 23 y.o. female.   23 yo female with a c/o right eye irritation, redness, drainage and waking up with crusty eye since yesterday. Denies any trauma or foreign body sensation. States she has not used contact lenses for the past month.    Eye Problem   Past Medical History:  Diagnosis Date  . Medical history non-contributory     Patient Active Problem List   Diagnosis Date Noted  . Acute blood loss anemia 02/07/2018  . Thrombocytopenia due to blood loss 02/07/2018  . Elevated blood pressure complicating pregnancy, antepartum, third trimester 02/05/2018  . Preeclampsia, third trimester 02/05/2018  . Pregnancy 10/24/2017  . family H/O inherited metabolic disease 20/25/4270  . First trimester screening   . Supervision of normal first pregnancy, antepartum 07/10/2017    Past Surgical History:  Procedure Laterality Date  . TONSILLECTOMY Bilateral   . WISDOM TOOTH EXTRACTION Bilateral     OB History    Gravida  1   Para      Term      Preterm      AB      Living  0     SAB      TAB      Ectopic      Multiple      Live Births               Home Medications    Prior to Admission medications   Medication Sig Start Date End Date Taking? Authorizing Provider  sertraline (ZOLOFT) 50 MG tablet Take 50 mg by mouth daily. 01/22/19  Yes [provider]  amoxicillin-clavulanate (AUGMENTIN) 875-125 MG tablet Take 1 tablet by mouth every 12 (twelve) hours. 05/27/19   Renford Dills, NP  benzonatate (TESSALON) 200 MG capsule Take 1 capsule (200 mg total) by mouth 3 (three) times daily as needed for cough. 06/23/19   Renford Dills, NP  chlorpheniramine-HYDROcodone [facility] PENNKINETIC ER) 10-8 MG/5ML SUER Take 5 mLs by mouth at bedtime as needed. do  not drive or operate machinery while taking as can cause drowsiness. 06/23/19   Renford Dills, NP  moxifloxacin (VIGAMOX) 0.5 % ophthalmic solution Place 1 drop into the right eye 3 (three) times daily. 10/06/19   Payton Mccallum, MD  Multiple Vitamin (MULTIVITAMIN) tablet Take 1 tablet by mouth daily.    [provider]    Family History Family History  Problem Relation Age of Onset  . Lupus Mother   . Rheum arthritis Mother     Social History Social History   Tobacco Use  . Smoking status: Never Smoker  . Smokeless tobacco: Never Used  Substance Use Topics  . Alcohol use: No  . Drug use: No     Allergies   Sulfa antibiotics   Review of Systems Review of Systems   Physical Exam Triage Vital Signs ED Triage Vitals  Enc Vitals Group     BP 10/06/19 1756 120/80     Pulse Rate 10/06/19 1756 91     Resp 10/06/19 1756 18     Temp 10/06/19 1756 98.8 F (37.1 C)     Temp Source 10/06/19 1756 Oral     SpO2 10/06/19 1756 99 %  Weight 10/06/19 1754 165 lb (74.8 kg)     Height 10/06/19 1754 5\' 6"  (1.676 m)     Head Circumference --      Peak Flow --      Pain Score 10/06/19 1753 0     Pain Loc --      Pain Edu? --      Excl. in [place]? --    No data found.  Updated Vital Signs BP 120/80 (BP Location: Right Arm)   Pulse 91   Temp 98.8 F (37.1 C) (Oral)   Resp 18   Ht 5\' 6"  (1.676 m)   Wt 74.8 kg   LMP 09/04/2019   SpO2 99%   BMI 26.63 kg/m   Visual Acuity Right Eye Distance:   Left Eye Distance:   Bilateral Distance:    Right Eye Near:   Left Eye Near:    Bilateral Near:     Physical Exam Vitals signs and nursing note reviewed.  Constitutional:      General: She is not in acute distress.    Appearance: She is not toxic-appearing.  Eyes:     General:        Right eye: Discharge present.        Left eye: No discharge.     Conjunctiva/sclera:     Right eye: Right conjunctiva is injected.     Left eye: Left conjunctiva is not injected. No  exudate.    Pupils: Pupils are equal, round, and reactive to light.  Neurological:     Mental Status: She is alert.      UC Treatments / Results  Labs (all labs ordered are listed, but only abnormal results are displayed) Labs Reviewed - No data to display  EKG   Radiology No results found.  Procedures Procedures (including critical care time)  Medications Ordered in UC Medications - No data to display  Initial Impression / Assessment and Plan / UC Course  I have reviewed the triage vital signs and the nursing notes.  Pertinent labs & imaging results that were available during my care of the patient were reviewed by me and considered in my medical decision making (see chart for details).      Final Clinical Impressions(s) / UC Diagnoses   Final diagnoses:  Other conjunctivitis of right eye    ED Prescriptions    Medication Sig Dispense Auth. Provider   moxifloxacin (VIGAMOX) 0.5 % ophthalmic solution Place 1 drop into the right eye 3 (three) times daily. 3 mL Norval Gable, MD      1. diagnosis reviewed with patient 2. rx as per orders above; reviewed possible side effects, interactions, risks and benefits  3. Recommend supportive treatment with cool compresses 4. Follow-up prn if symptoms worsen or don't improve   PDMP not reviewed this encounter.   Norval Gable, MD 10/06/19 1930

## 2019-12-06 ENCOUNTER — Ambulatory Visit
Admission: EM | Admit: 2019-12-06 | Discharge: 2019-12-06 | Disposition: A | Discharge status: Home / Self Care | Payer: BC Managed Care – PPO | Attending: Family Medicine | Admitting: Family Medicine

## 2019-12-06 ENCOUNTER — Other Ambulatory Visit: Payer: Self-pay

## 2019-12-06 ENCOUNTER — Encounter: Payer: Self-pay | Admitting: Emergency Medicine

## 2019-12-06 DIAGNOSIS — Z113 Encounter for screening for infections with a predominantly sexual mode of transmission: Secondary | ICD-10-CM

## 2019-12-06 DIAGNOSIS — B9689 Other specified bacterial agents as the cause of diseases classified elsewhere: Secondary | ICD-10-CM

## 2019-12-06 DIAGNOSIS — N760 Acute vaginitis: Secondary | ICD-10-CM | POA: Diagnosis not present

## 2019-12-06 HISTORY — DX: Depression, unspecified: F32.A

## 2019-12-06 LAB — WET PREP, GENITAL
Sperm: NONE SEEN
Trich, Wet Prep: NONE SEEN
Yeast Wet Prep HPF POC: NONE SEEN

## 2019-12-06 MED ORDER — METRONIDAZOLE 0.75 % VA GEL: 1.0000 | Freq: Every day | VAGINAL | 0 refills | Status: AC

## 2019-12-06 NOTE — Discharge Instructions (Addendum)
Take medication as prescribed. Rest. Drink plenty of fluids. Pelvic rest.  ° °Follow up with your primary care physician this week as needed. Return to Urgent care for new or worsening concerns.  ° °

## 2019-12-06 NOTE — ED Provider Notes (Signed)
[facility] ____________________________________________  Time seen: Approximately 6:30 PM  I have reviewed the triage vital signs and the nursing notes.   HISTORY  Chief Complaint Vaginal Discharge (APPT)   HPI Rachel Ortega is a 22 y.o. female presenting for evaluation of 2 days of whitish vaginal discharge with odor.  Patient states is consistent with her previous bacterial vaginosis.  Patient also request to have gonorrhea and Chlamydia testing conservatively.  States not highly concerns of STDs and declines other STD testing.  Denies dysuria, abdominal pain, back pain, fevers, rash or lesions.  Reports otherwise doing well.  Denies aggravating alleviating factors.  Patient's last menstrual period was 11/09/2019 (exact date).  Denies pregnancy.    Past Medical History:  Diagnosis Date  . Depression   . Medical history non-contributory     Patient Active Problem List   Diagnosis Date Noted  . Acute blood loss anemia 02/07/2018  . Thrombocytopenia due to blood loss 02/07/2018  . Elevated blood pressure complicating pregnancy, antepartum, third trimester 02/05/2018  . Preeclampsia, third trimester 02/05/2018  . Pregnancy 10/24/2017  . family H/O inherited metabolic disease 78/29/5621  . First trimester screening   . Supervision of normal first pregnancy, antepartum 07/10/2017    Past Surgical History:  Procedure Laterality Date  . TONSILLECTOMY Bilateral   . TONSILLECTOMY    . WISDOM TOOTH EXTRACTION Bilateral      No current facility-administered medications for this encounter.  Current Outpatient Medications:  .  sertraline (ZOLOFT) 50 MG tablet, Take 50 mg by mouth daily., Disp: , Rfl:  .  metroNIDAZOLE (METROGEL) 0.75 % vaginal gel, Place 1 Applicatorful vaginally at bedtime for 5 days. For 5 days, Disp: 70 g, Rfl: 0  Allergies Sulfa antibiotics  Family History  Problem Relation Age of Onset  . Lupus Mother   . Rheum arthritis Mother   .  Healthy Father     Social History Social History   Tobacco Use  . Smoking status: Never Smoker  . Smokeless tobacco: Never Used  Substance Use Topics  . Alcohol use: Yes    Comment: social  . Drug use: No    Review of Systems Constitutional: No fever ENT: No sore throat. Cardiovascular: Denies chest pain. Respiratory: Denies shortness of breath. Gastrointestinal: No abdominal pain.  No nausea, no vomiting.  No diarrhea.   Genitourinary: Negative for dysuria. As above.  Musculoskeletal: Negative for back pain. Skin: Negative for rash.   ____________________________________________   PHYSICAL EXAM:  VITAL SIGNS: ED Triage Vitals  Enc Vitals Group     BP 12/06/19 1609 124/71     Pulse Rate 12/06/19 1609 87     Resp 12/06/19 1609 16     Temp 12/06/19 1609 98.4 F (36.9 C)     Temp Source 12/06/19 1609 Oral     SpO2 12/06/19 1609 100 %     Weight 12/06/19 1610 165 lb (74.8 kg)     Height 12/06/19 1610 5\' 6"  (1.676 m)     Head Circumference --      Peak Flow --      Pain Score 12/06/19 1610 0     Pain Loc --      Pain Edu? --      Excl. in GC? --     Constitutional: Alert and oriented. Well appearing and in no acute distress. Eyes: Conjunctivae are normal.  ENT      Head: Normocephalic and atraumatic. Cardiovascular: Normal rate, regular rhythm. Grossly normal heart sounds.  Good peripheral circulation. Respiratory: Normal respiratory effort without tachypnea nor retractions. Breath sounds are clear and equal bilaterally. No wheezes, rales, rhonchi. Gastrointestinal: Soft and nontender.  No CVA tenderness. Musculoskeletal: No midline cervical, thoracic or lumbar tenderness to palpation.  Neurologic:  Normal speech and language. Speech is normal. No gait instability.  Skin:  Skin is warm, dry and intact. No rash noted. Psychiatric: Mood and affect are normal. Speech and behavior are normal. Patient exhibits appropriate insight and judgment     ___________________________________________   LABS (all labs ordered are listed, but only abnormal results are displayed)  Labs Reviewed  WET PREP, GENITAL - Abnormal; Notable for the following components:      Result Value   Clue Cells Wet Prep HPF POC PRESENT (*)    WBC, Wet Prep HPF POC MODERATE (*)    All other components within normal limits  GC/CHLAMYDIA PROBE AMP     PROCEDURES Procedures   INITIAL IMPRESSION / ASSESSMENT AND PLAN / ED COURSE  Pertinent labs & imaging results that were available during my care of the patient were reviewed by me and considered in my medical decision making (see chart for details).  Well-appearing patient.  No acute distress.  Patient elected for self wet prep.  Wet prep positive for clue cells.  GC chlamydia sent.  Discussed treatment options with patient, will treat with metronidazole gel.  Encourage pelvic rest, supportive care fluids.  Follow-up with OB/GYN or primary as needed.Discussed indication, risks and benefits of medications with patient.   Discussed follow up and return parameters including no resolution or any worsening concerns. Patient verbalized understanding and agreed to plan.   ____________________________________________   FINAL CLINICAL IMPRESSION(S) / ED DIAGNOSES  Final diagnoses:  BV (bacterial vaginosis)  Screen for STD (sexually transmitted disease)     ED Discharge Orders         Ordered    metroNIDAZOLE (METROGEL) 0.75 % vaginal gel  Daily at bedtime     12/06/19 1654           Note: This dictation was prepared with Dragon dictation along with smaller phrase technology. Any transcriptional errors that result from this process are unintentional.         Marylene Land, NP 12/06/19 2033

## 2019-12-06 NOTE — ED Triage Notes (Signed)
Patient in today c/o vaginal discharge and odor x 2 days. Patient does want to be tested for GC/Chlam today.

## 2019-12-07 LAB — GC/CHLAMYDIA PROBE AMP
Chlamydia trachomatis, NAA: POSITIVE — AB
Neisseria Gonorrhoeae by PCR: NEGATIVE

## 2019-12-09 ENCOUNTER — Telehealth: Payer: Self-pay | Admitting: Emergency Medicine

## 2019-12-09 MED ORDER — AZITHROMYCIN 250 MG PO TABS: 1000.0000 mg | ORAL_TABLET | Freq: Once | ORAL | 0 refills | Status: AC

## 2019-12-09 NOTE — Telephone Encounter (Signed)
Chlamydia is positive.  Rx po zithromax 1g #1 dose no refills was sent to the pharmacy of record.  Pt needs education to please refrain from sexual intercourse for 7 days to give the medicine time to work, sexual partners need to be notified and tested/treated.  Condoms may reduce risk of reinfection.  Recheck or followup with PCP for further evaluation if symptoms are not improving.   [facility] notified.  Patient contacted by phone and made aware of    results. Pt verbalized understanding and had all questions answered.    

## 2020-06-18 ENCOUNTER — Encounter: Payer: Self-pay | Admitting: Emergency Medicine

## 2020-06-18 ENCOUNTER — Ambulatory Visit
Admission: EM | Admit: 2020-06-18 | Discharge: 2020-06-18 | Disposition: A | Discharge status: Home / Self Care | Payer: BC Managed Care – PPO | Attending: Emergency Medicine | Admitting: Emergency Medicine

## 2020-06-18 ENCOUNTER — Other Ambulatory Visit: Payer: Self-pay

## 2020-06-18 DIAGNOSIS — H02845 Edema of left lower eyelid: Secondary | ICD-10-CM | POA: Diagnosis not present

## 2020-06-18 DIAGNOSIS — H02842 Edema of right lower eyelid: Secondary | ICD-10-CM | POA: Diagnosis not present

## 2020-06-18 DIAGNOSIS — H0289 Other specified disorders of eyelid: Secondary | ICD-10-CM | POA: Diagnosis not present

## 2020-06-18 DIAGNOSIS — H02841 Edema of right upper eyelid: Secondary | ICD-10-CM

## 2020-06-18 DIAGNOSIS — H02844 Edema of left upper eyelid: Secondary | ICD-10-CM | POA: Diagnosis not present

## 2020-06-18 MED ORDER — FLUCONAZOLE 150 MG PO TABS: 150.0000 mg | ORAL_TABLET | Freq: Once | ORAL | 1 refills | Status: AC

## 2020-06-18 MED ORDER — DOXYCYCLINE HYCLATE 100 MG PO CAPS: 100.0000 mg | ORAL_CAPSULE | Freq: Two times a day (BID) | ORAL | 0 refills | Status: AC

## 2020-06-18 MED ORDER — IBUPROFEN 600 MG PO TABS: 600.0000 mg | ORAL_TABLET | Freq: Four times a day (QID) | ORAL | 0 refills | Status: DC | PRN

## 2020-06-18 MED ORDER — HYDROCORTISONE 2.5 % EX OINT: TOPICAL_OINTMENT | Freq: Two times a day (BID) | CUTANEOUS | 0 refills | Status: AC

## 2020-06-18 NOTE — Discharge Instructions (Addendum)
Apply the hydrocortisone as written as needed.  Should help with the itching.  May take the ibuprofen with the 1000 mg of Tylenol 3-4 times a day as needed for pain, swelling.  Finish the antibiotics, even if you feel better.  Cool compresses.  Follow-up with Dr. Brooke Dare if not getting better in several days.

## 2020-06-18 NOTE — ED Provider Notes (Signed)
HPI  SUBJECTIVE:  Rachel Ortega is a 24 y.o. female who presents with bilateral intensely itchy, painful upper and lower eyelids starting yesterday.  States that she woke up with it.  She reports swelling, worse on the lower eyelids.  She reports some photophobia, mild headache, crusting in the morning.  No discharge, blurry or double vision, fevers, nasal congestion, sneezing, allergy symptoms, new lotions, detergents, chemical exposures.  No change in her make-up, contact lens solution.  This has never happened before.  She wears glasses and contacts.  She tried Clear Eyes which made her symptoms worse.  No alleviating factors.  She states her symptoms are also aggravated with light, palpation.  Past medical history negative for allergies, diabetes, hypertension, MRSA infections.  LMP: 6/15.  Denies the possibility of being pregnant.  PMD:  Miquel Dunn at [facility] primary care.  Ophthalmology: Unable to remember.    Past Medical History:  Diagnosis Date  . Depression   . Medical history non-contributory     Past Surgical History:  Procedure Laterality Date  . TONSILLECTOMY Bilateral   . TONSILLECTOMY    . WISDOM TOOTH EXTRACTION Bilateral     Family History  Problem Relation Age of Onset  . Lupus Mother   . Rheum arthritis Mother   . Healthy Father     Social History   Tobacco Use  . Smoking status: Never Smoker  . Smokeless tobacco: Never Used  Vaping Use  . Vaping Use: Never used  Substance Use Topics  . Alcohol use: Yes    Comment: social  . Drug use: No    No current facility-administered medications for this encounter.  Current Outpatient Medications:  .  sertraline (ZOLOFT) 50 MG tablet, Take 50 mg by mouth daily., Disp: , Rfl:  .  doxycycline (VIBRAMYCIN) 100 MG capsule, Take 1 capsule (100 mg total) by mouth 2 (two) times daily for 5 days., Disp: 10 capsule, Rfl: 0 .  fluconazole (DIFLUCAN) 150 MG tablet, Take 1 tablet (150 mg total) by mouth once for 1 dose. 1  tab po x 1. May repeat in 72 hours if no improvement, Disp: 2 tablet, Rfl: 1 .  hydrocortisone 2.5 % ointment, Apply topically 2 (two) times daily for 14 days., Disp: 30 g, Rfl: 0 .  ibuprofen (ADVIL) 600 MG tablet, Take 1 tablet (600 mg total) by mouth every 6 (six) hours as needed., Disp: 30 tablet, Rfl: 0  Allergies  Allergen Reactions  . Sulfa Antibiotics      ROS  As noted in HPI.   Physical Exam  BP 119/77 (BP Location: Right Arm)   Pulse 71   Temp 98 F (36.7 C) (Oral)   Resp 14   Ht 5\' 6"  (1.676 m)   Wt 84.6 kg   LMP 06/04/2020 (Approximate)   SpO2 100%   BMI 30.12 kg/m   Constitutional: Well developed, well nourished, no acute distress Eyes:  EOMI, conjunctiva normal bilaterally.  No pain with EOMs.  No evidence of entrapment.  No purulent discharge.  No direct or consensual photophobia.  Positive upper and lower eyelid erythema, tenderness.  No periorbital erythema, edema.  No foreign body seen on lid eversion.  No abrasion seen on fluorescein exam.    Visual Acuity  Right Eye Distance: 20/25 corrected Left Eye Distance: 20/25 corrected Bilateral Distance: 20/20 corrected  Right Eye Near:   Left Eye Near:    Bilateral Near:        HENT: Normocephalic, atraumatic,mucus membranes moist  Respiratory: Normal inspiratory effort Cardiovascular: Normal rate GI: nondistended skin: No rash, skin intact Musculoskeletal: no deformities Neurologic: Alert & oriented x 3, no focal neuro deficits Psychiatric: Speech and behavior appropriate   ED Course   Medications - No data to display  No orders of the defined types were placed in this encounter.   No results found for this or any previous visit (from the past 24 hour(s)). No results found.  ED Clinical Impression  1. Pain and swelling of lower eyelid of both eyes   2. Pain and swelling of upper eyelid of both eyes      ED Assessment/Plan  Unsure as to the etiology of the patient's symptoms however  there seems to be a strong inflammatory component to it.  Concern for secondary cellulitis although I do not think that this is periorbital cellulitis,  post septal cellulitis as it is bilateral.  Will send home with hydrocortisone ointment for the eyelids, doxycycline 100 mg p.o. twice daily for 5 days.  Cool compresses.  follow-up with Dr. Brooke Dare, ophthalmology on-call in 2 to 3 days if not better.  To the ER if she gets worse.  Patient requesting Diflucan as she states that she always gets yeast infections with antibiotics Work note for 2 days.  Discussed  MDM, treatment plan, and plan for follow-up with patient. Discussed sn/sx that should prompt return to the ED. patient agrees with plan.   Meds ordered this encounter  Medications  . hydrocortisone 2.5 % ointment    Sig: Apply topically 2 (two) times daily for 14 days.    Dispense:  30 g    Refill:  0  . doxycycline (VIBRAMYCIN) 100 MG capsule    Sig: Take 1 capsule (100 mg total) by mouth 2 (two) times daily for 5 days.    Dispense:  10 capsule    Refill:  0  . ibuprofen (ADVIL) 600 MG tablet    Sig: Take 1 tablet (600 mg total) by mouth every 6 (six) hours as needed.    Dispense:  30 tablet    Refill:  0  . fluconazole (DIFLUCAN) 150 MG tablet    Sig: Take 1 tablet (150 mg total) by mouth once for 1 dose. 1 tab po x 1. May repeat in 72 hours if no improvement    Dispense:  2 tablet    Refill:  1    *This clinic note was created using Scientist, clinical (histocompatibility and immunogenetics). Therefore, there may be occasional mistakes despite careful proofreading.   ?    Domenick Gong, MD 06/19/20 [phone]

## 2020-06-18 NOTE — ED Triage Notes (Signed)
Patient c/o itchy and sore eyes that started yesterday.  Patient has not tried any OTC medicine.

## 2020-07-15 IMAGING — CR CHEST - 2 VIEW
2 series · 2 of 2 positions shown · non-contrast
Comparison: None.

CLINICAL DATA: Positive for [id number].  Cough

EXAM:
CHEST - 2 VIEW

[chest pa]
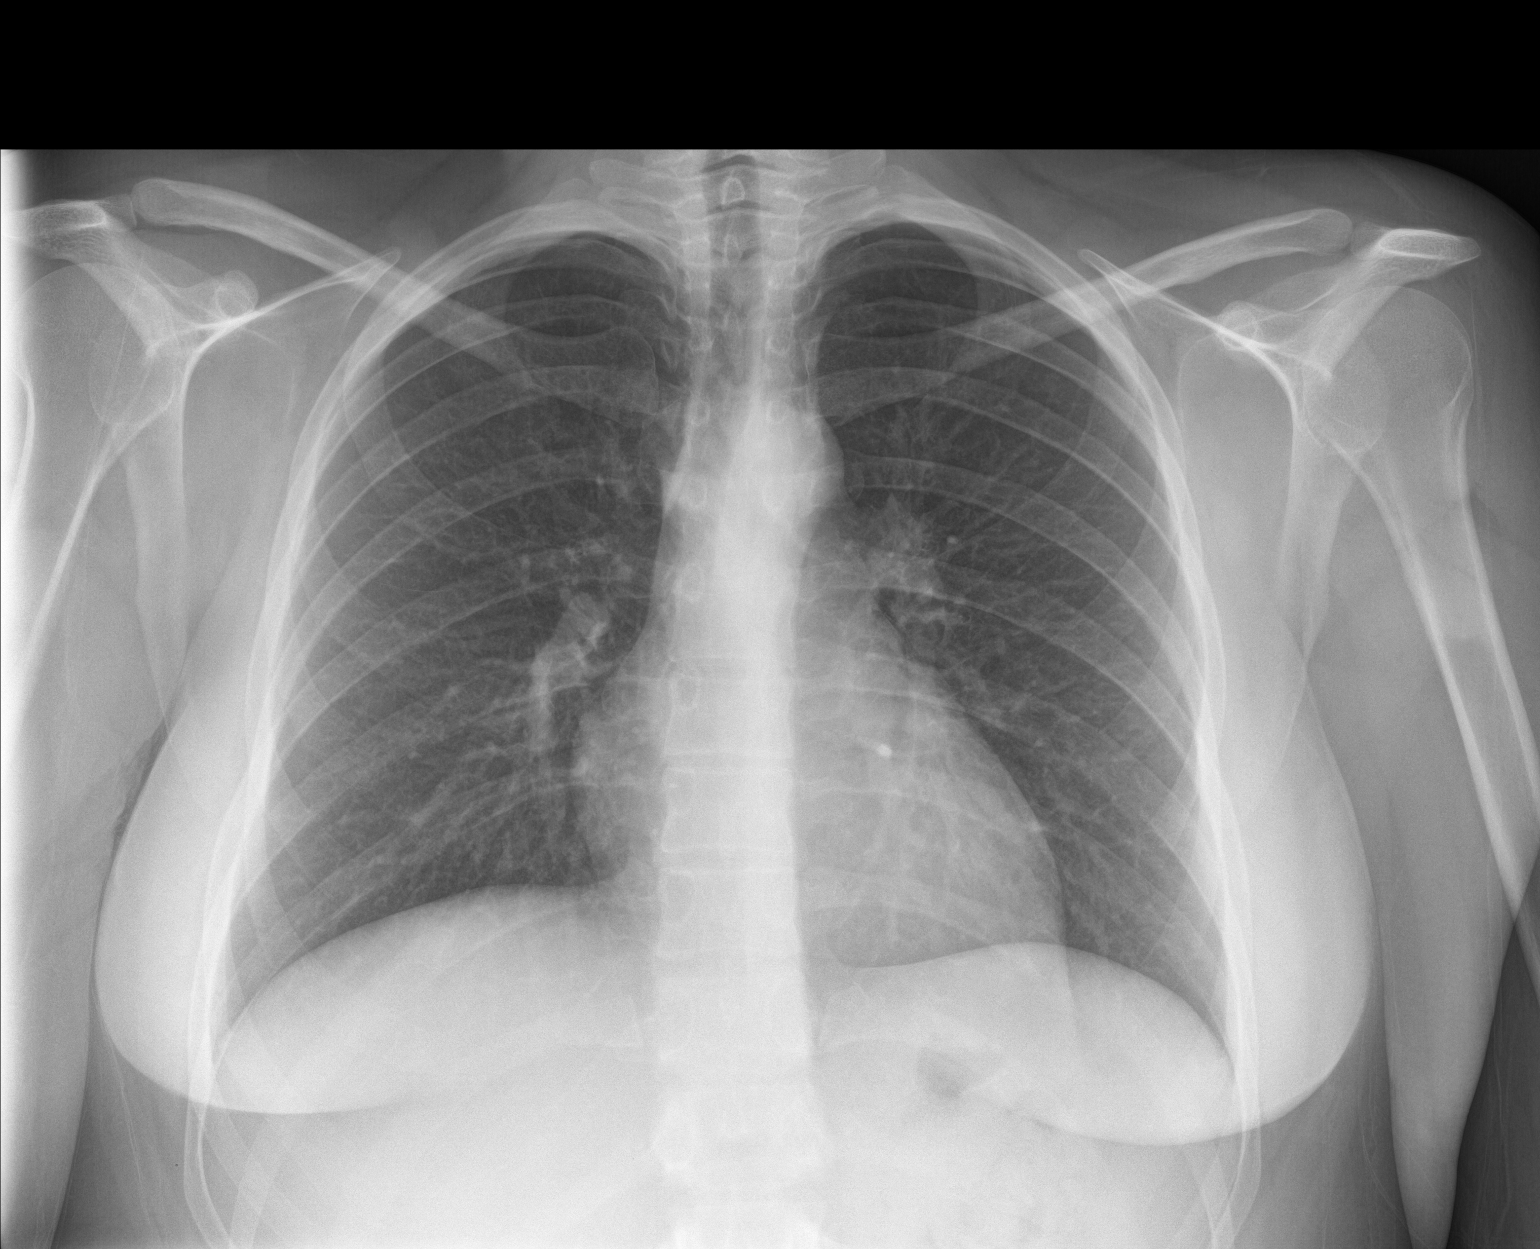

[chest lat]
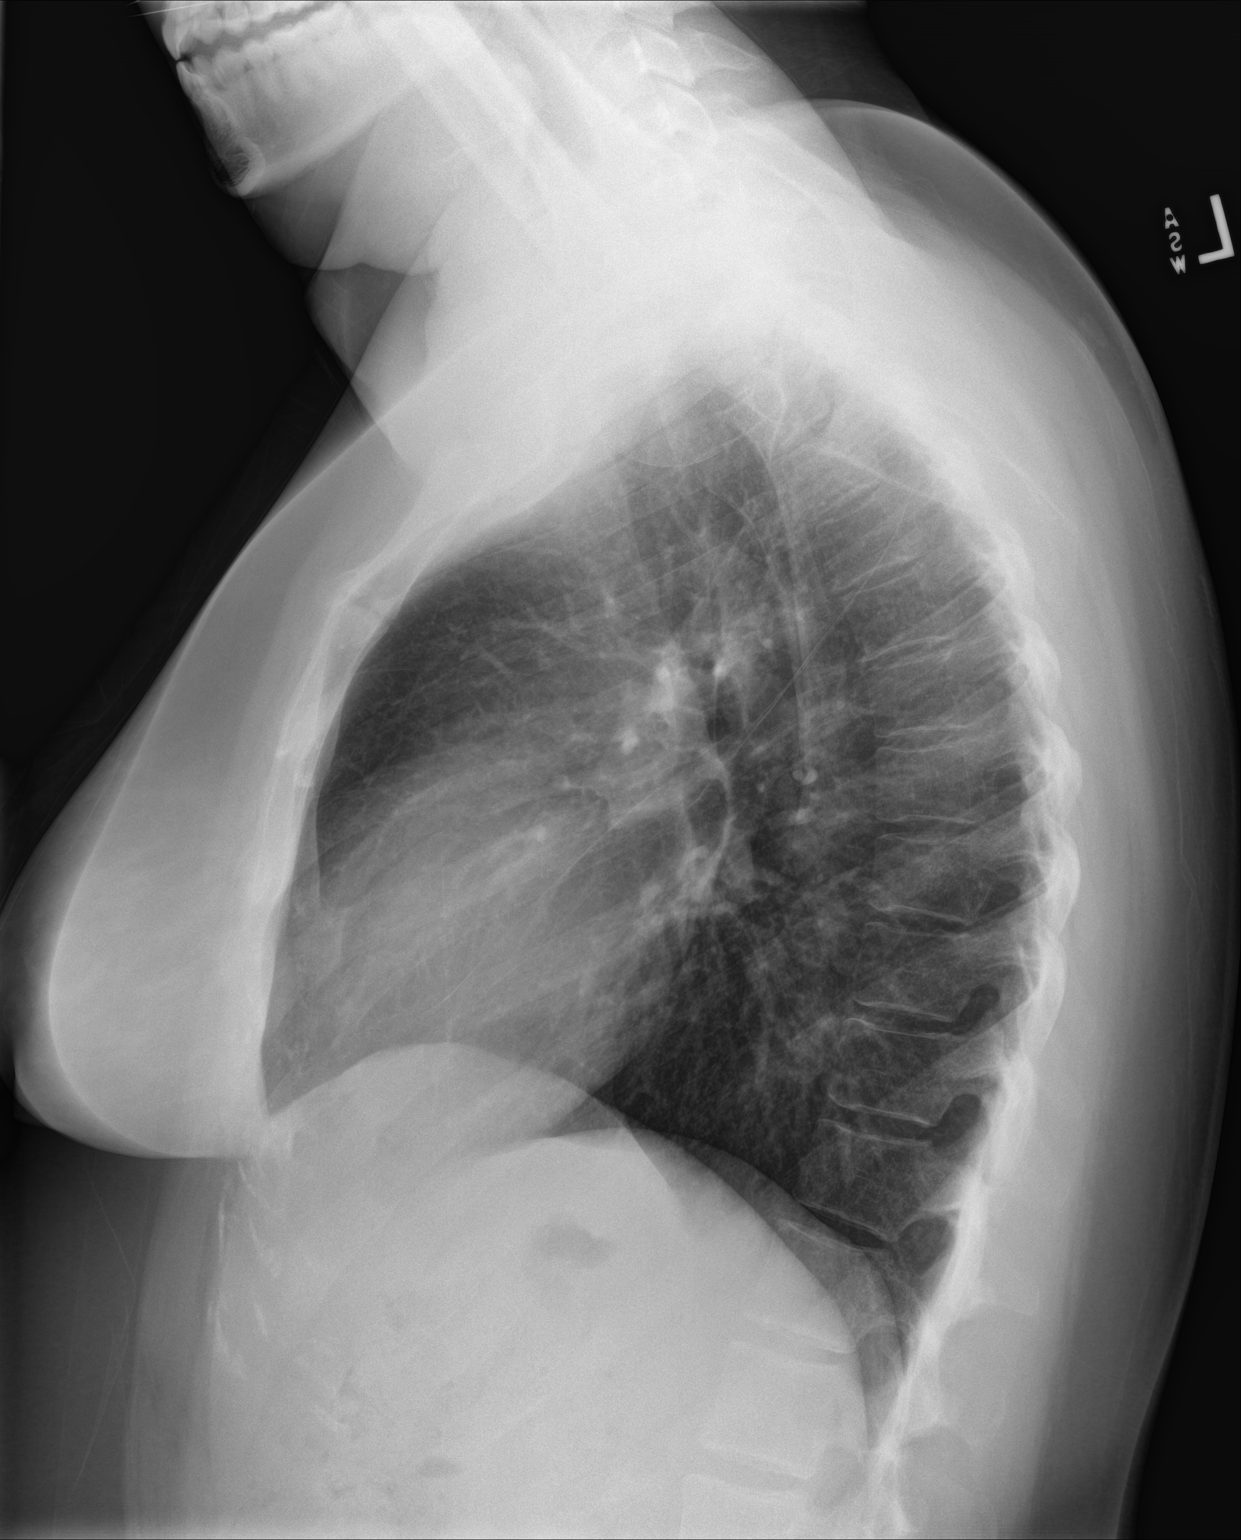

[2 of 2 positions shown; findings below may reference images not displayed]

FINDINGS: Normal heart size and mediastinal contours. No acute infiltrate or
edema. No effusion or pneumothorax. No acute osseous findings.
IMPRESSION: Negative chest.

## 2020-09-14 ENCOUNTER — Encounter: Payer: Self-pay | Admitting: Emergency Medicine

## 2020-09-14 ENCOUNTER — Ambulatory Visit
Admission: EM | Admit: 2020-09-14 | Discharge: 2020-09-14 | Disposition: A | Discharge status: Home / Self Care | Payer: BC Managed Care – PPO | Attending: Internal Medicine | Admitting: Internal Medicine

## 2020-09-14 ENCOUNTER — Other Ambulatory Visit: Payer: Self-pay

## 2020-09-14 DIAGNOSIS — J029 Acute pharyngitis, unspecified: Secondary | ICD-10-CM | POA: Diagnosis not present

## 2020-09-14 DIAGNOSIS — Z20822 Contact with and (suspected) exposure to covid-19: Secondary | ICD-10-CM | POA: Insufficient documentation

## 2020-09-14 LAB — SARS CORONAVIRUS 2 (TAT 6-24 HRS): SARS Coronavirus 2: NEGATIVE

## 2020-09-14 LAB — GROUP A STREP BY PCR: Group A Strep by PCR: NOT DETECTED

## 2020-09-14 NOTE — ED Provider Notes (Signed)
[facility]    CSN: [id number] Arrival date & time: 09/14/20  0807      History   Chief Complaint Chief Complaint  Patient presents with  . Sore Throat  . Generalized Body Aches  . Nasal Congestion    HPI Rachel Ortega is a 24 y.o. female who presents with onset of ST, rhinitis x 1 d and body aches, fever, chills and decreased appetite x 4 days. Her temp has been only 99. Has had diarrhea and nausea as well x 4 days. Had 3-4 BM's per day each day.  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 Depression   . Medical history non-contributory     Patient Active Problem List   Diagnosis Date Noted  . Acute blood loss anemia 02/07/2018  . Thrombocytopenia due to blood loss 02/07/2018  . Elevated blood pressure complicating pregnancy, antepartum, third trimester 02/05/2018  . Preeclampsia, third trimester 02/05/2018  . Pregnancy 10/24/2017  . family H/O inherited metabolic disease 96/78/9381  . First trimester screening   . Supervision of normal first pregnancy, antepartum 07/10/2017    Past Surgical History:  Procedure Laterality Date  . TONSILLECTOMY Bilateral   . TONSILLECTOMY    . WISDOM TOOTH EXTRACTION Bilateral     OB History    Gravida  1   Para      Term      Preterm      AB      Living  0     SAB      TAB      Ectopic      Multiple      Live Births               Home Medications    Prior to Admission medications   Medication Sig Start Date End Date Taking? Authorizing Provider  sertraline (ZOLOFT) 50 MG tablet Take 50 mg by mouth daily. 01/22/19  Yes [provider]    Family History Family History  Problem Relation Age of Onset  . Lupus Mother   . Rheum arthritis Mother   . Healthy Father     Social History Social History     Tobacco Use  . Smoking status: Never Smoker  . Smokeless tobacco: Never Used  Vaping Use  . Vaping Use: Never used  Substance Use Topics  . Alcohol use: Yes    Comment: social  . Drug use: No     Allergies   Sulfa antibiotics   Review of Systems Review of Systems  Constitutional: Positive for appetite change, chills, diaphoresis, fatigue and fever.  HENT: Positive for congestion, postnasal drip, rhinorrhea and sore throat. Negative for ear discharge, ear pain and trouble swallowing.        Her ears have been itching x 1 month  Eyes: Negative for discharge.  Respiratory: Negative for cough, shortness of breath and wheezing.   Cardiovascular: Negative for chest pain.  Gastrointestinal: Positive for diarrhea, nausea and vomiting. Negative for abdominal pain.  Musculoskeletal: Positive for myalgias.  Skin: Negative for rash.  Neurological: Negative for dizziness and headaches.       Has chronic HA's   Physical Exam Triage Vital Signs ED Triage Vitals  Enc Vitals Group     BP 09/14/20 0824 120/69  Pulse Rate 09/14/20 0824 89     Resp 09/14/20 0824 18     Temp 09/14/20 0824 98.4 F (36.9 C)     Temp Source 09/14/20 0824 Oral     SpO2 09/14/20 0824 99 %     Weight 09/14/20 0825 170 lb (77.1 kg)     Height 09/14/20 0825 5\' 6"  (1.676 m)     Head Circumference --      Peak Flow --      Pain Score 09/14/20 0825 8     Pain Loc --      Pain Edu? --      Excl. in GC? --    No data found.  Updated Vital Signs BP 120/69 (BP Location: Left Arm)   Pulse 89   Temp 98.4 F (36.9 C) (Oral)   Resp 18   Ht 5\' 6"  (1.676 m)   Wt 170 lb (77.1 kg)   LMP 08/26/2020 (Exact Date)   SpO2 99%   BMI 27.44 kg/m   Visual Acuity Right Eye Distance:   Left Eye Distance:   Bilateral Distance:    Right Eye Near:   Left Eye Near:    Bilateral Near:     Physical Exam Nursing note reviewed.  Constitutional:      Appearance: She is obese.     Comments: In pain from getting  Covid test and her R nostril is bleeding( she has had 10+ of these type test and never been so painful or bled)  HENT:     Head: Normocephalic.     Right Ear: Ear canal normal. No drainage.     Left Ear: Ear canal normal. No drainage.     Ears:     Comments: Both TM's gray and dull    Nose: Rhinorrhea present.     Comments: No blood noted in the R nostril post bleeding, so seems the bleeding was from deeper area which I cant see since her mucosa is moderately swollen. L side is normal. Has clear mucous    Mouth/Throat:     Mouth: Mucous membranes are moist. No oral lesions.     Pharynx: Oropharynx is clear. Uvula midline.  Eyes:     Conjunctiva/sclera: Conjunctivae normal.  Cardiovascular:     Heart sounds: Normal heart sounds. No murmur heard.   Pulmonary:     Effort: Pulmonary effort is normal.     Breath sounds: Normal breath sounds.  Abdominal:     General: Bowel sounds are normal.     Palpations: Abdomen is soft. There is no mass.     Tenderness: There is no abdominal tenderness. There is no guarding or rebound.  Musculoskeletal:     Cervical back: Neck supple.  Lymphadenopathy:     Cervical: No cervical adenopathy.  Skin:    General: Skin is warm and dry.  Neurological:     Mental Status: She is alert and oriented to person, place, and time.  Psychiatric:        Mood and Affect: Mood normal.        Behavior: Behavior normal.    UC Treatments / Results  Labs (all labs ordered are listed, but only abnormal results are displayed) Labs Reviewed  GROUP A STREP BY PCR  SARS CORONAVIRUS 2 (TAT 6-24 HRS)  Rapid PCR test is neg  EKG   Radiology No results found.  Procedures Procedures (including critical care time)  Medications Ordered in UC Medications - No data to display  Initial  Impression / Assessment and Plan / UC Course  I have reviewed the triage vital signs and the nursing notes. Pertinent labs results that were available during my care of the patient  were reviewed by me and considered in my medical decision making (see chart for details). Covid test is pending. Supportive care advised. See instructions.   Final Clinical Impressions(s) / UC Diagnoses   Final diagnoses:  Pharyngitis, unspecified etiology  Suspected COVID-19 virus infection     Discharge Instructions     Stay quarantined until you get the Covid test.  Stay on BRAT test while having the diarrhea  If your Covid test ends up positive you may take the following supplements to help your immune system be stronger to fight this viral infection Take Quarcetin 500 mg three times a day x 7 days with Zinc 50 mg ones a day x 7 days. The quarcetin is an antiviral and anti-inflammatory supplement which helps open the zinc channels in the cell to absorb Zinc. Zinc helps decrease the virus load in your body. Take Melatonin 6-10 mg at bed time which also helps support your immune system.  Also make sure to take Vit D 5,000 IU per day with a fatty meal and Vit C 1000 mg a day until you are completely better. Stay on Vitamin D 2,000  and C the rest of the season.  Don't lay around, keep active and walk as much as you are able to to prevent worsening of your symptoms.  Follow up with your family Dr next week.  If you get short of breath and you are able to check  your oxygen with a pulse oxygen meter, if it gets to 92% or less, you need to go to the hospital to be admitted. If you dont have one, come back here and we will assess you.      ED Prescriptions    None     PDMP not reviewed this encounter.   Garey Ham, [place] 09/14/20 [phone]

## 2020-09-14 NOTE — Discharge Instructions (Addendum)
Stay quarantined until you get the Covid test.  Stay on BRAT test while having the diarrhea  If your Covid test ends up positive you may take the following supplements to help your immune system be stronger to fight this viral infection Take Quarcetin 500 mg three times a day x 7 days with Zinc 50 mg ones a day x 7 days. The quarcetin is an antiviral and anti-inflammatory supplement which helps open the zinc channels in the cell to absorb Zinc. Zinc helps decrease the virus load in your body. Take Melatonin 6-10 mg at bed time which also helps support your immune system.  Also make sure to take Vit D 5,000 IU per day with a fatty meal and Vit C 1000 mg a day until you are completely better. Stay on Vitamin D 2,000  and C the rest of the season.  Don't lay around, keep active and walk as much as you are able to to prevent worsening of your symptoms.  Follow up with your family Dr next week.  If you get short of breath and you are able to check  your oxygen with a pulse oxygen meter, if it gets to 92% or less, you need to go to the hospital to be admitted. If you dont have one, come back here and we will assess you.

## 2020-09-14 NOTE — ED Triage Notes (Signed)
Patient in today c/o sore throat, runny nose x 1 day, body aches, decreased appetite x 4 days. Patient received her 2nd covid vaccine on 09/08/20. Patient has had chills and sweats, but temp 99.

## 2020-10-08 ENCOUNTER — Encounter: Payer: Self-pay | Admitting: Emergency Medicine

## 2020-10-08 ENCOUNTER — Other Ambulatory Visit: Payer: Self-pay

## 2020-10-08 ENCOUNTER — Ambulatory Visit
Admission: EM | Admit: 2020-10-08 | Discharge: 2020-10-08 | Disposition: A | Discharge status: Home / Self Care | Payer: BC Managed Care – PPO | Attending: Physician Assistant | Admitting: Physician Assistant

## 2020-10-08 DIAGNOSIS — H9203 Otalgia, bilateral: Secondary | ICD-10-CM

## 2020-10-08 DIAGNOSIS — H60393 Other infective otitis externa, bilateral: Secondary | ICD-10-CM | POA: Diagnosis not present

## 2020-10-08 MED ORDER — OFLOXACIN 0.3 % OT SOLN: 10.0000 [drp] | Freq: Every day | OTIC | 1 refills | Status: AC

## 2020-10-08 NOTE — Discharge Instructions (Signed)
OTITIS EXTERNA: On exam, you have Swimmer's ear. Please see attached discharge notes and instructions including how to use ear drops. Avoid swimming until symptoms resolve. May take Tylenol/Motrin for ear pain, but ear drops should begin to improve pain in 1-3 days. Return or visit PCP if symptoms worsen or do not improve with prescribed ear drops.  

## 2020-10-08 NOTE — ED Provider Notes (Signed)
[facility]    CSN: [id number] Arrival date & time: 10/08/20  1032      History   Chief Complaint Chief Complaint  Patient presents with  . Otalgia    bilateral    HPI Rachel Ortega is a 24 y.o. female presented for 1 week history of bilateral ear pain with drainage from the ears.  She says that there is crusting inside the right ear.  Admits to tenderness when she palpates her ear-tragus.  Denies any associated fever, fatigue, cough, sore throat, congestion.  Patient denies any recent swimming in any body water.  Has not taken any over-the-counter medications or use drops.  No other complaints or concerns.  HPI  Past Medical History:  Diagnosis Date  . Depression   . Medical history non-contributory     Patient Active Problem List   Diagnosis Date Noted  . Acute blood loss anemia 02/07/2018  . Thrombocytopenia due to blood loss 02/07/2018  . Elevated blood pressure complicating pregnancy, antepartum, third trimester 02/05/2018  . Preeclampsia, third trimester 02/05/2018  . Pregnancy 10/24/2017  . family H/O inherited metabolic disease 22/29/7989  . First trimester screening   . Supervision of normal first pregnancy, antepartum 07/10/2017    Past Surgical History:  Procedure Laterality Date  . TONSILLECTOMY Bilateral   . TONSILLECTOMY    . WISDOM TOOTH EXTRACTION Bilateral     OB History    Gravida  1   Para      Term      Preterm      AB      Living  0     SAB      TAB      Ectopic      Multiple      Live Births               Home Medications    Prior to Admission medications   Medication Sig Start Date End Date Taking? Authorizing Provider  sertraline (ZOLOFT) 50 MG tablet Take 50 mg by mouth daily. 01/22/19  Yes [provider]  ofloxacin (FLOXIN) 0.3 % OTIC solution Place 10 drops into both ears daily for 7 days. 10/08/20 10/15/20  Shirlee Latch, PA-C    Family History Family History  Problem Relation  Age of Onset  . Lupus Mother   . Rheum arthritis Mother   . Healthy Father     Social History Social History   Tobacco Use  . Smoking status: Never Smoker  . Smokeless tobacco: Never Used  Vaping Use  . Vaping Use: Never used  Substance Use Topics  . Alcohol use: Yes    Comment: social  . Drug use: No     Allergies   Sulfa antibiotics   Review of Systems Review of Systems  Constitutional: Negative for fatigue and fever.  HENT: Positive for ear discharge and ear pain. Negative for congestion, hearing loss, rhinorrhea, sinus pressure, sinus pain and sore throat.   Respiratory: Negative for cough.   Gastrointestinal: Negative for nausea and vomiting.  Neurological: Negative for weakness and headaches.     Physical Exam Triage Vital Signs ED Triage Vitals  Enc Vitals Group     BP 10/08/20 1109 113/78     Pulse Rate 10/08/20 1109 73     Resp 10/08/20 1109 14     Temp 10/08/20 1109 98.2 F (36.8 C)     Temp Source 10/08/20 1109 Oral     SpO2 10/08/20 1109 99 %  Weight 10/08/20 1106 185 lb (83.9 kg)     Height 10/08/20 1106 5\' 6"  (1.676 m)     Head Circumference --      Peak Flow --      Pain Score 10/08/20 1105 7     Pain Loc --      Pain Edu? --      Excl. in GC? --    No data found.  Updated Vital Signs BP 113/78 (BP Location: Right Arm)   Pulse 73   Temp 98.2 F (36.8 C) (Oral)   Resp 14   Ht 5\' 6"  (1.676 m)   Wt 185 lb (83.9 kg)   LMP 09/25/2020   SpO2 99%   BMI 29.86 kg/m    Physical Exam Vitals and nursing note reviewed.  Constitutional:      General: She is not in acute distress.    Appearance: Normal appearance. She is not ill-appearing or toxic-appearing.  HENT:     Head: Normocephalic and atraumatic.     Right Ear: Hearing and tympanic membrane normal. Drainage (yellowish purulent debris ) and tenderness (tragus) present.     Left Ear: Hearing and tympanic membrane normal. Drainage (yellowish purulent debris ) and tenderness  (tragus) present.     Nose: Nose normal.     Mouth/Throat:     Mouth: Mucous membranes are moist.     Pharynx: Oropharynx is clear.  Eyes:     General: No scleral icterus.       Right eye: No discharge.        Left eye: No discharge.     Conjunctiva/sclera: Conjunctivae normal.  Cardiovascular:     Rate and Rhythm: Normal rate and regular rhythm.  Pulmonary:     Effort: Pulmonary effort is normal. No respiratory distress.  Musculoskeletal:     Cervical back: Neck supple.  Skin:    General: Skin is dry.  Neurological:     General: No focal deficit present.     Mental Status: She is alert. Mental status is at baseline.     Motor: No weakness.     Gait: Gait normal.  Psychiatric:        Mood and Affect: Mood normal.        Behavior: Behavior normal.        Thought Content: Thought content normal.      UC Treatments / Results  Labs (all labs ordered are listed, but only abnormal results are displayed) Labs Reviewed - No data to display  EKG   Radiology No results found.  Procedures Procedures (including critical care time)  Medications Ordered in UC Medications - No data to display  Initial Impression / Assessment and Plan / UC Course  I have reviewed the triage vital signs and the nursing notes.  Pertinent labs & imaging results that were available during my care of the patient were reviewed by me and considered in my medical decision making (see chart for details).    Exam consistent with bilateral otitis externa without narrowing of your canal or significant swelling.  Treating at this time with ofloxacin eardrops.  Advised over-the-counter ibuprofen and Motrin for pain relief for the next couple days.  Advise follow-up as needed for any new worsening symptoms or if not better over the next week.   Final Clinical Impressions(s) / UC Diagnoses   Final diagnoses:  Other infective acute otitis externa of both ears  Otalgia of both ears     Discharge  Instructions  OTITIS EXTERNA: On exam, you have Swimmer's ear. Please see attached discharge notes and instructions including how to use ear drops. Avoid swimming until symptoms resolve. May take Tylenol/Motrin for ear pain, but ear drops should begin to improve pain in 1-3 days. Return or visit PCP if symptoms worsen or do not improve with prescribed ear drops.     ED Prescriptions    Medication Sig Dispense Auth. Provider   ofloxacin (FLOXIN) 0.3 % OTIC solution Place 10 drops into both ears daily for 7 days. 5 mL Shirlee Latch, PA-C     PDMP not reviewed this encounter.   Shirlee Latch, PA-C 10/08/20 1144

## 2020-10-08 NOTE — ED Triage Notes (Signed)
Patient c/o bilateral ear pain for the past week.  Patient reports drainage from both ears.  Patient denies fevers.

## 2020-12-01 ENCOUNTER — Ambulatory Visit: Payer: Self-pay

## 2020-12-02 ENCOUNTER — Encounter: Payer: Self-pay | Admitting: Emergency Medicine

## 2020-12-02 ENCOUNTER — Other Ambulatory Visit: Payer: Self-pay

## 2020-12-02 ENCOUNTER — Ambulatory Visit
Admission: EM | Admit: 2020-12-02 | Discharge: 2020-12-02 | Disposition: A | Discharge status: Home / Self Care | Payer: BC Managed Care – PPO | Attending: Physician Assistant | Admitting: Physician Assistant

## 2020-12-02 DIAGNOSIS — H60393 Other infective otitis externa, bilateral: Secondary | ICD-10-CM | POA: Diagnosis present

## 2020-12-02 DIAGNOSIS — R300 Dysuria: Secondary | ICD-10-CM | POA: Diagnosis present

## 2020-12-02 DIAGNOSIS — N390 Urinary tract infection, site not specified: Secondary | ICD-10-CM | POA: Diagnosis present

## 2020-12-02 LAB — WET PREP, GENITAL
Clue Cells Wet Prep HPF POC: NONE SEEN
Sperm: NONE SEEN
Trich, Wet Prep: NONE SEEN
WBC, Wet Prep HPF POC: NONE SEEN
Yeast Wet Prep HPF POC: NONE SEEN

## 2020-12-02 LAB — URINALYSIS, COMPLETE (UACMP) WITH MICROSCOPIC
Bilirubin Urine: NEGATIVE
Glucose, UA: NEGATIVE mg/dL
Ketones, ur: NEGATIVE mg/dL
Nitrite: POSITIVE — AB
Protein, ur: NEGATIVE mg/dL
Specific Gravity, Urine: <1.005 — ABNORMAL LOW (ref 1.005–1.030)
Squamous Epithelial / HPF: NONE SEEN (ref 0–5)
pH: 6 (ref 5.0–8.0)

## 2020-12-02 MED ORDER — FLUCONAZOLE 150 MG PO TABS: ORAL_TABLET | ORAL | 0 refills | Status: DC

## 2020-12-02 MED ORDER — NITROFURANTOIN MONOHYD MACRO 100 MG PO CAPS: 100.0000 mg | ORAL_CAPSULE | Freq: Two times a day (BID) | ORAL | 0 refills | Status: AC

## 2020-12-02 MED ORDER — OFLOXACIN 0.3 % OT SOLN: 10.0000 [drp] | Freq: Every day | OTIC | 0 refills | Status: AC

## 2020-12-02 NOTE — Discharge Instructions (Addendum)

## 2020-12-02 NOTE — ED Provider Notes (Addendum)
[facility]    CSN: [id number] Arrival date & time: 12/02/20  1031      History   Chief Complaint Chief Complaint  Patient presents with  . Dysuria  . Otalgia  . Exposure to STD    HPI Rachel Ortega is a 24 y.o. female presenting for onset of dysuria, urinary frequency, and suprapubic pressure yesterday.  She denies any vaginal discharge.  Patient states that she would like STI screening as she recently had a new partner and they did not use protection.  Her last menstrual period ended about a week ago.  Denies concern for pregnancy.  Denies any abdominal or back pain.  No hematuria.  Patient also states that she has been having bilateral ear pain, drainage and crusting for the past week.  Patient states she had similar symptoms 2 months ago and that improved with ofloxacin eardrops as prescribed to her.  She denies any cough, congestion, sore throat or other upper respiratory symptoms.  No known COVID-19 exposure.  Patient states that she believes that her ears are bothering her due to use of her air pods.  No other complaints or concerns.  HPI  Past Medical History:  Diagnosis Date  . Depression   . Medical history non-contributory     Patient Active Problem List   Diagnosis Date Noted  . Acute blood loss anemia 02/07/2018  . Thrombocytopenia due to blood loss 02/07/2018  . Elevated blood pressure complicating pregnancy, antepartum, third trimester 02/05/2018  . Preeclampsia, third trimester 02/05/2018  . Pregnancy 10/24/2017  . family H/O inherited metabolic disease 69/62/9528  . First trimester screening   . Supervision of normal first pregnancy, antepartum 07/10/2017    Past Surgical History:  Procedure Laterality Date  . TONSILLECTOMY Bilateral   . TONSILLECTOMY    . WISDOM TOOTH EXTRACTION Bilateral     OB History    Gravida  1   Para      Term      Preterm      AB      Living  0     SAB      IAB      Ectopic      Multiple       Live Births               Home Medications    Prior to Admission medications   Medication Sig Start Date End Date Taking? Authorizing Provider  sertraline (ZOLOFT) 50 MG tablet Take 50 mg by mouth daily. 01/22/19  Yes [provider]  nitrofurantoin, macrocrystal-monohydrate, (MACROBID) 100 MG capsule Take 1 capsule (100 mg total) by mouth 2 (two) times daily for 5 days. 12/02/20 12/07/20  Eusebio Friendly B, PA-C  ofloxacin (FLOXIN) 0.3 % OTIC solution Place 10 drops into both ears daily for 7 days. 12/02/20 12/09/20  Shirlee Latch, PA-C    Family History Family History  Problem Relation Age of Onset  . Lupus Mother   . Rheum arthritis Mother   . Healthy Father     Social History Social History   Tobacco Use  . Smoking status: Never Smoker  . Smokeless tobacco: Never Used  Vaping Use  . Vaping Use: Never used  Substance Use Topics  . Alcohol use: Yes    Comment: social  . Drug use: No     Allergies   Sulfa antibiotics   Review of Systems Review of Systems  Constitutional: Negative for chills and fever.  HENT: Positive  for ear pain. Negative for congestion and sore throat.   Respiratory: Negative for cough.   Gastrointestinal: Negative for abdominal pain, diarrhea, nausea and vomiting.  Genitourinary: Positive for dysuria, frequency and urgency. Negative for decreased urine volume, flank pain, hematuria, pelvic pain, vaginal bleeding, vaginal discharge and vaginal pain.  Musculoskeletal: Negative for back pain.  Skin: Negative for rash.     Physical Exam Triage Vital Signs ED Triage Vitals  Enc Vitals Group     BP 12/02/20 1053 125/72     Pulse Rate 12/02/20 1053 70     Resp 12/02/20 1053 18     Temp 12/02/20 1053 98.3 F (36.8 C)     Temp Source 12/02/20 1053 Oral     SpO2 12/02/20 1053 100 %     Weight 12/02/20 1053 184 lb 15.5 oz (83.9 kg)     Height 12/02/20 1053 5\' 6"  (1.676 m)     Head Circumference --      Peak Flow --      Pain  Score 12/02/20 1052 8     Pain Loc --      Pain Edu? --      Excl. in GC? --    No data found.  Updated Vital Signs BP 125/72 (BP Location: Right Arm)   Pulse 70   Temp 98.3 F (36.8 C) (Oral)   Resp 18   Ht 5\' 6"  (1.676 m)   Wt 184 lb 15.5 oz (83.9 kg)   LMP 11/20/2020   SpO2 100%   BMI 29.85 kg/m      Physical Exam Vitals and nursing note reviewed.  Constitutional:      General: She is not in acute distress.    Appearance: Normal appearance. She is not ill-appearing or toxic-appearing.  HENT:     Head: Normocephalic and atraumatic.     Right Ear: Hearing, tympanic membrane and external ear normal. Drainage (light yellow debris), swelling and tenderness present.     Left Ear: Hearing, tympanic membrane and external ear normal. Drainage (light yellow debris), swelling and tenderness present.     Nose: Nose normal.     Mouth/Throat:     Mouth: Mucous membranes are moist.     Pharynx: Oropharynx is clear.  Eyes:     General: No scleral icterus.       Right eye: No discharge.        Left eye: No discharge.     Conjunctiva/sclera: Conjunctivae normal.  Cardiovascular:     Rate and Rhythm: Normal rate and regular rhythm.     Heart sounds: Normal heart sounds.  Pulmonary:     Effort: Pulmonary effort is normal. No respiratory distress.     Breath sounds: Normal breath sounds.  Abdominal:     Palpations: Abdomen is soft.     Tenderness: There is abdominal tenderness (mild suprapubic). There is no right CVA tenderness or left CVA tenderness.  Musculoskeletal:     Cervical back: Neck supple.  Skin:    General: Skin is dry.  Neurological:     General: No focal deficit present.     Mental Status: She is alert. Mental status is at baseline.     Motor: No weakness.     Gait: Gait normal.  Psychiatric:        Mood and Affect: Mood normal.        Behavior: Behavior normal.        Thought Content: Thought content normal.  UC Treatments / Results  Labs (all labs  ordered are listed, but only abnormal results are displayed) Labs Reviewed  URINALYSIS, COMPLETE (UACMP) WITH MICROSCOPIC - Abnormal; Notable for the following components:      Result Value   Specific Gravity, Urine <1.005 (*)    Hgb urine dipstick TRACE (*)    Nitrite POSITIVE (*)    Leukocytes,Ua TRACE (*)    Bacteria, UA FEW (*)    All other components within normal limits  WET PREP, GENITAL  CHLAMYDIA/NGC RT PCR ([facility] ONLY)  URINE CULTURE    EKG   Radiology No results found.  Procedures Procedures (including critical care time)  Medications Ordered in UC Medications - No data to display  Initial Impression / Assessment and Plan / UC Course  I have reviewed the triage vital signs and the nursing notes.  Pertinent labs & imaging results that were available during my care of the patient were reviewed by me and considered in my medical decision making (see chart for details).   Urinalysis today shows trace blood, trace leukocytes, and positive nitrites.  Urine sent for culture.  Treating UTI with Macrobid.  Wet prep is normal.  GC and Chlamydia testing sent.  Advised will call with testing is positive, otherwise she can find results on MyChart.  Encouraged safe sex.  Patient states she generally gets yeast infections with antibiotic use, so I sent Diflucan to pharmacy.  I saw patient 2 months ago for similar ear complaint and exam is consistent with 2 months ago.  Patient states she had relief with ofloxacin eardrops 5 represcribed them again today.  Advised her to make sure she cleans her ear pods and try not to scratch the ears.  Advised to follow-up as needed.   Final Clinical Impressions(s) / UC Diagnoses   Final diagnoses:  Lower urinary tract infectious disease  Dysuria  Other infective acute otitis externa of both ears     Discharge Instructions     UTI: Based on either symptoms or urinalysis, you may have a urinary tract infection. We will send the  urine for culture and call with results in a few days. Begin antibiotics at this time. Your symptoms should be much improved over the next 2-3 days. Increase rest and fluid intake. If for some reason symptoms are worsening or not improving after a couple of days or the urine culture determines the antibiotics you are taking will not treat the infection, the antibiotics may be changed. Return or go to ER for fever, back pain, worsening urinary pain, discharge, increased blood in urine. May take Tylenol or Motrin OTC for pain relief or consider AZO if no contraindications     ED Prescriptions    Medication Sig Dispense Auth. Provider   ofloxacin (FLOXIN) 0.3 % OTIC solution Place 10 drops into both ears daily for 7 days. 5 mL Eusebio Friendly B, PA-C   nitrofurantoin, macrocrystal-monohydrate, (MACROBID) 100 MG capsule Take 1 capsule (100 mg total) by mouth 2 (two) times daily for 5 days. 10 capsule Shirlee Latch, PA-C     PDMP not reviewed this encounter.   Shirlee Latch, PA-C 12/02/20 1144    Eusebio Friendly B, PA-C 12/02/20 1147

## 2020-12-02 NOTE — ED Triage Notes (Signed)
Pt c/o dysuria, foul smelling urine and frequency. Started yesterday. She also having right ear pain. Started about a week ago. Denies vaginal discharge. She is also requesting STD screening.

## 2020-12-03 LAB — CHLAMYDIA/NGC RT PCR (ARMC ONLY)
Chlamydia Tr: NOT DETECTED
N gonorrhoeae: NOT DETECTED

## 2020-12-05 LAB — URINE CULTURE: Culture: >=100000 — AB

## 2020-12-24 ENCOUNTER — Other Ambulatory Visit: Payer: Self-pay

## 2020-12-24 ENCOUNTER — Ambulatory Visit
Admission: EM | Admit: 2020-12-24 | Discharge: 2020-12-24 | Disposition: A | Discharge status: Home / Self Care | Payer: Medicaid Other | Attending: Family Medicine | Admitting: Family Medicine

## 2020-12-24 ENCOUNTER — Ambulatory Visit (INDEPENDENT_AMBULATORY_CARE_PROVIDER_SITE_OTHER): Payer: Medicaid Other

## 2020-12-24 ENCOUNTER — Encounter: Payer: Self-pay | Admitting: Emergency Medicine

## 2020-12-24 DIAGNOSIS — S9031XA Contusion of right foot, initial encounter: Secondary | ICD-10-CM | POA: Diagnosis not present

## 2020-12-24 DIAGNOSIS — M79673 Pain in unspecified foot: Secondary | ICD-10-CM

## 2020-12-24 DIAGNOSIS — R2241 Localized swelling, mass and lump, right lower limb: Secondary | ICD-10-CM

## 2020-12-24 DIAGNOSIS — S99922A Unspecified injury of left foot, initial encounter: Secondary | ICD-10-CM | POA: Diagnosis not present

## 2020-12-24 MED ORDER — MELOXICAM 15 MG PO TABS: 15.0000 mg | ORAL_TABLET | Freq: Every day | ORAL | 0 refills | Status: DC | PRN

## 2020-12-24 NOTE — Discharge Instructions (Addendum)
Rest, ice, elevation.  Medication as prescribed.  Take care  Dr. Donnamaria Shands  

## 2020-12-24 NOTE — ED Provider Notes (Signed)
[facility]    CSN: [id number] Arrival date & time: 12/24/20  0813      History   Chief Complaint Chief Complaint  Patient presents with  . Foot Pain   HPI  25 year old female presents with a foot injury.  Yesterday she hit the dorsum of her right foot on the running board of a truck.  She complains of pain of the dorsum of the right foot and difficulty ambulating due to this.  Pain 9/10 in severity.  No relieving factors.  She reports bruising and swelling.  Exacerbated by activity.  No other associated symptoms.  No other complaints  Past Medical History:  Diagnosis Date  . Depression   . Medical history non-contributory     Patient Active Problem List   Diagnosis Date Noted  . Acute blood loss anemia 02/07/2018  . Thrombocytopenia due to blood loss 02/07/2018  . Elevated blood pressure complicating pregnancy, antepartum, third trimester 02/05/2018  . Preeclampsia, third trimester 02/05/2018  . Pregnancy 10/24/2017  . family H/O inherited metabolic disease 40/34/7425  . First trimester screening   . Supervision of normal first pregnancy, antepartum 07/10/2017    Past Surgical History:  Procedure Laterality Date  . TONSILLECTOMY Bilateral   . TONSILLECTOMY    . WISDOM TOOTH EXTRACTION Bilateral     OB History    Gravida  1   Para      Term      Preterm      AB      Living  0     SAB      IAB      Ectopic      Multiple      Live Births               Home Medications    Prior to Admission medications   Medication Sig Start Date End Date Taking? Authorizing Provider  meloxicam (MOBIC) 15 MG tablet Take 1 tablet (15 mg total) by mouth daily as needed for pain. 12/24/20  Yes Amyria Komar G, DO  sertraline (ZOLOFT) 50 MG tablet Take 50 mg by mouth daily. 01/22/19   [provider]    Family History Family History  Problem Relation Age of Onset  . Lupus Mother   . Rheum arthritis Mother   . Healthy Father     Social  History Social History   Tobacco Use  . Smoking status: Never Smoker  . Smokeless tobacco: Never Used  Vaping Use  . Vaping Use: Never used  Substance Use Topics  . Alcohol use: Yes    Comment: social  . Drug use: No     Allergies   Sulfa antibiotics   Review of Systems Review of Systems  Constitutional: Negative.   Musculoskeletal:       Right foot pain, injury.   Physical Exam Triage Vital Signs ED Triage Vitals  Enc Vitals Group     BP 12/24/20 0932 117/87     Pulse Rate 12/24/20 0932 73     Resp 12/24/20 0932 18     Temp 12/24/20 0932 98.6 F (37 C)     Temp Source 12/24/20 0932 Oral     SpO2 12/24/20 0932 100 %     Weight 12/24/20 0930 180 lb (81.6 kg)     Height 12/24/20 0930 5\' 6"  (1.676 m)     Head Circumference --      Peak Flow --      Pain Score 12/24/20 0930  9     Pain Loc --      Pain Edu? --      Excl. in GC? --    Updated Vital Signs BP 117/87 (BP Location: Right Arm)   Pulse 73   Temp 98.6 F (37 C) (Oral)   Resp 18   Ht 5\' 6"  (1.676 m)   Wt 81.6 kg   LMP 12/12/2020   SpO2 100%   BMI 29.05 kg/m   Visual Acuity Right Eye Distance:   Left Eye Distance:   Bilateral Distance:    Right Eye Near:   Left Eye Near:    Bilateral Near:     Physical Exam Vitals and nursing note reviewed.  Constitutional:      General: She is not in acute distress.    Appearance: Normal appearance. She is not ill-appearing.  HENT:     Head: Normocephalic and atraumatic.  Cardiovascular:     Rate and Rhythm: Normal rate and regular rhythm.  Pulmonary:     Effort: Pulmonary effort is normal.     Breath sounds: Normal breath sounds. No wheezing, rhonchi or rales.  Musculoskeletal:     Comments: Mild erythema and tenderness palpation on the dorsum of the right foot.  Neurological:     Mental Status: She is alert.    UC Treatments / Results  Labs (all labs ordered are listed, but only abnormal results are displayed) Labs Reviewed - No data to  display  EKG   Radiology DG Foot Complete Right  Result Date: 12/24/2020 CLINICAL DATA:  25 year old female status post blunt trauma yesterday. Pain swelling and bruising at the top of the foot. EXAM: RIGHT FOOT COMPLETE - 3+ VIEW COMPARISON:  Right ankle series 05/08/2011. FINDINGS: Bone mineralization is within normal limits. To there is no evidence of fracture or dislocation. There is no evidence of arthropathy or other focal bone abnormality. Distal right foot soft tissue swelling but no discrete soft tissue injury. IMPRESSION: Soft tissue swelling. No acute fracture or dislocation identified about the right foot. Electronically Signed   By: 05/10/2011 M.D.   On: 12/24/2020 09:51    Procedures Procedures (including critical care time)  Medications Ordered in UC Medications - No data to display  Initial Impression / Assessment and Plan / UC Course  I have reviewed the triage vital signs and the nursing notes.  Pertinent labs & imaging results that were available during my care of the patient were reviewed by me and considered in my medical decision making (see chart for details).    25 year old female presents with a foot injury.  She has tenderness on the dorsum of the foot.  Mild erythema.  No significant bruising.  X-ray was obtained and was independently reviewed by me.  Interpretation: No acute fracture.  Advise rest, ice, elevation.  Meloxicam as directed.  Work note given.  Final Clinical Impressions(s) / UC Diagnoses   Final diagnoses:  Foot injury, left, initial encounter     Discharge Instructions     Rest, ice, elevation.  Medication as prescribed.  Take care  Dr. 22    ED Prescriptions    Medication Sig Dispense Auth. Provider   meloxicam (MOBIC) 15 MG tablet Take 1 tablet (15 mg total) by mouth daily as needed for pain. 30 tablet Adriana Simas, DO     PDMP not reviewed this encounter.   Tommie Sams, Tommie Sams 12/24/20 1009

## 2020-12-24 NOTE — ED Triage Notes (Signed)
Patient c/o right foot pain that started yesterday morning when she hit it on a step to get into the truck. She is c/o pain on the top of her foot.

## 2021-01-23 ENCOUNTER — Ambulatory Visit
Admission: EM | Admit: 2021-01-23 | Discharge: 2021-01-23 | Disposition: A | Discharge status: Home / Self Care | Payer: Medicaid Other | Attending: Family Medicine | Admitting: Family Medicine

## 2021-01-23 ENCOUNTER — Other Ambulatory Visit: Payer: Self-pay

## 2021-01-23 DIAGNOSIS — Z111 Encounter for screening for respiratory tuberculosis: Secondary | ICD-10-CM

## 2021-01-23 MED ORDER — TUBERCULIN PPD 5 UNIT/0.1ML ID SOLN: 5.0000 [IU] | Freq: Once | INTRADERMAL | Status: DC

## 2021-01-23 NOTE — Discharge Instructions (Signed)
Tuberculin skin test applied to left forearm. Patient will return to urgent care 02/03 after 240pm or 02/04 before 240pm.

## 2021-01-23 NOTE — ED Triage Notes (Signed)
Patient here for PPD placement for work. Placed to left forearm. Patient will return on 02/03 after 2:40pm and before 02/04 at 2:40pm. TB form placed in TB notebook.

## 2021-01-26 ENCOUNTER — Other Ambulatory Visit: Payer: Self-pay

## 2021-01-26 ENCOUNTER — Ambulatory Visit
Admission: EM | Admit: 2021-01-26 | Discharge: 2021-01-26 | Disposition: A | Discharge status: Home / Self Care | Payer: BC Managed Care – PPO

## 2021-01-26 DIAGNOSIS — Z111 Encounter for screening for respiratory tuberculosis: Secondary | ICD-10-CM

## 2021-01-26 NOTE — ED Notes (Signed)
PPD reading was neg

## 2021-01-26 NOTE — ED Triage Notes (Signed)
Pt here to have TB test read.

## 2021-02-07 ENCOUNTER — Encounter: Payer: Self-pay | Admitting: Emergency Medicine

## 2021-02-07 ENCOUNTER — Ambulatory Visit
Admission: EM | Admit: 2021-02-07 | Discharge: 2021-02-07 | Disposition: A | Discharge status: Home / Self Care | Payer: BC Managed Care – PPO | Attending: Emergency Medicine | Admitting: Emergency Medicine

## 2021-02-07 ENCOUNTER — Other Ambulatory Visit: Payer: Self-pay

## 2021-02-07 DIAGNOSIS — N3090 Cystitis, unspecified without hematuria: Secondary | ICD-10-CM | POA: Diagnosis present

## 2021-02-07 DIAGNOSIS — N898 Other specified noninflammatory disorders of vagina: Secondary | ICD-10-CM | POA: Diagnosis present

## 2021-02-07 LAB — URINALYSIS, COMPLETE (UACMP) WITH MICROSCOPIC
Bilirubin Urine: NEGATIVE
Glucose, UA: 100 mg/dL — AB
Hgb urine dipstick: NEGATIVE
Ketones, ur: NEGATIVE mg/dL
Nitrite: POSITIVE — AB
Protein, ur: NEGATIVE mg/dL
Specific Gravity, Urine: 1.015 (ref 1.005–1.030)
pH: 5.5 (ref 5.0–8.0)

## 2021-02-07 LAB — WET PREP, GENITAL
Clue Cells Wet Prep HPF POC: NONE SEEN
Sperm: NONE SEEN
Trich, Wet Prep: NONE SEEN
Yeast Wet Prep HPF POC: NONE SEEN

## 2021-02-07 LAB — GLUCOSE, CAPILLARY: Glucose-Capillary: 74 mg/dL (ref 70–99)

## 2021-02-07 LAB — CHLAMYDIA/NGC RT PCR (ARMC ONLY)
Chlamydia Tr: NOT DETECTED
N gonorrhoeae: NOT DETECTED

## 2021-02-07 LAB — PREGNANCY, URINE: Preg Test, Ur: NEGATIVE

## 2021-02-07 MED ORDER — CIPROFLOXACIN HCL 500 MG PO TABS: 500.0000 mg | ORAL_TABLET | Freq: Two times a day (BID) | ORAL | 0 refills | Status: DC

## 2021-02-07 MED ORDER — PHENAZOPYRIDINE HCL 200 MG PO TABS: 200.0000 mg | ORAL_TABLET | Freq: Three times a day (TID) | ORAL | 0 refills | Status: DC

## 2021-02-07 NOTE — Discharge Instructions (Addendum)
Your sugar test is normal, the pregnancy test is neg. The vaginal swab does not show yeast or bacterial vaginitis.  We are sending the Gonorrhea/ Chlamydia test to the lab and we will call you if positive. We are also sending the urine for a culture and if we need to change the medication we will notify you.

## 2021-02-07 NOTE — ED Provider Notes (Signed)
[facility]    CSN: [id number] Arrival date & time: 02/07/21  1036      History   Chief Complaint Chief Complaint  Patient presents with  . Dysuria  . Urinary Frequency    HPI Rachel Ortega is a 25 y.o. female who presents with suprapubic pressure, dysuria, frequency since yesterday. Normally Azo helps, but even after taking 3 of them has not helped. Denies flank pain or fever. Has had brownish discharge 2 days ago and is not due to start her period for 2 more days. She is not on birth control     Past Medical History:  Diagnosis Date  . Depression   . Medical history non-contributory     Patient Active Problem List   Diagnosis Date Noted  . Acute blood loss anemia 02/07/2018  . Thrombocytopenia due to blood loss 02/07/2018  . Elevated blood pressure complicating pregnancy, antepartum, third trimester 02/05/2018  . Preeclampsia, third trimester 02/05/2018  . Pregnancy 10/24/2017  . family H/O inherited metabolic disease 39/76/7341  . First trimester screening   . Supervision of normal first pregnancy, antepartum 07/10/2017    Past Surgical History:  Procedure Laterality Date  . TONSILLECTOMY Bilateral   . TONSILLECTOMY    . WISDOM TOOTH EXTRACTION Bilateral     OB History    Gravida  1   Para      Term      Preterm      AB      Living  0     SAB      IAB      Ectopic      Multiple      Live Births               Home Medications    Prior to Admission medications   Medication Sig Start Date End Date Taking? Authorizing Provider  sertraline (ZOLOFT) 50 MG tablet Take 50 mg by mouth daily. 01/22/19  Yes [provider]  meloxicam (MOBIC) 15 MG tablet Take 1 tablet (15 mg total) by mouth daily as needed for pain. 12/24/20   Tommie Sams, DO    Family History Family History  Problem Relation Age of Onset  . Lupus Mother   . Rheum arthritis Mother   . Healthy Father     Social History Social History   Tobacco  Use  . Smoking status: Never Smoker  . Smokeless tobacco: Never Used  Vaping Use  . Vaping Use: Never used  Substance Use Topics  . Alcohol use: Yes    Comment: social  . Drug use: No     Allergies   Sulfa antibiotics   Review of Systems Review of Systems + dysuria, frequency, abnormal vaginal discharge, abdominal pain. denies fever, chills, N/V.   Physical Exam Triage Vital Signs ED Triage Vitals  Enc Vitals Group     BP 02/07/21 1108 112/68     Pulse Rate 02/07/21 1108 66     Resp 02/07/21 1108 18     Temp 02/07/21 1108 98.6 F (37 C)     Temp Source 02/07/21 1108 Oral     SpO2 --      Weight 02/07/21 1106 179 lb 14.3 oz (81.6 kg)     Height 02/07/21 1106 5\' 6"  (1.676 m)     Head Circumference --      Peak Flow --      Pain Score 02/07/21 1106 8     Pain Loc --  Pain Edu? --      Excl. in GC? --    No data found.  Updated Vital Signs BP 112/68 (BP Location: Left Arm)   Pulse 66   Temp 98.6 F (37 C) (Oral)   Resp 18   Ht 5\' 6"  (1.676 m)   Wt 179 lb 14.3 oz (81.6 kg)   LMP 01/12/2021   BMI 29.04 kg/m   Visual Acuity Right Eye Distance:   Left Eye Distance:   Bilateral Distance:    Right Eye Near:   Left Eye Near:    Bilateral Near:     Physical Exam  Physical Exam Vitals and nursing note reviewed.  Constitutional:      General: She is not in acute distress.    Appearance: She is not toxic-appearing.  HENT:     Head: Normocephalic.     Right Ear: External ear normal.     Left Ear: External ear normal.  Eyes:     General: No scleral icterus.    Conjunctiva/sclera: Conjunctivae normal.  Pulmonary:     Effort: Pulmonary effort is normal.  Abdominal:     General: Bowel sounds are normal.     Palpations: Abdomen is soft. There is no mass.     Tenderness: There is no guarding or rebound. Has mild to moderate tenderness on suprapubic region.     Comments: - CVA tenderness   Musculoskeletal:        General: Normal range of motion.      Cervical back: Neck supple.    Skin:    General: Skin is warm and dry.     Findings: No rash.  Neurological:     Mental Status: She is alert and oriented to person, place, and time.     Gait: Gait normal.  Psychiatric:        Mood and Affect: Mood normal.        Behavior: Behavior normal.        Thought Content: Thought content normal.        Judgment: Judgment normal.    UC Treatments / Results  Labs (all labs ordered are listed, but only abnormal results are displayed) Labs Reviewed  URINALYSIS, COMPLETE (UACMP) WITH MICROSCOPIC - Abnormal; Notable for the following components:      Result Value   Color, Urine ORANGE (*)    APPearance HAZY (*)    Glucose, UA 100 (*)    Nitrite POSITIVE (*)    Leukocytes,Ua SMALL (*)    Bacteria, UA MANY (*)    All other components within normal limits    EKG   Radiology No results found.  Procedures Procedures (including critical care time)  Medications Ordered in UC Medications - No data to display  Initial Impression / Assessment and Plan / UC Course  I have reviewed the triage vital signs and the nursing notes.  Pertinent labs  results that were available during my care of the patient were reviewed by me and considered in my medical decision making (see chart for details). Has UTI Placed on Cipro and Pyridium See instructions  Final Clinical Impressions(s) / UC Diagnoses   Final diagnoses:  None   Discharge Instructions   None    ED Prescriptions    None     PDMP not reviewed this encounter.   01/14/2021, PA-C 02/07/21 1234

## 2021-02-07 NOTE — ED Triage Notes (Signed)
Patient c/o abdominal pressure, dysuria, urinary frequency that started yesterday.

## 2021-02-10 LAB — URINE CULTURE
Culture: >=100000 — AB
Special Requests: NORMAL

## 2021-09-11 ENCOUNTER — Other Ambulatory Visit: Payer: Self-pay

## 2021-09-11 ENCOUNTER — Encounter: Payer: Self-pay | Admitting: Emergency Medicine

## 2021-09-11 ENCOUNTER — Ambulatory Visit
Admission: EM | Admit: 2021-09-11 | Discharge: 2021-09-11 | Disposition: A | Discharge status: Home / Self Care | Payer: BLUE CROSS/BLUE SHIELD | Attending: Emergency Medicine | Admitting: Emergency Medicine

## 2021-09-11 DIAGNOSIS — J069 Acute upper respiratory infection, unspecified: Secondary | ICD-10-CM | POA: Diagnosis present

## 2021-09-11 DIAGNOSIS — Z20822 Contact with and (suspected) exposure to covid-19: Secondary | ICD-10-CM | POA: Diagnosis present

## 2021-09-11 LAB — SARS CORONAVIRUS 2 (TAT 6-24 HRS): SARS Coronavirus 2: NEGATIVE

## 2021-09-11 MED ORDER — HYDROCOD POLST-CPM POLST ER 10-8 MG/5ML PO SUER: 5.0000 mL | Freq: Two times a day (BID) | ORAL | 0 refills | Status: DC | PRN

## 2021-09-11 MED ORDER — FLUTICASONE PROPIONATE 50 MCG/ACT NA SUSP: 2.0000 | Freq: Every day | NASAL | 0 refills | Status: DC

## 2021-09-11 MED ORDER — BENZONATATE 200 MG PO CAPS: 200.0000 mg | ORAL_CAPSULE | Freq: Three times a day (TID) | ORAL | 0 refills | Status: DC | PRN

## 2021-09-11 MED ORDER — IBUPROFEN 600 MG PO TABS: 600.0000 mg | ORAL_TABLET | Freq: Four times a day (QID) | ORAL | 0 refills | Status: DC | PRN

## 2021-09-11 NOTE — ED Provider Notes (Signed)
HPI  SUBJECTIVE:  Rachel Ortega is a 25 y.o. female who presents with 4 days of nasal congestion, rhinorrhea, sore throat, nonproductive cough, chest soreness secondary to the cough headache and postnasal drip, body aches.  States that things "taste weird" has some shortness of breath with exertion and laryngitis.  Unable to sleep at night secondary to the cough.  States that she has felt feverish, but has not checked.  No loss of sense of smell, wheezing, nausea, vomiting, diarrhea, abdominal pain, rash.  No drooling, trismus, neck stiffness, sensation of throat swelling shut, voice changes, difficulty breathing.  No allergy or GERD symptoms.  No known COVID exposure.  Her daughter was diagnosed with RSV recently.  She got the COVID booster.  Patient had COVID in April 2019, has a BMI above 30.  LMP: Now.  Denies the possibility being pregnant.  [id number], Lorina Rabon, NP   Past Medical History:  Diagnosis Date   Depression    Medical history non-contributory     Past Surgical History:  Procedure Laterality Date   TONSILLECTOMY Bilateral    TONSILLECTOMY     WISDOM TOOTH EXTRACTION Bilateral     Family History  Problem Relation Age of Onset   Lupus Mother    Rheum arthritis Mother    Healthy Father     Social History   Tobacco Use   Smoking status: Never   Smokeless tobacco: Never  Vaping Use   Vaping Use: Never used  Substance Use Topics   Alcohol use: Yes    Comment: social   Drug use: No    No current facility-administered medications for this encounter.  Current Outpatient Medications:    benzonatate (TESSALON) 200 MG capsule, Take 1 capsule (200 mg total) by mouth 3 (three) times daily as needed for cough., Disp: 30 capsule, Rfl: 0   chlorpheniramine-HYDROcodone (TUSSIONEX PENNKINETIC ER) 10-8 MG/5ML SUER, Take 5 mLs by mouth every 12 (twelve) hours as needed for cough., Disp: 60 mL, Rfl: 0   fluticasone (FLONASE) 50 MCG/ACT nasal spray, Place 2 sprays into both  nostrils daily., Disp: 16 g, Rfl: 0   ibuprofen (ADVIL) 600 MG tablet, Take 1 tablet (600 mg total) by mouth every 6 (six) hours as needed., Disp: 30 tablet, Rfl: 0   sertraline (ZOLOFT) 50 MG tablet, Take 50 mg by mouth daily., Disp: , Rfl:   Allergies  Allergen Reactions   Sulfa Antibiotics Other (See Comments)    Headaches  Headaches       ROS  As noted in HPI.   Physical Exam  BP 121/87 (BP Location: Right Arm)   Pulse 80   Temp 98.4 F (36.9 C) (Oral)   Resp 18   LMP 09/10/2021 (Exact Date)   SpO2 99%   Constitutional: Well developed, well nourished, no acute distress Eyes:  EOMI, conjunctiva normal bilaterally HENT: Normocephalic, atraumatic,mucus membranes moist.  Clear nasal congestion.  Erythematous, but not swollen turbinates.  No maxillary, frontal sinus tenderness.  Slightly erythematous oropharynx.  Tonsils surgically absent.  Extensive cobblestoning and postnasal drip. Neck: No cervical lymphadenopathy, meningismus Respiratory: Normal inspiratory effort, lungs clear bilaterally, good air movement Cardiovascular: Normal rate, regular rhythm, no murmurs rubs or gallops GI: nondistended skin: No rash, skin intact Musculoskeletal: no deformities Neurologic: Alert & oriented x 3, no focal neuro deficits Psychiatric: Speech and behavior appropriate   ED Course   Medications - No data to display  Orders Placed This Encounter  Procedures   SARS CORONAVIRUS 2 (TAT 6-24 HRS)  Nasopharyngeal Nasopharyngeal Swab    Standing Status:   Standing    Number of Occurrences:   1    Order Specific Question:   Is this test for diagnosis or screening    Answer:   Diagnosis of ill patient    Order Specific Question:   Symptomatic for COVID-19 as defined by CDC    Answer:   Yes    Order Specific Question:   Date of Symptom Onset    Answer:   09/07/2021    Order Specific Question:   Hospitalized for COVID-19    Answer:   No    Order Specific Question:   Admitted to ICU  for COVID-19    Answer:   No    Order Specific Question:   Previously tested for COVID-19    Answer:   Unknown    Order Specific Question:   Resident in a congregate (group) care setting    Answer:   Unknown    Order Specific Question:   Employed in healthcare setting    Answer:   Unknown    Order Specific Question:   Pregnant    Answer:   Unknown    Order Specific Question:   Has patient completed COVID vaccination(s) (2 doses of Pfizer/Moderna 1 dose of [employer])    Answer:   Yes    Order Specific Question:   Has patient completed COVID Booster / 3rd dose    Answer:   Yes    No results found for this or any previous visit (from the past 24 hour(s)). No results found.  Results for orders placed or performed during the hospital encounter of 09/11/21  SARS CORONAVIRUS 2 (TAT 6-24 HRS) Nasopharyngeal Nasopharyngeal Swab   Specimen: Nasopharyngeal Swab  Result Value Ref Range   SARS Coronavirus 2 NEGATIVE NEGATIVE    ED Clinical Impression  1. Viral URI with cough   2. Encounter for laboratory testing for COVID-19 virus      ED Assessment/Plan  [place] Narcotic database reviewed for this patient, and feel that the risk/benefit ratio today is favorable for proceeding with a prescription for controlled substance.  1 opiate prescription in the past 2 years, for tussionex in September 2021  Patient with a viral respiratory infection.  Sending off COVID.  She will be a candidate for antivirals based on her BMI above 30.  We discussed the antiviral options and has opted for Molnupiravir.  We will prescribe Molnupiravir if her COVID is positive.  In the meantime, Tessalon for the cough during the day, Tussionex for the cough at night, Mucinex D, Flonase, Tylenol/ibuprofen, saline nasal irrigation, work note for 2 days.  Follow-up with PMD as needed.  ER return precautions given.  COVID-negative.  Patient with URI.  Supportive treatment as above.  Discussed labs, iMDM, treatment  plan, and plan for follow-up with patient. patient agrees with plan.   Meds ordered this encounter  Medications   ibuprofen (ADVIL) 600 MG tablet    Sig: Take 1 tablet (600 mg total) by mouth every 6 (six) hours as needed.    Dispense:  30 tablet    Refill:  0   chlorpheniramine-HYDROcodone (TUSSIONEX PENNKINETIC ER) 10-8 MG/5ML SUER    Sig: Take 5 mLs by mouth every 12 (twelve) hours as needed for cough.    Dispense:  60 mL    Refill:  0   benzonatate (TESSALON) 200 MG capsule    Sig: Take 1 capsule (200 mg total) by mouth  3 (three) times daily as needed for cough.    Dispense:  30 capsule    Refill:  0   fluticasone (FLONASE) 50 MCG/ACT nasal spray    Sig: Place 2 sprays into both nostrils daily.    Dispense:  16 g    Refill:  0      *This clinic note was created using Scientist, clinical (histocompatibility and immunogenetics). Therefore, there may be occasional mistakes despite careful proofreading.  ?    Domenick Gong, MD 09/12/21 1747

## 2021-09-11 NOTE — Discharge Instructions (Addendum)
I will  prescribe Molnupiravir if your COVID is positive.  In the meantime, Tessalon for the cough during the day, Tussionex for the cough at night, Mucinex D, Flonase, 1000 mg of Tylenol combined with 600 mg of ibuprofen 3-4 times a day as needed, saline nasal irrigation with a Lloyd Huger Med rinse and distilled water as often as you want.

## 2021-09-11 NOTE — ED Triage Notes (Signed)
Pt presents today with c/o of nasal congestion, cough, hoarseness and sore throat since last Friday. Denies fever.

## 2021-11-20 ENCOUNTER — Telehealth: Payer: Medicaid Other | Admitting: Physician Assistant

## 2021-11-20 DIAGNOSIS — B9689 Other specified bacterial agents as the cause of diseases classified elsewhere: Secondary | ICD-10-CM

## 2021-11-20 DIAGNOSIS — J208 Acute bronchitis due to other specified organisms: Secondary | ICD-10-CM

## 2021-11-20 MED ORDER — AZITHROMYCIN 250 MG PO TABS: ORAL_TABLET | ORAL | 0 refills | Status: AC

## 2021-11-20 MED ORDER — PROMETHAZINE-DM 6.25-15 MG/5ML PO SYRP: 5.0000 mL | ORAL_SOLUTION | Freq: Four times a day (QID) | ORAL | 0 refills | Status: DC | PRN

## 2021-11-20 MED ORDER — BENZONATATE 100 MG PO CAPS: 100.0000 mg | ORAL_CAPSULE | Freq: Three times a day (TID) | ORAL | 0 refills | Status: DC | PRN

## 2021-11-20 NOTE — Progress Notes (Signed)
Virtual Visit Consent   SHERREE SHANKMAN, you are scheduled for a virtual visit with a [facility] provider today.     Just as with appointments in the office, your consent must be obtained to participate.  Your consent will be active for this visit and any virtual visit you may have with one of our providers in the next 365 days.     If you have a MyChart account, a copy of this consent can be sent to you electronically.  All virtual visits are billed to your insurance company just like a traditional visit in the office.    As this is a virtual visit, video technology does not allow for your provider to perform a traditional examination.  This may limit your provider's ability to fully assess your condition.  If your provider identifies any concerns that need to be evaluated in person or the need to arrange testing (such as labs, EKG, etc.), we will make arrangements to do so.     Although advances in technology are sophisticated, we cannot ensure that it will always work on either your end or our end.  If the connection with a video visit is poor, the visit may have to be switched to a telephone visit.  With either a video or telephone visit, we are not always able to ensure that we have a secure connection.     I need to obtain your verbal consent now.   Are you willing to proceed with your visit today?    SHEMEKA WARDLE has provided verbal consent on 11/20/2021 for a virtual visit (video or telephone).   [name], PA-C   Date: 11/20/2021 5:43 PM   Virtual Visit via Video Note   I, [name], connected with  GLANDA SPANBAUER  ([id number], [birth year]) on 11/20/21 at  5:15 PM EST by a video-enabled telemedicine application and verified that I am speaking with the correct person using two identifiers.  Location: Patient: Virtual Visit Location Patient: Home Provider: Virtual Visit Location Provider: Home Office   I discussed the limitations of evaluation and management by  telemedicine and the availability of in person appointments. The patient expressed understanding and agreed to proceed.    History of Present Illness: Rachel Ortega is a 25 y.o. who identifies as a female who was assigned female at birth, and is being seen today for cough.  HPI: Cough This is a new problem. The current episode started in the past 7 days (Friday). The problem has been gradually worsening. The problem occurs every few minutes. The cough is Productive of sputum and productive of purulent sputum. Associated symptoms include chills, ear congestion, headaches, nasal congestion, postnasal drip, rhinorrhea and a sore throat. Pertinent negatives include no ear pain, fever, shortness of breath or wheezing. Associated symptoms comments: diaphoresis. The symptoms are aggravated by lying down. Treatments tried: Mucinex, delsym, robitussin DM, Nyquil, Dayquil, cough drops. The treatment provided no relief. There is no history of asthma, bronchitis, environmental allergies or pneumonia.   Had RSV about 2 weeks ago (suspected-daughter was positive and she had symptoms as well).  Problems:  Patient Active Problem List   Diagnosis Date Noted   Acute blood loss anemia 02/07/2018   Thrombocytopenia due to blood loss 02/07/2018   Elevated blood pressure complicating pregnancy, antepartum, third trimester 02/05/2018   Preeclampsia, third trimester 02/05/2018   Pregnancy 10/24/2017   family H/O inherited metabolic disease 24/40/1027   First trimester screening  Supervision of normal first pregnancy, antepartum 07/10/2017    Allergies:  Allergies  Allergen Reactions   Sulfa Antibiotics Other (See Comments)    Headaches  Headaches     Medications:  Current Outpatient Medications:    azithromycin (ZITHROMAX) 250 MG tablet, Take 2 tablets on day 1, then 1 tablet daily on days 2 through 5, Disp: 6 tablet, Rfl: 0   benzonatate (TESSALON) 100 MG capsule, Take 1 capsule (100 mg total) by mouth 3  (three) times daily as needed., Disp: 30 capsule, Rfl: 0   promethazine-dextromethorphan (PROMETHAZINE-DM) 6.25-15 MG/5ML syrup, Take 5 mLs by mouth 4 (four) times daily as needed for cough., Disp: 118 mL, Rfl: 0   fluticasone (FLONASE) 50 MCG/ACT nasal spray, Place 2 sprays into both nostrils daily., Disp: 16 g, Rfl: 0   ibuprofen (ADVIL) 600 MG tablet, Take 1 tablet (600 mg total) by mouth every 6 (six) hours as needed., Disp: 30 tablet, Rfl: 0   sertraline (ZOLOFT) 50 MG tablet, Take 50 mg by mouth daily., Disp: , Rfl:   Observations/Objective: Patient is well-developed, well-nourished in no acute distress.  Resting comfortably at home.  Head is normocephalic, atraumatic.  No labored breathing.  Speech is clear and coherent with logical content.  Patient is alert and oriented at baseline.  Dry, frequent cough heard during call  Assessment and Plan: 1. Acute bacterial bronchitis - promethazine-dextromethorphan (PROMETHAZINE-DM) 6.25-15 MG/5ML syrup; Take 5 mLs by mouth 4 (four) times daily as needed for cough.  Dispense: 118 mL; Refill: 0 - benzonatate (TESSALON) 100 MG capsule; Take 1 capsule (100 mg total) by mouth 3 (three) times daily as needed.  Dispense: 30 capsule; Refill: 0 - azithromycin (ZITHROMAX) 250 MG tablet; Take 2 tablets on day 1, then 1 tablet daily on days 2 through 5  Dispense: 6 tablet; Refill: 0  - Worsening and recurrent issue  - Will treat with zpak, promethazine-DM and tessalon perles. - Push fluids.  - Rest.  - Call if worsening.   Follow Up Instructions: I discussed the assessment and treatment plan with the patient. The patient was provided an opportunity to ask questions and all were answered. The patient agreed with the plan and demonstrated an understanding of the instructions.  A copy of instructions were sent to the patient via MyChart unless otherwise noted below.   The patient was advised to call back or seek an in-person evaluation if the symptoms  worsen or if the condition fails to improve as anticipated.  Time:  I spent 12 minutes with the patient via telehealth technology discussing the above problems/concerns.    [name], PA-C

## 2021-11-20 NOTE — Patient Instructions (Signed)
Rachel Ortega, thank you for joining Rachel Loveless, PA-C for today's virtual visit.  While this provider is not your primary care provider (PCP), if your PCP is located in our provider database this encounter information will be shared with them immediately following your visit.  Consent: (Patient) Rachel Ortega provided verbal consent for this virtual visit at the beginning of the encounter.  Current Medications:  Current Outpatient Medications:    azithromycin (ZITHROMAX) 250 MG tablet, Take 2 tablets on day 1, then 1 tablet daily on days 2 through 5, Disp: 6 tablet, Rfl: 0   benzonatate (TESSALON) 100 MG capsule, Take 1 capsule (100 mg total) by mouth 3 (three) times daily as needed., Disp: 30 capsule, Rfl: 0   promethazine-dextromethorphan (PROMETHAZINE-DM) 6.25-15 MG/5ML syrup, Take 5 mLs by mouth 4 (four) times daily as needed for cough., Disp: 118 mL, Rfl: 0   fluticasone (FLONASE) 50 MCG/ACT nasal spray, Place 2 sprays into both nostrils daily., Disp: 16 g, Rfl: 0   ibuprofen (ADVIL) 600 MG tablet, Take 1 tablet (600 mg total) by mouth every 6 (six) hours as needed., Disp: 30 tablet, Rfl: 0   sertraline (ZOLOFT) 50 MG tablet, Take 50 mg by mouth daily., Disp: , Rfl:    Medications ordered in this encounter:  Meds ordered this encounter  Medications   promethazine-dextromethorphan (PROMETHAZINE-DM) 6.25-15 MG/5ML syrup    Sig: Take 5 mLs by mouth 4 (four) times daily as needed for cough.    Dispense:  118 mL    Refill:  0    Order Specific Question:   Supervising Provider    Answer:   MILLER, BRIAN [[id number]]   benzonatate (TESSALON) 100 MG capsule    Sig: Take 1 capsule (100 mg total) by mouth 3 (three) times daily as needed.    Dispense:  30 capsule    Refill:  0    Order Specific Question:   Supervising Provider    Answer:   MILLER, BRIAN [[id number]]   azithromycin (ZITHROMAX) 250 MG tablet    Sig: Take 2 tablets on day 1, then 1 tablet daily on days 2 through 5    Dispense:   6 tablet    Refill:  0    Order Specific Question:   Supervising Provider    Answer:   Eber Hong [[id number]]     *If you need refills on other medications prior to your next appointment, please contact your pharmacy*  Follow-Up: Call back or seek an in-person evaluation if the symptoms worsen or if the condition fails to improve as anticipated.  Other Instructions Acute Bronchitis, Adult Acute bronchitis is sudden inflammation of the main airways (bronchi) that come off the windpipe (trachea) in the lungs. The swelling causes the airways to get smaller and make more mucus than normal. This can make it hard to breathe and can cause coughing or noisy breathing (wheezing). Acute bronchitis may last several weeks. The cough may last longer. Allergies, asthma, and exposure to smoke may make the condition worse. What are the causes? This condition can be caused by germs and by substances that irritate the lungs, including: Cold and flu viruses. The most common cause of this condition is the virus that causes the common cold. Bacteria. This is less common. Breathing in substances that irritate the lungs, including: Smoke from cigarettes and other forms of tobacco. Dust and pollen. Fumes from household cleaning products, gases, or burned fuel. Indoor or outdoor air pollution. What increases the risk? The  following factors may make you more likely to develop this condition: A weak body's defense system, also called the immune system. A condition that affects your lungs and breathing, such as asthma. What are the signs or symptoms? Common symptoms of this condition include: Coughing. This may bring up clear, yellow, or green mucus from your lungs (sputum). Wheezing. Runny or stuffy nose. Having too much mucus in your lungs (chest congestion). Shortness of breath. Aches and pains, including sore throat or chest. How is this diagnosed? This condition is usually diagnosed based on: Your  symptoms and medical history. A physical exam. You may also have other tests, including tests to rule out other conditions, such as pneumonia. These tests include: A test of lung function. Test of a mucus sample to look for the presence of bacteria. Tests to check the oxygen level in your blood. Blood tests. Chest X-ray. How is this treated? Most cases of acute bronchitis clear up over time without treatment. Your health care provider may recommend: Drinking more fluids to help thin your mucus so it is easier to cough up. Taking inhaled medicine (inhaler) to improve air flow in and out of your lungs. Using a vaporizer or a humidifier. These are machines that add water to the air to help you breathe better. Taking a medicine that thins mucus and clears congestion (expectorant). Taking a medicine that prevents or stops coughing (cough suppressant). It is notcommon to take an antibiotic medicine for this condition. Follow these instructions at home:  Take over-the-counter and prescription medicines only as told by your health care provider. Use an inhaler, vaporizer, or humidifier as told by your health care provider. Take two teaspoons (10 mL) of honey at bedtime to lessen coughing at night. Drink enough fluid to keep your urine pale yellow. Do not use any products that contain nicotine or tobacco. These products include cigarettes, chewing tobacco, and vaping devices, such as e-cigarettes. If you need help quitting, ask your health care provider. Get plenty of rest. Return to your normal activities as told by your health care provider. Ask your health care provider what activities are safe for you. Keep all follow-up visits. This is important. How is this prevented? To lower your risk of getting this condition again: Wash your hands often with soap and water for at least 20 seconds. If soap and water are not available, use hand sanitizer. Avoid contact with people who have cold  symptoms. Try not to touch your mouth, nose, or eyes with your hands. Avoid breathing in smoke or chemical fumes. Breathing smoke or chemical fumes will make your condition worse. Get the flu shot every year. Contact a health care provider if: Your symptoms do not improve after 2 weeks. You have trouble coughing up the mucus. Your cough keeps you awake at night. You have a fever. Get help right away if you: Cough up blood. Feel pain in your chest. Have severe shortness of breath. Faint or keep feeling like you are going to faint. Have a severe headache. Have a fever or chills that get worse. These symptoms may represent a serious problem that is an emergency. Do not wait to see if the symptoms will go away. Get medical help right away. Call your local emergency services (911 in the [place] Do not drive yourself to the hospital. Summary Acute bronchitis is inflammation of the main airways (bronchi) that come off the windpipe (trachea) in the lungs. The swelling causes the airways to get smaller and make more  mucus than normal. Drinking more fluids can help thin your mucus so it is easier to cough up. Take over-the-counter and prescription medicines only as told by your health care provider. Do not use any products that contain nicotine or tobacco. These products include cigarettes, chewing tobacco, and vaping devices, such as e-cigarettes. If you need help quitting, ask your health care provider. Contact a health care provider if your symptoms do not improve after 2 weeks. This information is not intended to replace advice given to you by your health care provider. Make sure you discuss any questions you have with your health care provider. Document Revised: 04/11/2021 Document Reviewed: 04/11/2021 Elsevier Patient Education  2022 [employer].    If you have been instructed to have an in-person evaluation today at a local Urgent Care facility, please use the link below. It will take you  to a list of all of our available [facility], including address, phone number and hours of operation. Please do not delay care.  [facility]  If you or a family member do not have a primary care provider, use the link below to schedule a visit and establish care. When you choose a [facility] primary care physician or advanced practice provider, you gain a long-term partner in health. Find a Primary Care Provider  Learn more about [facility] in-office and virtual care options: [facility] - Get Care Now
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
## 2022-09-04 ENCOUNTER — Ambulatory Visit
Admission: EM | Admit: 2022-09-04 | Discharge: 2022-09-04 | Disposition: A | Discharge status: Home / Self Care | Payer: BC Managed Care – PPO | Attending: Physician Assistant | Admitting: Physician Assistant

## 2022-09-04 DIAGNOSIS — Z9189 Other specified personal risk factors, not elsewhere classified: Secondary | ICD-10-CM

## 2022-09-04 DIAGNOSIS — Z20822 Contact with and (suspected) exposure to covid-19: Secondary | ICD-10-CM | POA: Insufficient documentation

## 2022-09-04 DIAGNOSIS — R519 Headache, unspecified: Secondary | ICD-10-CM | POA: Diagnosis present

## 2022-09-04 LAB — SARS CORONAVIRUS 2 BY RT PCR: SARS Coronavirus 2 by RT PCR: NEGATIVE

## 2022-09-04 NOTE — Discharge Instructions (Addendum)
-  Negative COVID test today but I would take another at home test tomorrow or the following day if you continue to experience more symptoms. -If positive, you will need to isolate 5 days from symptom onset and then wear a mask for 5 days. - Care is supportive with increasing rest and fluids and taking OTC Mucinex or DayQuil/NyQuil, ibuprofen or Tylenol as needed for fever, aches and pains. - Return or go to emergency department for any severe acute worsening of symptoms, uncontrolled fever, weakness, breathing difficulty.

## 2022-09-04 NOTE — ED Triage Notes (Signed)
Patient reports headache x 1 day. Tested at home for COVID with a faint line.

## 2022-09-04 NOTE — ED Provider Notes (Signed)
[facility]    CSN: [id number] Arrival date & time: 09/04/22  1846      History   Chief Complaint Chief Complaint  Patient presents with   Cough    Positive Covid test - Entered by patient   Covid Exposure    HPI Rachel Ortega is a 26 y.o. female presenting for COVID test.  Patient states that she has had a headache for a day and took a COVID test at home today and it was positive with a very faint line but she wants to be retested.  Denies associated fever, fatigue, body aches, cough, sore throat, congestion, chest pain, breathing difficulty, nausea/vomiting or diarrhea.  Taking Allegra.  Medical history significant for depression.  HPI  Past Medical History:  Diagnosis Date   Depression    Medical history non-contributory     Patient Active Problem List   Diagnosis Date Noted   Acute blood loss anemia 02/07/2018   Thrombocytopenia due to blood loss 02/07/2018   Elevated blood pressure complicating pregnancy, antepartum, third trimester 02/05/2018   Preeclampsia, third trimester 02/05/2018   Pregnancy 10/24/2017   family H/O inherited metabolic disease 85/63/1497   First trimester screening    Supervision of normal first pregnancy, antepartum 07/10/2017    Past Surgical History:  Procedure Laterality Date   TONSILLECTOMY Bilateral    TONSILLECTOMY     WISDOM TOOTH EXTRACTION Bilateral     OB History     Gravida  1   Para      Term      Preterm      AB      Living  0      SAB      IAB      Ectopic      Multiple      Live Births               Home Medications    Prior to Admission medications   Medication Sig Start Date End Date Taking? Authorizing Provider  benzonatate (TESSALON) 100 MG capsule Take 1 capsule (100 mg total) by mouth 3 (three) times daily as needed. 11/20/21   [name], PA-C  fluticasone (FLONASE) 50 MCG/ACT nasal spray Place 2 sprays into both nostrils daily. 09/11/21   Domenick Gong,  MD  ibuprofen (ADVIL) 600 MG tablet Take 1 tablet (600 mg total) by mouth every 6 (six) hours as needed. 09/11/21   Domenick Gong, MD  promethazine-dextromethorphan (PROMETHAZINE-DM) 6.25-15 MG/5ML syrup Take 5 mLs by mouth 4 (four) times daily as needed for cough. 11/20/21   [name], PA-C  sertraline (ZOLOFT) 50 MG tablet Take 50 mg by mouth daily. 01/22/19   [provider]    Family History Family History  Problem Relation Age of Onset   Lupus Mother    Rheum arthritis Mother    Healthy Father     Social History Social History   Tobacco Use   Smoking status: Never   Smokeless tobacco: Never  Vaping Use   Vaping Use: Never used  Substance Use Topics   Alcohol use: Yes    Comment: social   Drug use: No     Allergies   Sulfa antibiotics   Review of Systems Review of Systems  Constitutional:  Negative for chills, diaphoresis, fatigue and fever.  HENT:  Negative for congestion, ear pain, rhinorrhea, sinus pressure, sinus pain and sore throat.   Respiratory:  Negative for cough and shortness of breath.  Gastrointestinal:  Negative for abdominal pain, nausea and vomiting.  Musculoskeletal:  Negative for arthralgias and myalgias.  Skin:  Negative for rash.  Neurological:  Positive for headaches. Negative for weakness.  Hematological:  Negative for adenopathy.     Physical Exam Triage Vital Signs ED Triage Vitals  Enc Vitals Group     BP      Pulse      Resp      Temp      Temp src      SpO2      Weight      Height      Head Circumference      Peak Flow      Pain Score      Pain Loc      Pain Edu?      Excl. in GC?    No data found.  Updated Vital Signs BP 111/71 (BP Location: Right Arm)   Pulse 90   Temp 99.2 F (37.3 C) (Oral)   Resp 18   Ht 5\' 6"  (1.676 m)   Wt 200 lb (90.7 kg)   SpO2 100%   BMI 32.28 kg/m   Physical Exam Vitals and nursing note reviewed.  Constitutional:      General: She is not in acute  distress.    Appearance: Normal appearance. She is not ill-appearing or toxic-appearing.  HENT:     Head: Normocephalic and atraumatic.     Nose: Nose normal.     Mouth/Throat:     Mouth: Mucous membranes are moist.     Pharynx: Oropharynx is clear.  Eyes:     General: No scleral icterus.       Right eye: No discharge.        Left eye: No discharge.     Conjunctiva/sclera: Conjunctivae normal.  Cardiovascular:     Rate and Rhythm: Normal rate and regular rhythm.     Heart sounds: Normal heart sounds.  Pulmonary:     Effort: Pulmonary effort is normal. No respiratory distress.     Breath sounds: Normal breath sounds.  Musculoskeletal:     Cervical back: Neck supple.  Skin:    General: Skin is dry.  Neurological:     General: No focal deficit present.     Mental Status: She is alert. Mental status is at baseline.     Motor: No weakness.     Gait: Gait normal.  Psychiatric:        Mood and Affect: Mood normal.        Behavior: Behavior normal.        Thought Content: Thought content normal.      UC Treatments / Results  Labs (all labs ordered are listed, but only abnormal results are displayed) Labs Reviewed  SARS CORONAVIRUS 2 BY RT PCR    EKG   Radiology No results found.  Procedures Procedures (including critical care time)  Medications Ordered in UC Medications - No data to display  Initial Impression / Assessment and Plan / UC Course  I have reviewed the triage vital signs and the nursing notes.  Pertinent labs & imaging results that were available during my care of the patient were reviewed by me and considered in my medical decision making (see chart for details).   26 y/o female presents for headache x 1 day and a positive at home COVID test. Denies other symptoms. Grandmother is ill with similar symptoms.   Vitals are stable and patient is overall  well appearing.  No congestion or erythema posterior pharynx.  Chest clear auscultation heart regular  rate and rhythm.  PCR COVID test obtained.  Negative.  Advised patient to retest with another home test tomorrow or the following day.  May be positive later as she has been exposed to her grandmother who had a definite positive today in the clinic.  Review current CDC guidelines, isolation protocol and ED precautions for COVID.  Supportive care advised with increasing rest and fluids and taking OTC decongestants if needed, Tylenol or Motrin if needed for headaches, fever or body aches.  Work note given.  Follow-up as needed.   Final Clinical Impressions(s) / UC Diagnoses   Final diagnoses:  Acute nonintractable headache, unspecified headache type  At increased risk of exposure to COVID-19 virus     Discharge Instructions      -Negative COVID test today but I would take another at home test tomorrow or the following day if you continue to experience more symptoms. -If positive, you will need to isolate 5 days from symptom onset and then wear a mask for 5 days. - Care is supportive with increasing rest and fluids and taking OTC Mucinex or DayQuil/NyQuil, ibuprofen or Tylenol as needed for fever, aches and pains. - Return or go to emergency department for any severe acute worsening of symptoms, uncontrolled fever, weakness, breathing difficulty.     ED Prescriptions   None    PDMP not reviewed this encounter.   Shirlee Latch, PA-C 09/04/22 2001

## 2022-12-23 NOTE — L&D Delivery Note (Signed)
Delivery Note At 5:35 PM a viable female was delivered via Vaginal, Spontaneous (Presentation:   vtx/loa   ).  APGAR: 9, 9; weight 7 lb 10.1 oz (3460 g).   Placenta status: Spontaneous, Intact.  Cord: 3 vessels with the following complications: None.  Cord pH: n/a Mother pushed for 30 minutes with good effort . Head delivered followed by shoulders and body without complication . Vigorous female dried on abdomen by nursery staff. Placenta delivered 10 minutes later . + uterine atony will Iv pitocin going in. I gave 0.2 mg Im Methergine . 30 minutes PP passed golf ball clot . I ordered 1000 mg TXA  First degree laceration repaired with 00+000 vicryl  Anesthesia: Epidural Episiotomy: None Lacerations: 1st degree Suture Repair: 2.0 3.0 vicryl Est. Blood Loss (mL):   1300 cc ebl  Mom to postpartum.  Baby to Nursery.  Rachel Ortega 11/11/2023, 6:01 PM

## 2023-04-22 DIAGNOSIS — O0992 Supervision of high risk pregnancy, unspecified, second trimester: Secondary | ICD-10-CM | POA: Insufficient documentation

## 2023-05-07 LAB — OB RESULTS CONSOLE HEPATITIS B SURFACE ANTIGEN: Hepatitis B Surface Ag: NEGATIVE

## 2023-05-07 LAB — OB RESULTS CONSOLE VARICELLA ZOSTER ANTIBODY, IGG: Varicella: IMMUNE

## 2023-05-07 LAB — OB RESULTS CONSOLE RUBELLA ANTIBODY, IGM: Rubella: IMMUNE

## 2023-05-26 ENCOUNTER — Other Ambulatory Visit: Payer: Self-pay

## 2023-05-26 ENCOUNTER — Other Ambulatory Visit: Payer: Self-pay | Admitting: Obstetrics

## 2023-05-26 DIAGNOSIS — Z363 Encounter for antenatal screening for malformations: Secondary | ICD-10-CM

## 2023-06-11 ENCOUNTER — Ambulatory Visit: Payer: BC Managed Care – PPO

## 2023-06-27 ENCOUNTER — Other Ambulatory Visit: Payer: Self-pay | Admitting: Obstetrics

## 2023-06-27 DIAGNOSIS — Z363 Encounter for antenatal screening for malformations: Secondary | ICD-10-CM

## 2023-06-27 DIAGNOSIS — O09292 Supervision of pregnancy with other poor reproductive or obstetric history, second trimester: Secondary | ICD-10-CM

## 2023-06-30 ENCOUNTER — Ambulatory Visit: Payer: BC Managed Care – PPO | Attending: Maternal & Fetal Medicine

## 2023-06-30 ENCOUNTER — Ambulatory Visit (HOSPITAL_BASED_OUTPATIENT_CLINIC_OR_DEPARTMENT_OTHER): Payer: BC Managed Care – PPO | Admitting: Maternal & Fetal Medicine

## 2023-06-30 ENCOUNTER — Other Ambulatory Visit: Payer: Self-pay

## 2023-06-30 DIAGNOSIS — O09292 Supervision of pregnancy with other poor reproductive or obstetric history, second trimester: Secondary | ICD-10-CM | POA: Diagnosis not present

## 2023-06-30 DIAGNOSIS — Z79899 Other long term (current) drug therapy: Secondary | ICD-10-CM | POA: Diagnosis not present

## 2023-06-30 DIAGNOSIS — Z363 Encounter for antenatal screening for malformations: Secondary | ICD-10-CM | POA: Diagnosis not present

## 2023-06-30 DIAGNOSIS — Z3A19 19 weeks gestation of pregnancy: Secondary | ICD-10-CM | POA: Diagnosis not present

## 2023-06-30 DIAGNOSIS — F32A Depression, unspecified: Secondary | ICD-10-CM | POA: Diagnosis not present

## 2023-06-30 DIAGNOSIS — O09891 Supervision of other high risk pregnancies, first trimester: Secondary | ICD-10-CM

## 2023-06-30 DIAGNOSIS — O99342 Other mental disorders complicating pregnancy, second trimester: Secondary | ICD-10-CM | POA: Diagnosis not present

## 2023-06-30 DIAGNOSIS — Z3A18 18 weeks gestation of pregnancy: Secondary | ICD-10-CM | POA: Diagnosis not present

## 2023-06-30 DIAGNOSIS — O358XX0 Maternal care for other (suspected) fetal abnormality and damage, not applicable or unspecified: Secondary | ICD-10-CM | POA: Diagnosis not present

## 2023-06-30 DIAGNOSIS — O09892 Supervision of other high risk pregnancies, second trimester: Secondary | ICD-10-CM | POA: Diagnosis present

## 2023-06-30 DIAGNOSIS — O1493 Unspecified pre-eclampsia, third trimester: Secondary | ICD-10-CM

## 2023-06-30 DIAGNOSIS — O99212 Obesity complicating pregnancy, second trimester: Secondary | ICD-10-CM | POA: Insufficient documentation

## 2023-06-30 DIAGNOSIS — E669 Obesity, unspecified: Secondary | ICD-10-CM

## 2023-07-02 NOTE — Progress Notes (Signed)
MFM Consultation.  Rachel Ortega is a 27 yo G2P1 at 17 w 5 d with an EDD of 11/26/23. She is seen at the request of Chari Manning CNM regarding medication exposure.  She is overall doing well without complaints.  She has normal prenatal labs, NIPT and 1hr GTT.  Regarding her medication exposure she is currently taking the lowest dose of Effexor since her prior pregnancy > 3 yrs now. She was treated for postpartum depression. She was initially treated with Zoloft but then later switched to Effexor for unknown reason. She reports that it has improved her quality of life and would not want to discontinue it as she was not doing well without out it. She reports that her mood is at times depressed but she is able to function and enjoy life as a whole.   Her other pregnancy related concerns is later term preeclampsia in which she is taking low dose ASA for prevention.     06/30/2023   10:04 AM 06/30/2023   10:00 AM 09/04/2022    7:25 PM  Vitals with BMI  Height  5\' 6"    Weight 212 lbs 8 oz 212 lbs 8 oz   BMI 34.31 34.31   Systolic 115 115 161  Diastolic 74 74 71  Pulse 110 110    OB History  Gravida Para Term Preterm AB Living  2 1 1     1   SAB IAB Ectopic Multiple Live Births          1    # Outcome Date GA Lbr Len/2nd Weight Sex Delivery Anes PTL Lv  2 Current           1 Term 02/06/18 [redacted]w[redacted]d   F Vag-Spont EPI  LIV     Complications: Pre-eclampsia   Family History  Problem Relation Age of Onset   Lupus Mother    Rheum arthritis Mother    Healthy Father    Cancer Maternal Grandfather    Past Surgical History:  Procedure Laterality Date   TONSILLECTOMY Bilateral    TONSILLECTOMY     WISDOM TOOTH EXTRACTION Bilateral    Past Medical History:  Diagnosis Date   Depression    Medical history non-contributory    Social History   Socioeconomic History   Marital status: Single    Spouse name: Clint   Number of children: 1   Years of education: Not on file   Highest education  level: Associate degree: academic program  Occupational History   Occupation: Scientist, research (medical): [employer]  Tobacco Use   Smoking status: Never   Smokeless tobacco: Never  Vaping Use   Vaping Use: Never used  Substance and Sexual Activity   Alcohol use: Not Currently    Comment: social   Drug use: No   Sexual activity: Yes    Birth control/protection: None  Other Topics Concern   Not on file  Social History Narrative   Not on file   Social Determinants of Health   Financial Resource Strain: Not on file  Food Insecurity: Not on file  Transportation Needs: Not on file  Physical Activity: Not on file  Stress: Not on file  Social Connections: Not on file  Intimate Partner Violence: Not on file       Current Outpatient Medications (Analgesics):    aspirin EC 81 MG tablet, Take 81 mg by mouth daily. Swallow whole.   Current Outpatient Medications (Other):    doxylamine, Sleep, (UNISOM) 25 MG  tablet, Take 25 mg by mouth at bedtime as needed for sleep.   Pediatric Multivit-Minerals (FLINTSTONES GUMMIES PO), Take 2 tablets by mouth once.   venlafaxine (EFFEXOR) 37.5 MG tablet, Take 37.5 mg by mouth once. Allergies  Allergen Reactions   Shrimp (Diagnostic)    Sulfa Antibiotics Other (See Comments)    Headaches  Headaches     Imaging:  Single intrauterine pregnancy with measurements consistent with dates. Normal anatomy with no markers of aneuploidy. Good fetal movement and amniotic fluid volume Suboptimal views of the fetal anatomy was obtained secondary to fetal position.  Follow up growth and anatomy can be performed at our offices or her providers offices in 4 weeks.    Impression/Counseling:  Early exposure to SSRI's.  Overall, data regarding the safety of SSRI in pregnancy appear to be reassuring. There may be a slight increased risk of teratogenic effects, including cardiovascular defects, however, available information is conflicting. Nonteratogenic  effects in the newborn following SSRI exposure late in the third trimester include respiratory distress, cyanosis, apnea, seizures, temperature instability, feeding difficulty, vomiting, hypoglycemia, hypo- or hypertonia, hyper-reflexia, jitteriness, irritability, constant crying, and tremor. Symptoms may be due to the toxicity of the SSRIs or a discontinuation syndrome and may be consistent with serotonin syndrome associated with SSRI treatment. Persistent pulmonary hypertension of the newborn (PPHN) has also been reported as well. Data regarding the long-term effects of in utero SSRI exposure on infant development are reassuring.    Due to pregnancy-induced physiologic changes, women who are pregnant may require dose adjustments of SSRI to achieve euthymia. ACOG recommends that therapy with SSRIs or SNRIs during pregnancy be individualized; treatment of depression during pregnancy should incorporate the clinical expertise of the mental health clinician, obstetrician, primary healthcare provider, and pediatrician. According to the American Psychiatric Association (APA), the risks of medication treatment should be weighed against other treatment options and untreated depression.   For women who discontinue antidepressant medications during pregnancy and who may be at high risk for postpartum depression, the medications can be restarted leading up to or following delivery.   Treatment algorithms have been developed by the ACOG and the APA for the management of depression in women prior to conception and during pregnancy as well and may be used to guide management.   At this time I discussed with Rachel Ortega the principle of lowest effective dose, which she says she is on. In addition, she expressed an understanding of risk for SSRI's and wish to continue to be functional for her family. In addition, she is planning to start psychotherapy for additional support.  All questions answered   I spent 60 minutes  in consultation, review of records and care coordination.  All questions answered.  Novella Olive, MD

## 2023-07-14 DIAGNOSIS — R768 Other specified abnormal immunological findings in serum: Secondary | ICD-10-CM | POA: Diagnosis present

## 2023-07-14 DIAGNOSIS — F419 Anxiety disorder, unspecified: Secondary | ICD-10-CM | POA: Insufficient documentation

## 2023-07-14 DIAGNOSIS — O09299 Supervision of pregnancy with other poor reproductive or obstetric history, unspecified trimester: Secondary | ICD-10-CM | POA: Insufficient documentation

## 2023-07-23 ENCOUNTER — Other Ambulatory Visit: Payer: Self-pay | Admitting: Maternal & Fetal Medicine

## 2023-07-23 DIAGNOSIS — Z362 Encounter for other antenatal screening follow-up: Secondary | ICD-10-CM

## 2023-07-23 DIAGNOSIS — O99212 Obesity complicating pregnancy, second trimester: Secondary | ICD-10-CM

## 2023-07-28 ENCOUNTER — Other Ambulatory Visit: Payer: Self-pay

## 2023-07-28 DIAGNOSIS — O99210 Obesity complicating pregnancy, unspecified trimester: Secondary | ICD-10-CM

## 2023-07-28 DIAGNOSIS — O09891 Supervision of other high risk pregnancies, first trimester: Secondary | ICD-10-CM

## 2023-07-28 DIAGNOSIS — O1493 Unspecified pre-eclampsia, third trimester: Secondary | ICD-10-CM

## 2023-07-30 ENCOUNTER — Other Ambulatory Visit: Payer: Self-pay

## 2023-07-30 ENCOUNTER — Ambulatory Visit: Payer: BC Managed Care – PPO | Attending: Maternal & Fetal Medicine

## 2023-07-30 DIAGNOSIS — O09892 Supervision of other high risk pregnancies, second trimester: Secondary | ICD-10-CM | POA: Insufficient documentation

## 2023-07-30 DIAGNOSIS — Z3A23 23 weeks gestation of pregnancy: Secondary | ICD-10-CM | POA: Insufficient documentation

## 2023-07-30 DIAGNOSIS — E669 Obesity, unspecified: Secondary | ICD-10-CM

## 2023-07-30 DIAGNOSIS — O35EXX0 Maternal care for other (suspected) fetal abnormality and damage, fetal genitourinary anomalies, not applicable or unspecified: Secondary | ICD-10-CM | POA: Insufficient documentation

## 2023-07-30 DIAGNOSIS — O09292 Supervision of pregnancy with other poor reproductive or obstetric history, second trimester: Secondary | ICD-10-CM | POA: Diagnosis not present

## 2023-07-30 DIAGNOSIS — Z79899 Other long term (current) drug therapy: Secondary | ICD-10-CM | POA: Diagnosis not present

## 2023-07-30 DIAGNOSIS — Z362 Encounter for other antenatal screening follow-up: Secondary | ICD-10-CM | POA: Insufficient documentation

## 2023-07-30 DIAGNOSIS — O99212 Obesity complicating pregnancy, second trimester: Secondary | ICD-10-CM | POA: Insufficient documentation

## 2023-08-20 ENCOUNTER — Observation Stay
Admission: EM | Admit: 2023-08-20 | Discharge: 2023-08-20 | Disposition: A | Discharge status: Home / Self Care | Payer: BC Managed Care – PPO | Attending: Certified Nurse Midwife | Admitting: Certified Nurse Midwife

## 2023-08-20 DIAGNOSIS — Z87440 Personal history of urinary (tract) infections: Principal | ICD-10-CM | POA: Diagnosis present

## 2023-08-20 DIAGNOSIS — U071 COVID-19: Secondary | ICD-10-CM | POA: Insufficient documentation

## 2023-08-20 DIAGNOSIS — R351 Nocturia: Secondary | ICD-10-CM | POA: Insufficient documentation

## 2023-08-20 DIAGNOSIS — Z3A26 26 weeks gestation of pregnancy: Secondary | ICD-10-CM | POA: Insufficient documentation

## 2023-08-20 DIAGNOSIS — O98512 Other viral diseases complicating pregnancy, second trimester: Secondary | ICD-10-CM | POA: Insufficient documentation

## 2023-08-20 DIAGNOSIS — O26892 Other specified pregnancy related conditions, second trimester: Secondary | ICD-10-CM | POA: Diagnosis present

## 2023-08-20 LAB — URINALYSIS, COMPLETE (UACMP) WITH MICROSCOPIC
Bilirubin Urine: NEGATIVE
Glucose, UA: NEGATIVE mg/dL
Hgb urine dipstick: NEGATIVE
Ketones, ur: NEGATIVE mg/dL
Leukocytes,Ua: NEGATIVE
Nitrite: NEGATIVE
Protein, ur: NEGATIVE mg/dL
Specific Gravity, Urine: 1.013 (ref 1.005–1.030)
pH: 6 (ref 5.0–8.0)

## 2023-08-20 LAB — CBC WITH DIFFERENTIAL/PLATELET
Abs Immature Granulocytes: 0.09 10*3/uL — ABNORMAL HIGH (ref 0.00–0.07)
Basophils Absolute: 0 10*3/uL (ref 0.0–0.1)
Basophils Relative: 0 %
Eosinophils Absolute: 0.1 10*3/uL (ref 0.0–0.5)
Eosinophils Relative: 1 %
HCT: 35 % — ABNORMAL LOW (ref 36.0–46.0)
Hemoglobin: 11.4 g/dL — ABNORMAL LOW (ref 12.0–15.0)
Immature Granulocytes: 1 %
Lymphocytes Relative: 15 %
Lymphs Abs: 1.6 10*3/uL (ref 0.7–4.0)
MCH: 30.9 pg (ref 26.0–34.0)
MCHC: 32.6 g/dL (ref 30.0–36.0)
MCV: 94.9 fL (ref 80.0–100.0)
Monocytes Absolute: 0.6 10*3/uL (ref 0.1–1.0)
Monocytes Relative: 6 %
Neutro Abs: 8.7 10*3/uL — ABNORMAL HIGH (ref 1.7–7.7)
Neutrophils Relative %: 77 %
Platelets: 250 10*3/uL (ref 150–400)
RBC: 3.69 MIL/uL — ABNORMAL LOW (ref 3.87–5.11)
RDW: 13.5 % (ref 11.5–15.5)
WBC: 11.1 10*3/uL — ABNORMAL HIGH (ref 4.0–10.5)
nRBC: 0 % (ref 0.0–0.2)

## 2023-08-20 NOTE — Discharge Summary (Signed)
Patient ID: Rachel Ortega MRN: [id number] DOB/AGE: [birth year] 27 y.o.  Admit date: 08/20/2023 Discharge date: 08/20/2023  Admission Diagnoses: 27yo G2P1 at [redacted]w[redacted]d was sent in from the office.  On 08/15/23 the pt had a negative UA and culture.  These labs were done by Rheumatology for symptoms of nocturia, frequency and leaking.  Pt agreed to do another clean catch urine sample today.  She also reported recent dizziness and requested a CBC.  Discharge Diagnoses: Continued nocturia, frequency and leaking despite negative UA.  CBC WNL.  Factors complicating pregnancy: Obesity BMI: 35 History of HSV 1 History of abnormal pap smear Positive ANA H/o mental health diagnoses: Depression/Anxiety H/o Preeclampsia, G1  H/o Sulcus Tear in G1 Delivery Covid in 2nd trimester (07/31/23)  Prenatal Procedures: None  Consults: None  Significant Diagnostic Studies:  Results for orders placed or performed during the hospital encounter of 08/20/23 (from the past 168 hour(s))  CBC with Differential/Platelet   Collection Time: 08/20/23  6:33 PM  Result Value Ref Range   WBC 11.1 (H) 4.0 - 10.5 K/uL   RBC 3.69 (L) 3.87 - 5.11 MIL/uL   Hemoglobin 11.4 (L) 12.0 - 15.0 g/dL   HCT 66.4 (L) 40.3 - 47.4 %   MCV 94.9 80.0 - 100.0 fL   MCH 30.9 26.0 - 34.0 pg   MCHC 32.6 30.0 - 36.0 g/dL   RDW 25.9 56.3 - 87.5 %   Platelets 250 150 - 400 K/uL   nRBC 0.0 0.0 - 0.2 %   Neutrophils Relative % 77 %   Neutro Abs 8.7 (H) 1.7 - 7.7 K/uL   Lymphocytes Relative 15 %   Lymphs Abs 1.6 0.7 - 4.0 K/uL   Monocytes Relative 6 %   Monocytes Absolute 0.6 0.1 - 1.0 K/uL   Eosinophils Relative 1 %   Eosinophils Absolute 0.1 0.0 - 0.5 K/uL   Basophils Relative 0 %   Basophils Absolute 0.0 0.0 - 0.1 K/uL   Immature Granulocytes 1 %   Abs Immature Granulocytes 0.09 (H) 0.00 - 0.07 K/uL  Urinalysis, Complete w Microscopic -Urine, Clean Catch   Collection Time: 08/20/23  6:33 PM  Result Value Ref Range   Color, Urine YELLOW  (A) YELLOW   APPearance CLEAR (A) CLEAR   Specific Gravity, Urine 1.013 1.005 - 1.030   pH 6.0 5.0 - 8.0   Glucose, UA NEGATIVE NEGATIVE mg/dL   Hgb urine dipstick NEGATIVE NEGATIVE   Bilirubin Urine NEGATIVE NEGATIVE   Ketones, ur NEGATIVE NEGATIVE mg/dL   Protein, ur NEGATIVE NEGATIVE mg/dL   Nitrite NEGATIVE NEGATIVE   Leukocytes,Ua NEGATIVE NEGATIVE   WBC, UA 0-5 0 - 5 WBC/hpf   Bacteria, UA RARE (A) NONE SEEN   Squamous Epithelial / HPF 0-5 0 - 5 /HPF   Mucus PRESENT     Treatments: none  Hospital Course:  Catheterized urine sample collected, NST complete and reactive.  Discharge Physical Exam:  LMP 02/17/2023   General: NAD CV: RRR Pulm: CTABL, nl effort ABD: s/nd/nt, gravid DVT Evaluation: LE non-ttp, no evidence of DVT on exam.   FHT normal for GA TOCO: quiet SVE: deferred      Discharge Condition: Stable  Disposition: Discharge disposition: 01-Home or Self Care        Allergies as of 08/20/2023       Reactions   Codeine Hives, Itching   Shrimp (diagnostic)    Sulfa Antibiotics Other (See Comments)   Headaches  Headaches  Medication List     TAKE these medications    aspirin EC 81 MG tablet Take 81 mg by mouth daily. Swallow whole.   doxylamine (Sleep) 25 MG tablet Commonly known as: UNISOM Take 25 mg by mouth at bedtime as needed for sleep.   FLINTSTONES GUMMIES PO Take 2 tablets by mouth once.   venlafaxine 37.5 MG tablet Commonly known as: EFFEXOR Take 37.5 mg by mouth once.        Follow-up Information     [facility] Follow up.   Why: Keep all scheduled appointments Contact information: [address] [place] [address] [phone]                Signed:  Quillian Quince 08/20/2023 7:59 PM

## 2023-08-20 NOTE — OB Triage Note (Signed)
Pt is unaware of reasoning for referral to L&D. After discussing, it is likely that there is confusion around pt's urinary symptoms and recent lab results. Pt reports that she is peeing frequently and experiences urinary leakage. She states that her urine has a foul odor. Pt denies pain when urinating and denies having issues starting the flow of urine.

## 2023-08-20 NOTE — Progress Notes (Signed)
RN discussed urinalysis results with pt at length. RN reiterated that her primary OBGYN informed her that her UA was not diagnostic of a UTI. RN encouraged pt to make an appointment with [facility] if she wanted to review her lab results one line item at a time. After extensive discussion about her plan of care, pt requested CBC and clean catch UA. RN discussed with CNM. CNM supports plan of care.

## 2023-08-20 NOTE — Progress Notes (Signed)
   08/20/23 1848  Fetal Heart Rate A  Mode External  Baseline Rate (A) 155 bpm  Variability 6-25 BPM  Accelerations 15 x 15  Decelerations None  Uterine Activity  Mode Toco  Contraction Frequency (min) none   Category 1 tracing, reactive NST.

## 2023-08-22 LAB — URINE CULTURE: Culture: <10000 — AB

## 2023-09-01 ENCOUNTER — Ambulatory Visit: Payer: BC Managed Care – PPO | Attending: Obstetrics and Gynecology

## 2023-09-01 ENCOUNTER — Other Ambulatory Visit: Payer: Self-pay

## 2023-09-01 DIAGNOSIS — O1493 Unspecified pre-eclampsia, third trimester: Secondary | ICD-10-CM

## 2023-09-01 DIAGNOSIS — Z3A28 28 weeks gestation of pregnancy: Secondary | ICD-10-CM | POA: Diagnosis not present

## 2023-09-01 DIAGNOSIS — O99213 Obesity complicating pregnancy, third trimester: Secondary | ICD-10-CM | POA: Diagnosis not present

## 2023-09-01 DIAGNOSIS — Q614 Renal dysplasia: Secondary | ICD-10-CM | POA: Insufficient documentation

## 2023-09-01 DIAGNOSIS — O09293 Supervision of pregnancy with other poor reproductive or obstetric history, third trimester: Secondary | ICD-10-CM | POA: Insufficient documentation

## 2023-09-01 DIAGNOSIS — O35EXX0 Maternal care for other (suspected) fetal abnormality and damage, fetal genitourinary anomalies, not applicable or unspecified: Secondary | ICD-10-CM

## 2023-09-01 DIAGNOSIS — O99210 Obesity complicating pregnancy, unspecified trimester: Secondary | ICD-10-CM

## 2023-09-01 DIAGNOSIS — Z362 Encounter for other antenatal screening follow-up: Secondary | ICD-10-CM | POA: Insufficient documentation

## 2023-09-01 DIAGNOSIS — E669 Obesity, unspecified: Secondary | ICD-10-CM | POA: Diagnosis not present

## 2023-09-01 DIAGNOSIS — O09891 Supervision of other high risk pregnancies, first trimester: Secondary | ICD-10-CM

## 2023-09-15 ENCOUNTER — Other Ambulatory Visit: Payer: Self-pay

## 2023-09-15 ENCOUNTER — Emergency Department
Admission: EM | Admit: 2023-09-15 | Discharge: 2023-09-16 | Discharge status: Against Medical Advice | Payer: BC Managed Care – PPO | Attending: Emergency Medicine | Admitting: Emergency Medicine

## 2023-09-15 DIAGNOSIS — Z5321 Procedure and treatment not carried out due to patient leaving prior to being seen by health care provider: Secondary | ICD-10-CM | POA: Insufficient documentation

## 2023-09-15 DIAGNOSIS — O2686 Pruritic urticarial papules and plaques of pregnancy (PUPPP): Secondary | ICD-10-CM | POA: Diagnosis not present

## 2023-09-15 DIAGNOSIS — L500 Allergic urticaria: Secondary | ICD-10-CM | POA: Insufficient documentation

## 2023-09-15 DIAGNOSIS — O99713 Diseases of the skin and subcutaneous tissue complicating pregnancy, third trimester: Secondary | ICD-10-CM | POA: Diagnosis present

## 2023-09-15 NOTE — ED Triage Notes (Signed)
Pt presents to ER with c/o allergic rxn that started this AM, and has been ongoing throughout the day.  Pt has hives/rash scattered all over her body  Unknown what pt came into contact with that she would be allergic too.  Pt states she was seen at [facility] today for same, and was given Claritin, benadryl and prednisone without relief.  Pt states the itching has not stopped.  Denies any SOB, or throat swelling.  Pt reports she is also [redacted] weeks pregnant.  Denies any pregnancy related issues.  Pt otherwise A&O x4 and in NAD at this time.

## 2023-09-15 NOTE — ED Notes (Addendum)
FHT 160 - obtained via doppler.

## 2023-09-16 NOTE — ED Notes (Signed)
No answer when called several times from lobby 

## 2023-09-19 ENCOUNTER — Other Ambulatory Visit: Payer: Self-pay

## 2023-09-19 ENCOUNTER — Observation Stay
Admission: EM | Admit: 2023-09-19 | Discharge: 2023-09-19 | Disposition: A | Discharge status: Home / Self Care | Payer: BC Managed Care – PPO | Attending: Obstetrics and Gynecology | Admitting: Obstetrics and Gynecology

## 2023-09-19 ENCOUNTER — Encounter: Payer: Self-pay | Admitting: Obstetrics and Gynecology

## 2023-09-19 DIAGNOSIS — R21 Rash and other nonspecific skin eruption: Principal | ICD-10-CM | POA: Diagnosis present

## 2023-09-19 DIAGNOSIS — Z79899 Other long term (current) drug therapy: Secondary | ICD-10-CM | POA: Diagnosis not present

## 2023-09-19 DIAGNOSIS — Z7982 Long term (current) use of aspirin: Secondary | ICD-10-CM | POA: Insufficient documentation

## 2023-09-19 DIAGNOSIS — O368130 Decreased fetal movements, third trimester, not applicable or unspecified: Principal | ICD-10-CM | POA: Insufficient documentation

## 2023-09-19 DIAGNOSIS — Z8616 Personal history of COVID-19: Secondary | ICD-10-CM | POA: Insufficient documentation

## 2023-09-19 DIAGNOSIS — Z3A30 30 weeks gestation of pregnancy: Secondary | ICD-10-CM | POA: Diagnosis not present

## 2023-09-19 LAB — CBC
HCT: 31 % — ABNORMAL LOW (ref 36.0–46.0)
Hemoglobin: 10.3 g/dL — ABNORMAL LOW (ref 12.0–15.0)
MCH: 30.6 pg (ref 26.0–34.0)
MCHC: 33.2 g/dL (ref 30.0–36.0)
MCV: 92 fL (ref 80.0–100.0)
Platelets: 257 10*3/uL (ref 150–400)
RBC: 3.37 MIL/uL — ABNORMAL LOW (ref 3.87–5.11)
RDW: 13.2 % (ref 11.5–15.5)
WBC: 12.6 10*3/uL — ABNORMAL HIGH (ref 4.0–10.5)
nRBC: 0 % (ref 0.0–0.2)

## 2023-09-19 LAB — COMPREHENSIVE METABOLIC PANEL
ALT: 10 U/L (ref 0–44)
AST: 12 U/L — ABNORMAL LOW (ref 15–41)
Albumin: 2.6 g/dL — ABNORMAL LOW (ref 3.5–5.0)
Alkaline Phosphatase: 64 U/L (ref 38–126)
Anion gap: 10 (ref 5–15)
BUN: 8 mg/dL (ref 6–20)
CO2: 21 mmol/L — ABNORMAL LOW (ref 22–32)
Calcium: 8.5 mg/dL — ABNORMAL LOW (ref 8.9–10.3)
Chloride: 104 mmol/L (ref 98–111)
Creatinine, Ser: 0.52 mg/dL (ref 0.44–1.00)
GFR, Estimated: >60 mL/min (ref >60)
Glucose, Bld: 190 mg/dL — ABNORMAL HIGH (ref 70–99)
Potassium: 3.7 mmol/L (ref 3.5–5.1)
Sodium: 135 mmol/L (ref 135–145)
Total Bilirubin: 0.6 mg/dL (ref 0.3–1.2)
Total Protein: 6.5 g/dL (ref 6.5–8.1)

## 2023-09-19 MED ORDER — HYDROCORTISONE 1 % EX CREA: TOPICAL_CREAM | Freq: Two times a day (BID) | CUTANEOUS | 0 refills | Status: DC

## 2023-09-19 MED ORDER — HYDROCORTISONE 1 % EX CREA
TOPICAL_CREAM | Freq: Two times a day (BID) | CUTANEOUS | Status: DC
  Filled 2023-09-19: qty 28

## 2023-09-19 MED ORDER — FERROUS SULFATE 325 (65 FE) MG PO TABS: 325.0000 mg | ORAL_TABLET | Freq: Every day | ORAL | Status: DC

## 2023-09-19 NOTE — H&P (Signed)
Rachel Ortega is a 27 y.o. female. She is at [redacted]w[redacted]d gestation. Patient's last menstrual period was 02/17/2023. Estimated Date of Delivery: 11/24/23  Prenatal care site: [facility]  Chief complaint: rash and decreased FM  HPI: Rachel Ortega presents to L&D with complaints of rash and decreased FM  Factors complicating pregnancy: Obesity BMI: 35 History of HSV 1 History of abnormal pap smear Positive ANA H/o mental health diagnoses: Depression/Anxiety H/o Preeclampsia, G1  H/o Sulcus Tear in G1 Delivery Covid in 2nd trimester (07/31/23)    S: Resting comfortably. no CTX, no VB.no LOF,  Active fetal movement.   Maternal Medical History:  Past Medical Hx:  has a past medical history of Depression and Medical history non-contributory.    Past Surgical Hx:  has a past surgical history that includes Tonsillectomy (Bilateral); Wisdom tooth extraction (Bilateral); and Tonsillectomy.   Allergies  Allergen Reactions   Codeine Hives and Itching   Shrimp (Diagnostic)    Sulfa Antibiotics Other (See Comments)    Headaches  Headaches       Prior to Admission medications   Medication Sig Start Date End Date Taking? Authorizing Provider  aspirin EC 81 MG tablet Take 81 mg by mouth daily. Swallow whole.    [provider]  doxylamine, Sleep, (UNISOM) 25 MG tablet Take 25 mg by mouth at bedtime as needed for sleep.    [provider]  Pediatric Multivit-Minerals (FLINTSTONES GUMMIES PO) Take 2 tablets by mouth once.    [provider]  venlafaxine (EFFEXOR) 37.5 MG tablet Take 37.5 mg by mouth once.    [provider]    Social History: She  reports that she has never smoked. She has never used smokeless tobacco. She reports that she does not currently use alcohol. She reports that she does not use drugs.  Family History: family history includes Cancer in her maternal grandfather; Healthy in her father; Lupus in her mother; Rheum arthritis in her  mother. ,no history of gyn cancers  Review of Systems: A full review of systems was performed and negative except as noted in the HPI.    O:  BP 133/71   Pulse (!) 106   Temp 99.2 F (37.3 C) (Oral)   Resp 19   Ht 5\' 6"  (1.676 m)   Wt 101.6 kg   LMP 02/17/2023   BMI 36.15 kg/m  No results found for this or any previous visit (from the past 48 hour(s)).   Constitutional: NAD, AAOx3  HE/ENT: extraocular movements grossly intact, moist mucous membranes CV: RRR PULM: nl respiratory effort, CTABL Abd: gravid, non-tender, non-distended, soft  Ext: Non-tender, Nonedmeatous Psych: mood appropriate, speech normal Pelvic : deferred SVE:     NST: Baseline FHR: 150 beats/min Variability: moderate Accelerations: present Decelerations: absent Tocometry: flat Time: at least 20 minutes   Interpretation: Category I INDICATIONS: decreased fetal movement RESULTS:  A NST procedure was performed with FHR monitoring and a normal baseline established, appropriate time of 20-40 minutes of evaluation, and accels >2 seen w 15x15 characteristics.  Results show a REACTIVE NST.    Assessment: 27 y.o. [redacted]w[redacted]d here for antenatal surveillance during pregnancy.  Principle diagnosis: rash and decreased FM   Plan: Labor: not present.  Fetal Wellbeing: Reassuring Cat 1 tracing. Reactive NST  Continue prednisone, Benadryl and Claritin as prescribed Topical hydrocortisone cream BID Start oral iron supplements daily D/c home stable, precautions reviewed, follow-up as scheduled.   ----- Chari Manning, CNM Certified Nurse Midwife [place]

## 2023-09-19 NOTE — OB Triage Note (Signed)
Pt arrives with c/o decreased fetal movement and rash. Pt states that she has had a rash for greater than a week and has completed a course of prednisone for this already.

## 2023-09-22 ENCOUNTER — Encounter: Payer: Self-pay | Admitting: Obstetrics and Gynecology

## 2023-09-22 ENCOUNTER — Observation Stay
Admission: EM | Admit: 2023-09-22 | Discharge: 2023-09-23 | Disposition: A | Discharge status: Home / Self Care | Payer: BC Managed Care – PPO | Attending: Obstetrics and Gynecology | Admitting: Obstetrics and Gynecology

## 2023-09-22 ENCOUNTER — Other Ambulatory Visit: Payer: Self-pay

## 2023-09-22 ENCOUNTER — Observation Stay: Payer: BC Managed Care – PPO

## 2023-09-22 DIAGNOSIS — O358XX0 Maternal care for other (suspected) fetal abnormality and damage, not applicable or unspecified: Secondary | ICD-10-CM | POA: Diagnosis not present

## 2023-09-22 DIAGNOSIS — O468X3 Other antepartum hemorrhage, third trimester: Secondary | ICD-10-CM | POA: Diagnosis not present

## 2023-09-22 DIAGNOSIS — O99210 Obesity complicating pregnancy, unspecified trimester: Secondary | ICD-10-CM | POA: Diagnosis present

## 2023-09-22 DIAGNOSIS — O26853 Spotting complicating pregnancy, third trimester: Principal | ICD-10-CM | POA: Insufficient documentation

## 2023-09-22 DIAGNOSIS — O99213 Obesity complicating pregnancy, third trimester: Secondary | ICD-10-CM | POA: Insufficient documentation

## 2023-09-22 DIAGNOSIS — R7309 Other abnormal glucose: Secondary | ICD-10-CM | POA: Diagnosis not present

## 2023-09-22 DIAGNOSIS — E669 Obesity, unspecified: Secondary | ICD-10-CM | POA: Diagnosis not present

## 2023-09-22 DIAGNOSIS — Z6836 Body mass index (BMI) 36.0-36.9, adult: Secondary | ICD-10-CM | POA: Diagnosis not present

## 2023-09-22 DIAGNOSIS — R768 Other specified abnormal immunological findings in serum: Secondary | ICD-10-CM | POA: Diagnosis present

## 2023-09-22 DIAGNOSIS — O99810 Abnormal glucose complicating pregnancy: Secondary | ICD-10-CM | POA: Insufficient documentation

## 2023-09-22 DIAGNOSIS — O35EXX0 Maternal care for other (suspected) fetal abnormality and damage, fetal genitourinary anomalies, not applicable or unspecified: Secondary | ICD-10-CM | POA: Diagnosis present

## 2023-09-22 DIAGNOSIS — O4690 Antepartum hemorrhage, unspecified, unspecified trimester: Secondary | ICD-10-CM | POA: Diagnosis present

## 2023-09-22 DIAGNOSIS — Z3A31 31 weeks gestation of pregnancy: Secondary | ICD-10-CM | POA: Insufficient documentation

## 2023-09-22 DIAGNOSIS — O26859 Spotting complicating pregnancy, unspecified trimester: Principal | ICD-10-CM | POA: Diagnosis present

## 2023-09-22 DIAGNOSIS — Z7982 Long term (current) use of aspirin: Secondary | ICD-10-CM | POA: Insufficient documentation

## 2023-09-22 DIAGNOSIS — Z79899 Other long term (current) drug therapy: Secondary | ICD-10-CM | POA: Diagnosis not present

## 2023-09-22 LAB — CBC
HCT: 30.9 % — ABNORMAL LOW (ref 36.0–46.0)
Hemoglobin: 10.1 g/dL — ABNORMAL LOW (ref 12.0–15.0)
MCH: 30 pg (ref 26.0–34.0)
MCHC: 32.7 g/dL (ref 30.0–36.0)
MCV: 91.7 fL (ref 80.0–100.0)
Platelets: 286 10*3/uL (ref 150–400)
RBC: 3.37 MIL/uL — ABNORMAL LOW (ref 3.87–5.11)
RDW: 13.2 % (ref 11.5–15.5)
WBC: 12 10*3/uL — ABNORMAL HIGH (ref 4.0–10.5)
nRBC: 0 % (ref 0.0–0.2)

## 2023-09-22 LAB — WET PREP, GENITAL
Clue Cells Wet Prep HPF POC: NONE SEEN
Sperm: NONE SEEN
Trich, Wet Prep: NONE SEEN
WBC, Wet Prep HPF POC: >=10 — AB (ref <10)
Yeast Wet Prep HPF POC: NONE SEEN

## 2023-09-22 LAB — URINALYSIS, COMPLETE (UACMP) WITH MICROSCOPIC
Bilirubin Urine: NEGATIVE
Glucose, UA: NEGATIVE mg/dL
Ketones, ur: NEGATIVE mg/dL
Nitrite: NEGATIVE
Protein, ur: NEGATIVE mg/dL
Specific Gravity, Urine: 1.018 (ref 1.005–1.030)
pH: 6 (ref 5.0–8.0)

## 2023-09-22 LAB — CHLAMYDIA/NGC RT PCR (ARMC ONLY)
Chlamydia Tr: NOT DETECTED
N gonorrhoeae: NOT DETECTED

## 2023-09-22 LAB — TYPE AND SCREEN
ABO/RH(D): A POS
Antibody Screen: NEGATIVE

## 2023-09-22 LAB — GROUP B STREP BY PCR: Group B strep by PCR: NEGATIVE

## 2023-09-22 MED ORDER — SODIUM CHLORIDE 0.9 % IV SOLN: 250.0000 mL | INTRAVENOUS | Status: DC | PRN

## 2023-09-22 MED ORDER — ACETAMINOPHEN 325 MG PO TABS: 650.0000 mg | ORAL_TABLET | ORAL | Status: DC | PRN

## 2023-09-22 MED ORDER — SODIUM CHLORIDE 0.9% FLUSH: 3.0000 mL | INTRAVENOUS | Status: DC | PRN

## 2023-09-22 MED ORDER — SODIUM CHLORIDE 0.9% FLUSH
3.0000 mL | Freq: Two times a day (BID) | INTRAVENOUS | Status: DC
  Administered 2023-09-22: 3 mL via INTRAVENOUS

## 2023-09-22 MED ORDER — ACETAMINOPHEN 500 MG PO TABS: 1000.0000 mg | ORAL_TABLET | Freq: Four times a day (QID) | ORAL | Status: DC | PRN

## 2023-09-22 MED ORDER — CALCIUM CARBONATE ANTACID 500 MG PO CHEW: 2.0000 | CHEWABLE_TABLET | ORAL | Status: DC | PRN

## 2023-09-22 MED ORDER — ASPIRIN 81 MG PO TBEC
81.0000 mg | DELAYED_RELEASE_TABLET | Freq: Every day | ORAL | Status: DC
  Filled 2023-09-22: qty 1

## 2023-09-22 MED ORDER — PRENATAL MULTIVITAMIN CH: 1.0000 | ORAL_TABLET | Freq: Every day | ORAL | Status: DC

## 2023-09-22 MED ORDER — SIMETHICONE 80 MG PO CHEW: 80.0000 mg | CHEWABLE_TABLET | Freq: Four times a day (QID) | ORAL | Status: DC | PRN

## 2023-09-22 MED ORDER — VENLAFAXINE HCL 37.5 MG PO TABS
37.5000 mg | ORAL_TABLET | Freq: Once | ORAL | Status: DC
  Filled 2023-09-22: qty 1

## 2023-09-22 MED ORDER — FERROUS SULFATE 325 (65 FE) MG PO TABS
325.0000 mg | ORAL_TABLET | Freq: Every day | ORAL | Status: DC
  Filled 2023-09-22: qty 1

## 2023-09-22 MED ORDER — POLYETHYLENE GLYCOL 3350 17 G PO PACK: 17.0000 g | PACK | Freq: Every day | ORAL | Status: DC | PRN

## 2023-09-22 NOTE — H&P (Signed)
HISTORY AND PHYSICAL NOTE  History of Present Illness: Rachel Ortega is a 27 y.o. G2P1001 at [redacted]w[redacted]d observed for spotting in pregnancy.  She presented to L&D with complaints of spotting that started today around 10am.  She had new onset left sided abdominal cramping and lower period-like cramping.  She also reported increased vaginal discharge.  Spotting was light and mostly when she wiped.  She denies recent intercourse. Last bowel movement was today.  States that cramping gets better with rest.  Endorses good fetal movement.  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 She is currently followed by [facility] for growth ultrasounds and collaborative care of her pregnancy.   Reports active fetal movement  Contractions: irregular cramping  LOF/SROM: denies  Vaginal bleeding: light spotting since 1000 Fetal presentation is cephalic.  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, antepartum   Elevated glucose tolerance test   Prenatal care site:  [facility]  Patient Active Problem List   Diagnosis Date Noted   Spotting affecting pregnancy 09/22/2023   Vaginal bleeding during pregnancy 09/22/2023   SS-A antibody positive 09/22/2023   Obesity affecting pregnancy 09/22/2023   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, antepartum 09/22/2023   Elevated glucose tolerance test 09/22/2023   Rash and nonspecific skin eruption 09/19/2023   Anxiety and depression 07/14/2023   History of pre-eclampsia in prior pregnancy, currently pregnant 07/14/2023   Positive ANA (antinuclear antibody)  07/14/2023   Supervision of high risk pregnancy in second trimester 04/22/2023   Thrombocytopenia due to blood loss 02/07/2018   family H/O inherited metabolic disease 28/41/3244    Past Medical History:  Diagnosis Date   Depression    Medical history non-contributory    Preeclampsia, third trimester 02/05/2018    Past Surgical History:  Procedure Laterality Date   TONSILLECTOMY Bilateral    TONSILLECTOMY     WISDOM TOOTH EXTRACTION Bilateral     OB History  Gravida Para Term Preterm AB Living  2 1 1     1   SAB IAB Ectopic Multiple Live Births          1    # Outcome Date GA Lbr Len/2nd Weight Sex Type Anes PTL Lv  2 Current           1 Term 02/06/18 [redacted]w[redacted]d   F Vag-Spont EPI  LIV     Complications: Pre-eclampsia    Social History:  reports that she has never smoked. She has never used smokeless tobacco. She reports that she does not currently use alcohol. She reports that she does not use drugs.  Family History: family history includes Cancer in her maternal grandfather; Healthy in her father; Lupus in her mother; Rheum arthritis in her mother.  Allergies  Allergen Reactions   Codeine Hives and Itching   Shrimp (Diagnostic)    Sulfa Antibiotics Other (See Comments)    Headaches  Headaches      Medications Prior to Admission  Medication Sig Dispense Refill Last Dose   aspirin EC 81 MG tablet Take 81 mg by mouth daily. Swallow whole.   09/21/2023   doxylamine, Sleep, (UNISOM) 25 MG tablet Take 25  mg by mouth at bedtime as needed for sleep.   09/21/2023   ferrous sulfate 325 (65 FE) MG tablet Take 1 tablet (325 mg total) by mouth daily with breakfast. Take with Vitamin C   09/22/2023   Pediatric Multivit-Minerals (FLINTSTONES GUMMIES PO) Take 2 tablets by mouth once.   Past Week   venlafaxine (EFFEXOR) 37.5 MG tablet Take 37.5 mg by mouth once.   09/22/2023   hydrocortisone cream 1 % Apply topically 2 (two) times daily. (Patient not taking: Reported on 09/22/2023) 30 g 0  Not Taking    Review of Systems  Constitutional:  Negative for chills and fever.  Respiratory:  Negative for cough and shortness of breath.   Cardiovascular:  Negative for chest pain.  Gastrointestinal:  Positive for abdominal pain (lower abdominal cramping). Negative for heartburn, nausea and vomiting.  Genitourinary:  Negative for dysuria, frequency and urgency.  Musculoskeletal:  Positive for back pain.  Neurological:  Negative for dizziness and headaches.  Psychiatric/Behavioral:  Negative for depression. The patient is not nervous/anxious.     Physical Examination: Vitals:  BP (!) 118/59   Pulse (!) 106   Temp 98.6 F (37 C) (Oral)   Resp 16   Ht 5\' 6"  (1.676 m)   Wt 102.5 kg   LMP 02/17/2023   BMI 36.48 kg/m  General: no acute distress.  HEENT: normocephalic, atraumatic Lungs: normal respiratory effort Abdomen: soft, gravid, non-tender;   Pelvic:   External: Normal external female genitalia  Vagina: moderate amount of thick, white discharge present along with small amounts of light pink discharge c/w bleeding  Cervix: visually closed/long, scant amount of light pink blood noted around cervical OS    Extremities: non-tender, symmetric, no edema bilaterally.  DTRs: 2+/2+  Neurologic: Alert & oriented x 3.    Membranes: intact FHT:  FHR: 135 bpm, variability: moderate,  accelerations:  Present,  decelerations:  Absent Category/reactivity:  Category I UC:   occasional contractions with intermittent irritability   Labs:  Results for orders placed or performed during the hospital encounter of 09/22/23 (from the past 24 hour(s))  Wet prep, genital   Collection Time: 09/22/23  2:12 PM   Specimen: Urine, Clean Catch  Result Value Ref Range   Yeast Wet Prep HPF POC NONE SEEN NONE SEEN   Trich, Wet Prep NONE SEEN NONE SEEN   Clue Cells Wet Prep HPF POC NONE SEEN NONE SEEN   WBC, Wet Prep HPF POC >=10 (A) <10   Sperm NONE SEEN   Urinalysis, Complete w Microscopic  -Urine, Clean Catch   Collection Time: 09/22/23  2:12 PM  Result Value Ref Range   Color, Urine YELLOW (A) YELLOW   APPearance HAZY (A) CLEAR   Specific Gravity, Urine 1.018 1.005 - 1.030   pH 6.0 5.0 - 8.0   Glucose, UA NEGATIVE NEGATIVE mg/dL   Hgb urine dipstick MODERATE (A) NEGATIVE   Bilirubin Urine NEGATIVE NEGATIVE   Ketones, ur NEGATIVE NEGATIVE mg/dL   Protein, ur NEGATIVE NEGATIVE mg/dL   Nitrite NEGATIVE NEGATIVE   Leukocytes,Ua LARGE (A) NEGATIVE   RBC / HPF 0-5 0 - 5 RBC/hpf   WBC, UA 21-50 0 - 5 WBC/hpf   Bacteria, UA RARE (A) NONE SEEN   Squamous Epithelial / HPF 0-5 0 - 5 /HPF   Mucus PRESENT     Prenatal Labs: Blood type/Rh A pos   Antibody screen neg  Rubella Immune  Varicella Immune  RPR NR  HBsAg Neg  Hep C  NR  HIV NR  GC neg  Chlamydia neg  Genetic screening cfDNA negative   1 hour GTT 161  3 hour GTT Scheduled   GBS Unknown     Imaging Studies: [place] MFM OB FOLLOW UP  Result Date: 09/01/2023 ----------------------------------------------------------------------  OBSTETRICS REPORT                       (Signed Final 09/01/2023 09:53 am) ---------------------------------------------------------------------- Patient Info  ID #:       [id number]                          D.O.B.:  [birth year] (27 yrs)  Name:       Rachel Ortega                   Visit Date: 09/01/2023 08:25 am ---------------------------------------------------------------------- Performed By  Attending:        Noralee Space MD        Ref. Address:     [address]                                                             [place], [place],                                                             [place] [address]  Performed By:     Eden Lathe BS      [facility]                                 [facility] at                                                             [facility]  Referred By:      Suzy Bouchard                     MD ---------------------------------------------------------------------- Orders  #  Description                           Code        Ordered By  1  [place] MFM OB FOLLOW UP                   [id number]    Bettey Costa  Grace Bushy ----------------------------------------------------------------------  #  Order #                     Accession #                Episode #  1  [id number]                   [id number]                 [id number] ---------------------------------------------------------------------- Indications  Pregnancy complicated by fetal multicystic     O35.8XX0  dysplastic kidney  Medication exposure during first trimester of  O09.891  pregnancy  Poor obstetric history: Previous preeclampsia  O09.299  Obesity complicating pregnancy, second         O99.212  trimester  Low risk NIPS  [redacted] weeks gestation of pregnancy                Z3A.28  Encounter for other antenatal screening        Z36.2  follow-up ---------------------------------------------------------------------- Fetal Evaluation  Num Of Fetuses:         1  Fetal Heart Rate(bpm):  144  Cardiac Activity:       Observed  Presentation:           Cephalic  Placenta:               Posterior  P. Cord Insertion:      Not well visualized  Amniotic Fluid  AFI FV:      Within normal limits  AFI Sum(cm)     %Tile       Largest Pocket(cm)  10.55           15          3.85  RUQ(cm)       RLQ(cm)       LUQ(cm)        LLQ(cm)  3.85          0.85          3.33           2.52 ---------------------------------------------------------------------- Biometry  BPD:     70.64  mm     G. Age:  28w 3d         50  %    CI:        70.26   %    70 - 86                                                          FL/HC:      19.8   %    18.8 - 20.6  HC:    268.75   mm     G. Age:  29w 2d         61  %    HC/AC:      1.12        1.05 - 1.21  AC:      240.3  mm     G. Age:  28w 2d         52  %    FL/BPD:     75.2   %    71 - 87   FL:      53.13  mm  G. Age:  28w 2d         41  %    FL/AC:      22.1   %    20 - 24  LV:          2  mm  Est. FW:    1218  gm    2 lb 11 oz      52  % ---------------------------------------------------------------------- OB History  Gravidity:    2         Term:   1        Prem:   0        SAB:   0  TOP:          0       Ectopic:  0        Living: 1 ---------------------------------------------------------------------- Gestational Age  LMP:           28w 0d        Date:  02/17/23                  EDD:   11/24/23  U/S Today:     28w 4d                                        EDD:   11/20/23  Best:          28w 0d     Det. By:  LMP  (02/17/23)          EDD:   11/24/23 ---------------------------------------------------------------------- Anatomy  Cranium:               Appears normal         LVOT:                   Appears normal  Cavum:                 Appears normal         Aortic Arch:            Previously seen  Ventricles:            Appears normal         Ductal Arch:            Previously seen  Choroid Plexus:        Previously seen        Diaphragm:              Previously seen  Cerebellum:            Previously seen        Stomach:                Appears normal, left                                                                        sided  Posterior Fossa:       Previously seen        Abdomen:                Appears normal  Nuchal Fold:  Previously seen        Abdominal Wall:         Previously seen  Face:                  Appears normal         Cord Vessels:           Previously seen                         (orbits and profile)  Lips:                  Appears normal         Kidneys:                LEFT multicystic                                                                        dysplastic  Palate:                Previously seen        Bladder:                Appears normal  Thoracic:              Appears normal         Spine:                  Limited views                                                                         previously seen  Heart:                 Appears normal         Upper Extremities:      Previously seen                         (4CH, axis, and                         situs)  RVOT:                  Previously seen        Lower Extremities:      Previously seen  Other:  Heels visualized. VC, 3VV, 3VTV, open hands, nasal bone, lenses,          maxilla, mandible, and female gender previously visualized. Technically          difficult due to maternal habitus and fetal position. ---------------------------------------------------------------------- Cervix Uterus Adnexa  Cervix  Not visualized (advanced GA >24wks)  Uterus  No abnormality visualized.  Right Ovary  Not visualized.  Left Ovary  Not visualized.  Cul De Sac  No free fluid seen. ---------------------------------------------------------------------- Impression  G2 P1001.  Patient returned for fetal growth assessment.  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  On cell-free fetal DNA screening, the risks of fetal  aneuploidies are not increased.  Patient will be undergoing  screening for gestational diabetes this week.  Amniotic fluid is normal and good fetal activity seen.  Fetal  growth is appropriate for gestational age.  Fetal bladder is  seen well. Female fetus.  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  Multiple  noncommunicating cysts are seen.  The kidney appears  enlarged and reniform shape is lost.  Contralateral right  kidney appears normal.  I counseled the couple on the diagnosis of multicystic  dysplastic kidney.  In most cases, the involute after delivery  and no surgery is required. In the absence of other nonrenal  anomalies, the likelihood of genetic syndromes is very low.  Patient opted not have amniocentesis. ---------------------------------------------------------------------- Recommendations  -An appointment was made for her to return in 4 weeks for  fetal growth and  evaluation of the kidneys. ----------------------------------------------------------------------                 Noralee Space, MD Electronically Signed Final Report   09/01/2023 09:53 am ----------------------------------------------------------------------    Assessment and Plan: Rachel Ortega is a G2P1001, [redacted]w[redacted]d IUP, with vaginal bleeding in third trimester.     1. Observation for vaginal bleeding in pregnancy  -Admission status: Observation -Dr. Lavina Hamman MD reviewed plan of care   -Recommend observation overnight to further monitor for changes or worsening of vaginal bleeding   2. Vaginal bleeding in pregnancy  -small amount of vaginal bleeding noted during speculum exam  -Cervical length long  -[place] overall reassuring  -Wet prep negative for vaginal infections  -Urine culture ordered  -Pad count  -NST in AM - continuous EFM if bleeding increases or reporting more cramping   2. Routine antenatal -Routine antenatal care -Reg diet  -Activity as tolerated, bathroom privileges    Gustavo Lah, CNM  Certified Nurse Midwife [facility]

## 2023-09-22 NOTE — OB Triage Note (Addendum)
Pt presents to L&D complaining of left sided abdominal cramping and spotting starting today. Reports spotting with wiping. Increased vaginal discharge reported. Reports she has not had vaginal discharge this pregnancy but it started today. Monitors applied and assessing. Last BM today. No recent intercourse reported. BP 134/77. Other VSS.  Zailee Vallely B. Henley Blyth, RN BSN 09/22/2023 1:39 PM

## 2023-09-23 ENCOUNTER — Observation Stay: Payer: BC Managed Care – PPO

## 2023-09-23 DIAGNOSIS — O26853 Spotting complicating pregnancy, third trimester: Secondary | ICD-10-CM | POA: Diagnosis not present

## 2023-09-23 LAB — RUPTURE OF MEMBRANE (ROM)PLUS: Rom Plus: NEGATIVE

## 2023-09-23 LAB — RPR: RPR Ser Ql: NONREACTIVE

## 2023-09-23 LAB — HIV ANTIBODY (ROUTINE TESTING W REFLEX): HIV Screen 4th Generation wRfx: NONREACTIVE

## 2023-09-23 NOTE — Discharge Summary (Signed)
Patient ID: TACORA FICCO MRN: [id number] DOB/AGE: [birth year] 28 y.o.  Admit date: 09/22/2023 Discharge date: 09/23/2023  Admission Diagnoses: 27yo G2P1 at [redacted]w[redacted]d presents with spotting and cramping.  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, antepartum   Elevated glucose tolerance test  Prenatal Procedures: NST and ultrasound  Consults: None  Significant Diagnostic Studies:  Results for orders placed or performed during the hospital encounter of 09/22/23 (from the past 168 hour(s))  Wet prep, genital   Collection Time: 09/22/23  2:12 PM   Specimen: Urine, Clean Catch  Result Value Ref Range   Yeast Wet Prep HPF POC NONE SEEN NONE SEEN   Trich, Wet Prep NONE SEEN NONE SEEN   Clue Cells Wet Prep HPF POC NONE SEEN NONE SEEN   WBC, Wet Prep HPF POC >=10 (A) <10   Sperm NONE SEEN   Urinalysis, Complete w Microscopic -Urine, Clean Catch   Collection Time: 09/22/23  2:12 PM  Result Value Ref Range   Color, Urine YELLOW (A) YELLOW   APPearance HAZY (A) CLEAR   Specific Gravity, Urine 1.018 1.005 - 1.030   pH 6.0 5.0 - 8.0   Glucose, UA NEGATIVE NEGATIVE mg/dL   Hgb urine dipstick MODERATE (A) NEGATIVE   Bilirubin Urine NEGATIVE NEGATIVE   Ketones, ur NEGATIVE NEGATIVE mg/dL   Protein, ur NEGATIVE NEGATIVE mg/dL   Nitrite NEGATIVE NEGATIVE   Leukocytes,Ua LARGE (A) NEGATIVE   RBC / HPF 0-5 0 - 5 RBC/hpf   WBC, UA 21-50 0 - 5 WBC/hpf   Bacteria, UA RARE (A) NONE SEEN   Squamous Epithelial / HPF 0-5 0 - 5 /HPF   Mucus PRESENT   CBC   Collection Time: 09/22/23  6:22 PM  Result Value Ref Range   WBC 12.0 (H) 4.0 - 10.5 K/uL   RBC 3.37 (L) 3.87 - 5.11 MIL/uL   Hemoglobin 10.1 (L) 12.0 - 15.0 g/dL   HCT 69.6 (L) 29.5 - 28.4  %   MCV 91.7 80.0 - 100.0 fL   MCH 30.0 26.0 - 34.0 pg   MCHC 32.7 30.0 - 36.0 g/dL   RDW 13.2 44.0 - 10.2 %   Platelets 286 150 - 400 K/uL   nRBC 0.0 0.0 - 0.2 %  RPR   Collection Time: 09/22/23  6:22 PM  Result Value Ref Range   RPR Ser Ql NON REACTIVE NON REACTIVE  HIV Antibody (routine testing w rflx)   Collection Time: 09/22/23  6:22 PM  Result Value Ref Range   HIV Screen 4th Generation wRfx Non Reactive Non Reactive  Type and screen [facility]   Collection Time: 09/22/23  6:22 PM  Result Value Ref Range   ABO/RH(D) A POS    Antibody Screen NEG    Sample Expiration      09/25/2023,2359 Performed at [facility], [address] [place], [place] [address]   Group B strep by PCR   Collection Time: 09/22/23  7:59 PM   Specimen: Vaginal/Rectal; Genital  Result Value Ref Range   Group B strep by PCR NEGATIVE NEGATIVE  Chlamydia/NGC rt PCR ([facility] only)   Collection Time: 09/22/23  8:06 PM   Specimen: Vaginal/Rectal; Genital  Result Value Ref Range   Specimen source GC/Chlam ENDOCERVICAL  Chlamydia Tr NOT DETECTED NOT DETECTED   N gonorrhoeae NOT DETECTED NOT DETECTED  Rupture of Membrane (ROM) Plus   Collection Time: 09/23/23  2:26 PM  Result Value Ref Range   Rom Plus NEGATIVE   Results for orders placed or performed during the hospital encounter of 09/19/23 (from the past 168 hour(s))  Comprehensive metabolic panel   Collection Time: 09/19/23  9:57 PM  Result Value Ref Range   Sodium 135 135 - 145 mmol/L   Potassium 3.7 3.5 - 5.1 mmol/L   Chloride 104 98 - 111 mmol/L   CO2 21 (L) 22 - 32 mmol/L   Glucose, Bld 190 (H) 70 - 99 mg/dL   BUN 8 6 - 20 mg/dL   Creatinine, Ser 1.61 0.44 - 1.00 mg/dL   Calcium 8.5 (L) 8.9 - 10.3 mg/dL   Total Protein 6.5 6.5 - 8.1 g/dL   Albumin 2.6 (L) 3.5 - 5.0 g/dL   AST 12 (L) 15 - 41 U/L   ALT 10 0 - 44 U/L   Alkaline Phosphatase 64 38 - 126 U/L   Total Bilirubin 0.6 0.3 - 1.2 mg/dL   GFR,  Estimated >09 >60 mL/min   Anion gap 10 5 - 15  CBC   Collection Time: 09/19/23  9:57 PM  Result Value Ref Range   WBC 12.6 (H) 4.0 - 10.5 K/uL   RBC 3.37 (L) 3.87 - 5.11 MIL/uL   Hemoglobin 10.3 (L) 12.0 - 15.0 g/dL   HCT 45.4 (L) 09.8 - 11.9 %   MCV 92.0 80.0 - 100.0 fL   MCH 30.6 26.0 - 34.0 pg   MCHC 33.2 30.0 - 36.0 g/dL   RDW 14.7 82.9 - 56.2 %   Platelets 257 150 - 400 K/uL   nRBC 0.0 0.0 - 0.2 %    Treatments: none  Hospital Course:  This is a 27 y.o. G2P1001 with IUP at [redacted]w[redacted]d admitted for overnight observation after complaints of cramping and spotting, labs were collected and u/s completed.  No leaking of fluid and no bleeding.  AFI on 9/30 was 5.9, repeated on 10/1 AFI was 8.9.  She was observed, NSTs were reactive, and she had no signs/symptoms of preterm labor or other maternal-fetal concerns.  She was deemed stable for discharge to home with outpatient follow up.  Discharge Physical Exam:  BP 114/73 (BP Location: Right Arm)   Pulse (!) 114   Temp 98.5 F (36.9 C) (Oral)   Resp 20   Ht 5\' 6"  (1.676 m)   Wt 102.5 kg   LMP 02/17/2023   SpO2 100%   BMI 36.48 kg/m   General: NAD CV: RRR Pulm: CTABL, nl effort ABD: s/nd/nt, gravid DVT Evaluation: LE non-ttp, no evidence of DVT on exam. Cervix appeared closed on speculum exam  NST: FHR baseline: 150 bpm Variability: moderate Accelerations: yes Decelerations: none Time: 20 minutes Category/reactivity: reactive Amniotic Fluid Index: 8.9 cm  TOCO: quiet SVE: deferred      Discharge Condition: Stable  Disposition: Discharge disposition: 01-Home or Self Care        Allergies as of 09/23/2023       Reactions   Codeine Hives, Itching   Shrimp (diagnostic)    Sulfa Antibiotics Other (See Comments)   Headaches  Headaches         Medication List     TAKE these medications    aspirin EC 81 MG tablet Take 81 mg by mouth daily. Swallow whole.   doxylamine (Sleep)  25 MG tablet Commonly  known as: UNISOM Take 25 mg by mouth at bedtime as needed for sleep.   ferrous sulfate 325 (65 FE) MG tablet Take 1 tablet (325 mg total) by mouth daily with breakfast. Take with Vitamin C   FLINTSTONES GUMMIES PO Take 2 tablets by mouth once.   hydrocortisone cream 1 % Apply topically 2 (two) times daily.   venlafaxine 37.5 MG tablet Commonly known as: EFFEXOR Take 37.5 mg by mouth once.        Follow-up Information     [facility] Follow up on 10/02/2023.   Why: Keep all scheduled appointments Contact information: [address] [place] [address] [phone]                Signed:  Haroldine Laws, CNM 09/23/2023 6:18 PM

## 2023-09-23 NOTE — Progress Notes (Signed)
ANTEPARTUM PROGRESS NOTE  Rachel Ortega is a 27 y.o. G2P1001 at [redacted]w[redacted]d who is admitted for cramping and spotting at 31 weeks    Estimated Date of Delivery: 11/24/23  Length of Stay:  0 Days. Admitted 09/22/2023  Subjective: This morning patient denies cramping and reports her last time she noticed spotting was last night around 1800.   Vitals:  BP 126/68 (BP Location: Left Arm)   Pulse (!) 116   Temp 98.6 F (37 C) (Oral)   Resp 20   Ht 5\' 6"  (1.676 m)   Wt 102.5 kg   LMP 02/17/2023   SpO2 99%   BMI 36.48 kg/m  Physical Examination: General:   alert and cooperative  Skin:  normal  Neurologic:    Alert & oriented x 3  Lungs:    Nl effort  Heart:   regular rate and rhythm  Abdomen:  soft, non-tender; bowel sounds normal; no masses,  no organomegaly  Extremities: : non-tender, symmetric, no edema bilaterally.       Results for orders placed or performed during the hospital encounter of 09/22/23 (from the past 48 hour(s))  Urinalysis, Complete w Microscopic -Urine, Clean Catch     Status: Abnormal   Collection Time: 09/22/23  2:12 PM  Result Value Ref Range   Color, Urine YELLOW (A) YELLOW   APPearance HAZY (A) CLEAR   Specific Gravity, Urine 1.018 1.005 - 1.030   pH 6.0 5.0 - 8.0   Glucose, UA NEGATIVE NEGATIVE mg/dL   Hgb urine dipstick MODERATE (A) NEGATIVE   Bilirubin Urine NEGATIVE NEGATIVE   Ketones, ur NEGATIVE NEGATIVE mg/dL   Protein, ur NEGATIVE NEGATIVE mg/dL   Nitrite NEGATIVE NEGATIVE   Leukocytes,Ua LARGE (A) NEGATIVE   RBC / HPF 0-5 0 - 5 RBC/hpf   WBC, UA 21-50 0 - 5 WBC/hpf   Bacteria, UA RARE (A) NONE SEEN   Squamous Epithelial / HPF 0-5 0 - 5 /HPF   Mucus PRESENT     Comment: Performed at [facility], [address] [place], [place] [address]  Wet prep, genital     Status: Abnormal   Collection Time: 09/22/23  2:12 PM   Specimen: Urine, Clean Catch  Result Value Ref Range   Yeast Wet Prep HPF POC NONE SEEN NONE SEEN   Trich, Wet Prep  NONE SEEN NONE SEEN   Clue Cells Wet Prep HPF POC NONE SEEN NONE SEEN   WBC, Wet Prep HPF POC >=10 (A) <10   Sperm NONE SEEN     Comment: Specimen diluted due to transport tube containing more than 1 ml of saline, interpret results with caution. Performed at [facility], [address] [place], [place] [address]   CBC     Status: Abnormal   Collection Time: 09/22/23  6:22 PM  Result Value Ref Range   WBC 12.0 (H) 4.0 - 10.5 K/uL   RBC 3.37 (L) 3.87 - 5.11 MIL/uL   Hemoglobin 10.1 (L) 12.0 - 15.0 g/dL   HCT 56.4 (L) 33.2 - 95.1 %   MCV 91.7 80.0 - 100.0 fL   MCH 30.0 26.0 - 34.0 pg   MCHC 32.7 30.0 - 36.0 g/dL   RDW 88.4 16.6 - 06.3 %   Platelets 286 150 - 400 K/uL   nRBC 0.0 0.0 - 0.2 %    Comment: Performed at [facility], [address] [place], [place] [address]  Type and screen [facility]  Status: None   Collection Time: 09/22/23  6:22 PM  Result Value Ref Range   ABO/RH(D) A POS    Antibody Screen NEG    Sample Expiration      09/25/2023,2359 Performed at [facility], [address] [place], [place] [address]   RPR     Status: None   Collection Time: 09/22/23  6:22 PM  Result Value Ref Range   RPR Ser Ql NON REACTIVE NON REACTIVE    Comment: Performed at [facility], [address], [place], [place] [address]  Group B strep by PCR     Status: None   Collection Time: 09/22/23  7:59 PM   Specimen: Vaginal/Rectal; Genital  Result Value Ref Range   Group B strep by PCR NEGATIVE NEGATIVE    Comment: (NOTE) Intrapartum testing with Xpert GBS assay should be used as an adjunct to other methods available and not used to replace antepartum testing (at 35-[redacted] weeks gestation). Performed at [facility], [address] [place], [place] [address]   Chlamydia/NGC rt PCR [facility] only)     Status: None   Collection Time: 09/22/23  8:06 PM   Specimen: Vaginal/Rectal; Genital  Result Value Ref Range    Specimen source GC/Chlam ENDOCERVICAL    Chlamydia Tr NOT DETECTED NOT DETECTED   N gonorrhoeae NOT DETECTED NOT DETECTED    Comment: (NOTE) This CT/NG assay has not been evaluated in patients with a history of  hysterectomy. Performed at [facility], [address] [place], [place] [address]     [place] [employer]  Result Date: 09/22/2023 CLINICAL DATA:  Pregnancy and spotting. EXAM: LIMITED OBSTETRIC ULTRASOUND COMPARISON:  None Available. FINDINGS: Number of Fetuses: 1 Heart Rate:  140 bpm Movement: Detected Presentation: Cephalic Placental Location: Posterior Previa: No Amniotic Fluid (Subjective):  Appears decreased. AFI: 5.9 cm BPD: 7.9 cm 31 w  4 d MATERNAL FINDINGS: Cervix:  Appears closed.  The cervical length is 3 cm. Uterus/Adnexae: No abnormality visualized. IMPRESSION: 1. Single live intrauterine pregnancy. 2. Decreased amniotic fluid. This exam is performed on an emergent basis and does not comprehensively evaluate fetal size, dating, or anatomy; follow-up complete OB US should be considered if further fetal assessment is warranted. Electronically Signed   By: Elgie Collard M.D.   On: 09/22/2023 20:16    Current scheduled medications  aspirin EC  81 mg Oral Daily   ferrous sulfate  325 mg Oral Q breakfast   prenatal multivitamin  1 tablet Oral Q1200   sodium chloride flush  3 mL Intravenous Q12H   venlafaxine  37.5 mg Oral Once    I have reviewed the patient's current medications.  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 Observation for vaginal bleeding in pregnancy  -Admission status: Observation   2. Vaginal bleeding in pregnancy  -  Will repeat speculum exam once she is in L&D for a repeat NST -Cervical length long  -[place] overall reassuring  -Wet prep negative for vaginal infections  -Urine culture pending     2. Routine antenatal -Routine antenatal care -Reg diet  -Activity as tolerated, bathroom privileges   Cyril Mourning, CNM 09/23/2023 9:28 AM  Haroldine Laws Certified Nurse Midwife [facility]

## 2023-09-23 NOTE — Progress Notes (Signed)
Pt discharged home.  Discharge instructions, prescriptions and follow up appointment given to and reviewed with pt.  Pt verbalized understanding.  Escorted by auxillary. 

## 2023-09-24 ENCOUNTER — Other Ambulatory Visit: Payer: Self-pay | Admitting: Obstetrics and Gynecology

## 2023-09-24 DIAGNOSIS — O359XX0 Maternal care for (suspected) fetal abnormality and damage, unspecified, not applicable or unspecified: Secondary | ICD-10-CM

## 2023-09-24 LAB — URINE CULTURE

## 2023-10-01 ENCOUNTER — Ambulatory Visit: Payer: BC Managed Care – PPO | Attending: Obstetrics

## 2023-10-01 ENCOUNTER — Other Ambulatory Visit: Payer: Self-pay

## 2023-10-01 VITALS — BP 128/83 | HR 110 | Temp 98.0°F | Resp 18 | Ht 66.0 in | Wt 223.0 lb

## 2023-10-01 DIAGNOSIS — O359XX0 Maternal care for (suspected) fetal abnormality and damage, unspecified, not applicable or unspecified: Secondary | ICD-10-CM

## 2023-10-01 DIAGNOSIS — Z3A32 32 weeks gestation of pregnancy: Secondary | ICD-10-CM | POA: Insufficient documentation

## 2023-10-01 DIAGNOSIS — Q614 Renal dysplasia: Secondary | ICD-10-CM | POA: Diagnosis present

## 2023-10-01 DIAGNOSIS — O09293 Supervision of pregnancy with other poor reproductive or obstetric history, third trimester: Secondary | ICD-10-CM | POA: Diagnosis not present

## 2023-10-01 DIAGNOSIS — R7309 Other abnormal glucose: Secondary | ICD-10-CM

## 2023-10-01 DIAGNOSIS — O35EXX1 Maternal care for other (suspected) fetal abnormality and damage, fetal genitourinary anomalies, fetus 1: Secondary | ICD-10-CM

## 2023-10-01 DIAGNOSIS — O99213 Obesity complicating pregnancy, third trimester: Secondary | ICD-10-CM | POA: Insufficient documentation

## 2023-10-01 DIAGNOSIS — Z362 Encounter for other antenatal screening follow-up: Secondary | ICD-10-CM | POA: Diagnosis not present

## 2023-10-01 DIAGNOSIS — O35EXX0 Maternal care for other (suspected) fetal abnormality and damage, fetal genitourinary anomalies, not applicable or unspecified: Secondary | ICD-10-CM | POA: Diagnosis not present

## 2023-10-01 DIAGNOSIS — E669 Obesity, unspecified: Secondary | ICD-10-CM

## 2023-10-01 DIAGNOSIS — Z79899 Other long term (current) drug therapy: Secondary | ICD-10-CM

## 2023-10-01 DIAGNOSIS — R768 Other specified abnormal immunological findings in serum: Secondary | ICD-10-CM

## 2023-10-14 ENCOUNTER — Ambulatory Visit: Payer: BC Managed Care – PPO | Admitting: Dietician

## 2023-10-16 ENCOUNTER — Encounter: Payer: BC Managed Care – PPO | Attending: Certified Nurse Midwife | Admitting: Dietician

## 2023-10-16 ENCOUNTER — Encounter: Payer: Self-pay | Admitting: Dietician

## 2023-10-16 VITALS — Ht 66.0 in | Wt 227.0 lb

## 2023-10-16 DIAGNOSIS — Z3A31 31 weeks gestation of pregnancy: Secondary | ICD-10-CM | POA: Insufficient documentation

## 2023-10-16 DIAGNOSIS — O24410 Gestational diabetes mellitus in pregnancy, diet controlled: Secondary | ICD-10-CM | POA: Diagnosis not present

## 2023-10-16 DIAGNOSIS — Z713 Dietary counseling and surveillance: Secondary | ICD-10-CM | POA: Insufficient documentation

## 2023-10-16 NOTE — Progress Notes (Signed)
Diabetes Self-Management Education  Visit Type: First/Initial  Appt. Start Time: 0815 Appt. End Time: 0930  10/16/2023  Rachel Ortega, identified by name and date of birth, is a 27 y.o. female with a diagnosis of Diabetes: Gestational Diabetes.   ASSESSMENT  Height 5\' 6"  (1.676 m), weight 227 lb (103 kg), last menstrual period 02/17/2023. Body mass index is 36.64 kg/m.   Diabetes Self-Management Education - 10/16/23 0824       Visit Information   Visit Type First/Initial      Initial Visit   Diabetes Type Gestational Diabetes    Are you currently following a meal plan? No    Are you taking your medications as prescribed? Yes      Health Coping   How would you rate your overall health? Good      Psychosocial Assessment   Patient Belief/Attitude about Diabetes Motivated to manage diabetes    Self-care barriers None    Special Needs None    Learning Readiness Ready    How often do you need to have someone help you when you read instructions, pamphlets, or other written materials from your doctor or pharmacy? 1 - Never    What is the last grade level you completed in school? 12      Pre-Education Assessment   Patient understands the diabetes disease and treatment process. Comprehends key points    Patient understands incorporating nutritional management into lifestyle. Needs Review    Patient undertands incorporating physical activity into lifestyle. Needs Review    Patient understands using medications safely. Needs Review    Patient understands monitoring blood glucose, interpreting and using results Comprehends key points    Patient understands prevention, detection, and treatment of acute complications. Comprehends key points    Patient understands prevention, detection, and treatment of chronic complications. N/A (comment)   gestational diabetes   Patient understands how to develop strategies to address psychosocial issues. Comprehends key points    Patient  understands how to develop strategies to promote health/change behavior. Comprehends key points      Complications   How often do you check your blood sugar? 0 times/day (not testing)   brought meter and supplies to visit for instruction   Number of hypoglycemic episodes per month 0      Dietary Intake   Breakfast varies -- yogurt/ bread with butter and jelly    Snack (morning) none    Lunch varies -- 10/23 sandwich    Snack (afternoon) occasionally at work meat stick/ sm bag chips/ fruit    Dinner varies -- 10/23 Japanese    Snack (evening) sometimes cereal    Beverage(s) water, occasional sweet tea or sprite      Activity / Exercise   Activity / Exercise Type Light (walking / raking leaves)   limited due to some bleeding   How many days per week do you exercise? 3    How many minutes per day do you exercise? 15    Total minutes per week of exercise 45      Patient Education   Disease Pathophysiology Definition of diabetes, type 1 and 2, and the diagnosis of diabetes;Explored patient's options for treatment of their diabetes    Healthy Eating Role of diet in the treatment of diabetes and the relationship between the three main macronutrients and blood glucose level;Food label reading, portion sizes and measuring food.;Plate Method;Reviewed blood glucose goals for pre and post meals and how to evaluate the patients' food intake on  their blood glucose level.;Meal timing in regards to the patients' current diabetes medication.;Meal options for control of blood glucose level and chronic complications.    Being Active Helped patient identify appropriate exercises in relation to his/her diabetes, diabetes complications and other health issue.;Role of exercise on diabetes management, blood pressure control and cardiac health.    Monitoring Taught/evaluated SMBG meter.;Purpose and frequency of SMBG.;Taught/discussed recording of test results and interpretation of SMBG.;Identified appropriate SMBG  and/or A1C goals.    Acute complications Taught prevention, symptoms, and  treatment of hypoglycemia - the 15 rule.    Diabetes Stress and Support Role of stress on diabetes    Preconception care Reviewed with patient blood glucose goals with pregnancy      Post-Education Assessment   Patient understands the diabetes disease and treatment process. Demonstrates understanding / competency    Patient understands incorporating nutritional management into lifestyle. Demonstrates understanding / competency    Patient undertands incorporating physical activity into lifestyle. Demonstrates understanding / competency    Patient understands using medications safely. Comphrehends key points    Patient understands monitoring blood glucose, interpreting and using results Demonstrates understanding / competency    Patient understands prevention, detection, and treatment of acute complications. Demonstrates understanding / competency    Patient understands prevention, detection, and treatment of chronic complications. N/A    Patient understands how to develop strategies to address psychosocial issues. Comprehends key points    Patient understands how to develop strategies to promote health/change behavior. Comprehends key points      Outcomes   Expected Outcomes Demonstrated interest in learning. Expect positive outcomes    Future DMSE PRN    Program Status Completed             Individualized Plan for Diabetes Self-Management Training:   Learning Objective:  Patient will have a greater understanding of diabetes self-management. Patient education plan is to attend individual and/or group sessions per assessed needs and concerns.  Other intervention Notes: Patient is familiar with BG testing due to her job; she was able to provide accurate demo of procedure with her meter and supplies. Patient reports following low sugar diet in general; does eat fruits and other starchy foods.  Advised limiting/  avoiding sugar sweetened beverages.  Discussed appropriate portions of carb foods, healthy carb choices, and importance of balancing with high protein foods, low carb vegetables, and small to moderate amounts of healthy fats.      Expected Outcomes:  Demonstrated interest in learning. Expect positive outcomes  Education material provided: Gestational diabetes packet, plate planner with food lists, BG record, copy of gestational diabetes slide presentation  If problems or questions, patient to contact team via:  Phone  Future DSME appointment: PRN

## 2023-10-22 NOTE — Discharge Summary (Signed)
Rachel Ortega is a 27 y.o. female. She is at [redacted]w[redacted]d gestation. Patient's last menstrual period was 02/17/2023. Estimated Date of Delivery: 11/24/23  Prenatal care site: [facility]  Chief complaint: rash and decreased FM  HPI: Rachel Ortega presents to L&D with complaints of rash and decreased FM  Factors complicating pregnancy: Obesity BMI: 35 History of HSV 1 History of abnormal pap smear Positive ANA H/o mental health diagnoses: Depression/Anxiety H/o Preeclampsia, G1  H/o Sulcus Tear in G1 Delivery Covid in 2nd trimester (07/31/23)    S: Resting comfortably. no CTX, no VB.no LOF,  Active fetal movement.   Maternal Medical History:  Past Medical Hx:  has a past medical history of Depression, Medical history non-contributory, and Preeclampsia, third trimester (02/05/2018).    Past Surgical Hx:  has a past surgical history that includes Tonsillectomy (Bilateral); Wisdom tooth extraction (Bilateral); and Tonsillectomy.   Allergies  Allergen Reactions   Codeine Hives and Itching   Shrimp (Diagnostic)    Hydrocod Poli-Chlorphe Poli Er Itching and Other (See Comments)   Sulfa Antibiotics Other (See Comments)    Headaches  Headaches       Prior to Admission medications   Medication Sig Start Date End Date Taking? Authorizing Provider  aspirin EC 81 MG tablet Take 81 mg by mouth daily. Swallow whole.    [provider]  doxylamine, Sleep, (UNISOM) 25 MG tablet Take 25 mg by mouth at bedtime as needed for sleep.    [provider]  Pediatric Multivit-Minerals (FLINTSTONES GUMMIES PO) Take 2 tablets by mouth once.    [provider]  venlafaxine (EFFEXOR) 37.5 MG tablet Take 37.5 mg by mouth once.    [provider]    Social History: She  reports that she has never smoked. She has never used smokeless tobacco. She reports that she does not currently use alcohol. She reports that she does not use drugs.  Family History: family history  includes Cancer in her maternal grandfather; Healthy in her father; Lupus in her mother; Rheum arthritis in her mother. ,no history of gyn cancers  Review of Systems: A full review of systems was performed and negative except as noted in the HPI.    O:  BP 133/71   Pulse (!) 106   Temp 99.2 F (37.3 C) (Oral)   Resp 19   Ht 5\' 6"  (1.676 m)   Wt 101.6 kg   LMP 02/17/2023   BMI 36.15 kg/m  No results found for this or any previous visit (from the past 48 hour(s)).   Constitutional: NAD, AAOx3  HE/ENT: extraocular movements grossly intact, moist mucous membranes CV: RRR PULM: nl respiratory effort, CTABL Abd: gravid, non-tender, non-distended, soft  Ext: Non-tender, Nonedmeatous Psych: mood appropriate, speech normal Pelvic : deferred SVE:     NST: Baseline FHR: 150 beats/min Variability: moderate Accelerations: present Decelerations: absent Tocometry: flat Time: at least 20 minutes   Interpretation: Category I INDICATIONS: decreased fetal movement RESULTS:  A NST procedure was performed with FHR monitoring and a normal baseline established, appropriate time of 20-40 minutes of evaluation, and accels >2 seen w 15x15 characteristics.  Results show a REACTIVE NST.    Assessment: 27 y.o. [redacted]w[redacted]d here for antenatal surveillance during pregnancy.  Principle diagnosis: rash and decreased FM   Plan: Labor: not present.  Fetal Wellbeing: Reassuring Cat 1 tracing. Reactive NST  Continue prednisone, Benadryl and Claritin as prescribed Topical hydrocortisone cream BID Start oral iron supplements daily D/c home stable, precautions reviewed,  follow-up as scheduled.   ----- Chari Manning, CNM Certified Nurse Midwife [facility]

## 2023-10-31 ENCOUNTER — Other Ambulatory Visit: Payer: Self-pay | Admitting: Obstetrics and Gynecology

## 2023-10-31 DIAGNOSIS — Z01818 Encounter for other preprocedural examination: Secondary | ICD-10-CM

## 2023-11-11 ENCOUNTER — Inpatient Hospital Stay: Payer: BC Managed Care – PPO | Admitting: Anesthesiology

## 2023-11-11 ENCOUNTER — Other Ambulatory Visit: Payer: Self-pay

## 2023-11-11 ENCOUNTER — Encounter: Payer: Self-pay | Admitting: Obstetrics and Gynecology

## 2023-11-11 ENCOUNTER — Inpatient Hospital Stay
Admission: EM | Admit: 2023-11-11 | Discharge: 2023-11-13 | DRG: 806 | Disposition: A | Discharge status: Home / Self Care | Payer: BC Managed Care – PPO | Attending: Obstetrics and Gynecology | Admitting: Obstetrics and Gynecology

## 2023-11-11 DIAGNOSIS — O24419 Gestational diabetes mellitus in pregnancy, unspecified control: Secondary | ICD-10-CM | POA: Diagnosis present

## 2023-11-11 DIAGNOSIS — Z8616 Personal history of COVID-19: Secondary | ICD-10-CM | POA: Diagnosis not present

## 2023-11-11 DIAGNOSIS — Z01818 Encounter for other preprocedural examination: Secondary | ICD-10-CM

## 2023-11-11 DIAGNOSIS — O9081 Anemia of the puerperium: Secondary | ICD-10-CM | POA: Diagnosis not present

## 2023-11-11 DIAGNOSIS — D62 Acute posthemorrhagic anemia: Secondary | ICD-10-CM | POA: Diagnosis not present

## 2023-11-11 DIAGNOSIS — O24420 Gestational diabetes mellitus in childbirth, diet controlled: Principal | ICD-10-CM | POA: Diagnosis present

## 2023-11-11 DIAGNOSIS — O99892 Other specified diseases and conditions complicating childbirth: Secondary | ICD-10-CM | POA: Diagnosis present

## 2023-11-11 DIAGNOSIS — O35EXX0 Maternal care for other (suspected) fetal abnormality and damage, fetal genitourinary anomalies, not applicable or unspecified: Secondary | ICD-10-CM | POA: Diagnosis present

## 2023-11-11 DIAGNOSIS — R768 Other specified abnormal immunological findings in serum: Principal | ICD-10-CM

## 2023-11-11 DIAGNOSIS — O99344 Other mental disorders complicating childbirth: Secondary | ICD-10-CM | POA: Diagnosis present

## 2023-11-11 DIAGNOSIS — Z8261 Family history of arthritis: Secondary | ICD-10-CM | POA: Diagnosis not present

## 2023-11-11 DIAGNOSIS — O99213 Obesity complicating pregnancy, third trimester: Secondary | ICD-10-CM

## 2023-11-11 DIAGNOSIS — O35EXX1 Maternal care for other (suspected) fetal abnormality and damage, fetal genitourinary anomalies, fetus 1: Secondary | ICD-10-CM

## 2023-11-11 DIAGNOSIS — Z3A38 38 weeks gestation of pregnancy: Secondary | ICD-10-CM

## 2023-11-11 DIAGNOSIS — O99214 Obesity complicating childbirth: Secondary | ICD-10-CM | POA: Diagnosis present

## 2023-11-11 DIAGNOSIS — R000 Tachycardia, unspecified: Secondary | ICD-10-CM | POA: Diagnosis present

## 2023-11-11 DIAGNOSIS — F419 Anxiety disorder, unspecified: Secondary | ICD-10-CM | POA: Diagnosis present

## 2023-11-11 DIAGNOSIS — R7309 Other abnormal glucose: Secondary | ICD-10-CM

## 2023-11-11 LAB — CBC
HCT: 34.8 % — ABNORMAL LOW (ref 36.0–46.0)
HCT: 33.8 % — ABNORMAL LOW (ref 36.0–46.0)
Hemoglobin: 11.6 g/dL — ABNORMAL LOW (ref 12.0–15.0)
Hemoglobin: 11.1 g/dL — ABNORMAL LOW (ref 12.0–15.0)
MCH: 30.3 pg (ref 26.0–34.0)
MCH: 30.4 pg (ref 26.0–34.0)
MCHC: 33.3 g/dL (ref 30.0–36.0)
MCHC: 32.8 g/dL (ref 30.0–36.0)
MCV: 90.9 fL (ref 80.0–100.0)
MCV: 92.6 fL (ref 80.0–100.0)
Platelets: 243 10*3/uL (ref 150–400)
Platelets: 244 10*3/uL (ref 150–400)
RBC: 3.83 MIL/uL — ABNORMAL LOW (ref 3.87–5.11)
RBC: 3.65 MIL/uL — ABNORMAL LOW (ref 3.87–5.11)
RDW: 15 % (ref 11.5–15.5)
RDW: 14.9 % (ref 11.5–15.5)
WBC: 8.7 10*3/uL (ref 4.0–10.5)
WBC: 23.4 10*3/uL — ABNORMAL HIGH (ref 4.0–10.5)
nRBC: 0 % (ref 0.0–0.2)
nRBC: 0 % (ref 0.0–0.2)

## 2023-11-11 LAB — RPR: RPR Ser Ql: NONREACTIVE

## 2023-11-11 LAB — GLUCOSE, CAPILLARY
Glucose-Capillary: 82 mg/dL (ref 70–99)
Glucose-Capillary: 70 mg/dL (ref 70–99)
Glucose-Capillary: 112 mg/dL — ABNORMAL HIGH (ref 70–99)

## 2023-11-11 MED ORDER — METHYLERGONOVINE MALEATE 0.2 MG/ML IJ SOLN
0.2000 mg | Freq: Once | INTRAMUSCULAR | Status: AC
Start: 2023-11-11 — End: 2023-11-11
  Administered 2023-11-11: 0.2 mg via INTRAMUSCULAR

## 2023-11-11 MED ORDER — BUPIVACAINE HCL (PF) 0.25 % IJ SOLN
INTRAMUSCULAR | Status: DC | PRN
  Administered 2023-11-11 (×2): 4 mL via EPIDURAL

## 2023-11-11 MED ORDER — AMMONIA AROMATIC IN INHA
RESPIRATORY_TRACT | Status: AC
  Filled 2023-11-11: qty 10

## 2023-11-11 MED ORDER — MISOPROSTOL 50MCG HALF TABLET
50.0000 ug | ORAL_TABLET | Freq: Once | ORAL | Status: AC
  Administered 2023-11-11: 50 ug via VAGINAL
  Filled 2023-11-11: qty 1

## 2023-11-11 MED ORDER — SIMETHICONE 80 MG PO CHEW: 80.0000 mg | CHEWABLE_TABLET | ORAL | Status: DC | PRN

## 2023-11-11 MED ORDER — MAGNESIUM HYDROXIDE 400 MG/5ML PO SUSP: 30.0000 mL | ORAL | Status: DC | PRN

## 2023-11-11 MED ORDER — BUTORPHANOL TARTRATE 2 MG/ML IJ SOLN: 1.0000 mg | INTRAMUSCULAR | Status: DC | PRN

## 2023-11-11 MED ORDER — OXYTOCIN-SODIUM CHLORIDE 30-0.9 UT/500ML-% IV SOLN
1.0000 m[IU]/min | INTRAVENOUS | Status: DC
Start: 2023-11-11 — End: 2023-11-13
  Administered 2023-11-11: 2 m[IU]/min via INTRAVENOUS

## 2023-11-11 MED ORDER — CLONAZEPAM 0.5 MG PO TABS
0.5000 mg | ORAL_TABLET | Freq: Three times a day (TID) | ORAL | Status: DC
  Filled 2023-11-11: qty 1

## 2023-11-11 MED ORDER — METHYLERGONOVINE MALEATE 0.2 MG/ML IJ SOLN
INTRAMUSCULAR | Status: AC
  Filled 2023-11-11: qty 1

## 2023-11-11 MED ORDER — ACETAMINOPHEN 325 MG PO TABS: 650.0000 mg | ORAL_TABLET | ORAL | Status: DC | PRN

## 2023-11-11 MED ORDER — PHENYLEPHRINE 80 MCG/ML (10ML) SYRINGE FOR IV PUSH (FOR BLOOD PRESSURE SUPPORT): 80.0000 ug | PREFILLED_SYRINGE | INTRAVENOUS | Status: DC | PRN

## 2023-11-11 MED ORDER — LIDOCAINE HCL (PF) 1 % IJ SOLN
INTRAMUSCULAR | Status: AC
  Filled 2023-11-11: qty 30

## 2023-11-11 MED ORDER — TRANEXAMIC ACID-NACL 1000-0.7 MG/100ML-% IV SOLN
INTRAVENOUS | Status: AC
  Administered 2023-11-11: 1000 mg via INTRAVENOUS
  Filled 2023-11-11: qty 100

## 2023-11-11 MED ORDER — OXYTOCIN-SODIUM CHLORIDE 30-0.9 UT/500ML-% IV SOLN
2.5000 [IU]/h | INTRAVENOUS | Status: DC
  Filled 2023-11-11: qty 500

## 2023-11-11 MED ORDER — LIDOCAINE-EPINEPHRINE (PF) 1.5 %-1:200000 IJ SOLN
INTRAMUSCULAR | Status: DC | PRN
  Administered 2023-11-11: 3 mL via EPIDURAL

## 2023-11-11 MED ORDER — LACTATED RINGERS IV BOLUS
500.0000 mL | Freq: Once | INTRAVENOUS | Status: AC
  Administered 2023-11-11: 500 mL via INTRAVENOUS

## 2023-11-11 MED ORDER — MISOPROSTOL 50MCG HALF TABLET
50.0000 ug | ORAL_TABLET | Freq: Once | ORAL | Status: AC
  Administered 2023-11-11: 50 ug via ORAL
  Filled 2023-11-11: qty 1

## 2023-11-11 MED ORDER — LACTATED RINGERS IV SOLN: INTRAVENOUS | Status: AC

## 2023-11-11 MED ORDER — MISOPROSTOL 50MCG HALF TABLET
ORAL_TABLET | ORAL | Status: AC
  Filled 2023-11-11: qty 1

## 2023-11-11 MED ORDER — OXYTOCIN 10 UNIT/ML IJ SOLN
INTRAMUSCULAR | Status: AC
  Filled 2023-11-11: qty 2

## 2023-11-11 MED ORDER — ONDANSETRON HCL 4 MG/2ML IJ SOLN
4.0000 mg | Freq: Four times a day (QID) | INTRAMUSCULAR | Status: DC | PRN
  Administered 2023-11-11: 4 mg via INTRAVENOUS
  Filled 2023-11-11: qty 2

## 2023-11-11 MED ORDER — ONDANSETRON HCL 4 MG/2ML IJ SOLN
4.0000 mg | INTRAMUSCULAR | Status: DC | PRN
  Administered 2023-11-12: 4 mg via INTRAVENOUS
  Filled 2023-11-11: qty 2

## 2023-11-11 MED ORDER — WITCH HAZEL-GLYCERIN EX PADS
1.0000 | MEDICATED_PAD | CUTANEOUS | Status: DC | PRN
  Filled 2023-11-11 (×2): qty 100

## 2023-11-11 MED ORDER — LACTATED RINGERS IV SOLN: 500.0000 mL | INTRAVENOUS | Status: AC | PRN

## 2023-11-11 MED ORDER — OXYCODONE HCL 5 MG PO TABS: 10.0000 mg | ORAL_TABLET | ORAL | Status: DC | PRN

## 2023-11-11 MED ORDER — OXYTOCIN BOLUS FROM INFUSION
333.0000 mL | Freq: Once | INTRAVENOUS | Status: AC
  Administered 2023-11-11: 333 mL via INTRAVENOUS

## 2023-11-11 MED ORDER — EPHEDRINE 5 MG/ML INJ: 10.0000 mg | INTRAVENOUS | Status: DC | PRN

## 2023-11-11 MED ORDER — LIDOCAINE HCL (PF) 1 % IJ SOLN
30.0000 mL | INTRAMUSCULAR | Status: AC | PRN
  Administered 2023-11-11: 30 mL via SUBCUTANEOUS

## 2023-11-11 MED ORDER — PRENATAL MULTIVITAMIN CH: 1.0000 | ORAL_TABLET | Freq: Every day | ORAL | Status: DC

## 2023-11-11 MED ORDER — COCONUT OIL OIL: 1.0000 | TOPICAL_OIL | Status: DC | PRN

## 2023-11-11 MED ORDER — ZOLPIDEM TARTRATE 5 MG PO TABS: 5.0000 mg | ORAL_TABLET | Freq: Every evening | ORAL | Status: DC | PRN

## 2023-11-11 MED ORDER — FENTANYL-BUPIVACAINE-NACL 0.5-0.125-0.9 MG/250ML-% EP SOLN
EPIDURAL | Status: AC
  Filled 2023-11-11: qty 250

## 2023-11-11 MED ORDER — MISOPROSTOL 200 MCG PO TABS
ORAL_TABLET | ORAL | Status: AC
  Filled 2023-11-11: qty 4

## 2023-11-11 MED ORDER — OXYCODONE HCL 5 MG PO TABS: 5.0000 mg | ORAL_TABLET | ORAL | Status: DC | PRN

## 2023-11-11 MED ORDER — IBUPROFEN 600 MG PO TABS
600.0000 mg | ORAL_TABLET | Freq: Four times a day (QID) | ORAL | Status: DC
  Administered 2023-11-12 (×3): 600 mg via ORAL
  Filled 2023-11-11 (×4): qty 1

## 2023-11-11 MED ORDER — CLONAZEPAM 0.1 MG/ML ORAL SUSPENSION: 0.5000 mg | Freq: Three times a day (TID) | ORAL | Status: DC

## 2023-11-11 MED ORDER — MEASLES, MUMPS & RUBELLA VAC IJ SOLR
0.5000 mL | Freq: Once | INTRAMUSCULAR | Status: DC
  Filled 2023-11-11: qty 0.5

## 2023-11-11 MED ORDER — ACETAMINOPHEN 325 MG PO TABS
650.0000 mg | ORAL_TABLET | ORAL | Status: DC | PRN
  Administered 2023-11-11: 650 mg via ORAL
  Filled 2023-11-11: qty 2

## 2023-11-11 MED ORDER — TRANEXAMIC ACID-NACL 1000-0.7 MG/100ML-% IV SOLN: 1000.0000 mg | INTRAVENOUS | Status: AC

## 2023-11-11 MED ORDER — LIDOCAINE HCL (PF) 1 % IJ SOLN
INTRAMUSCULAR | Status: DC | PRN
  Administered 2023-11-11 (×4): 1 mL via SUBCUTANEOUS

## 2023-11-11 MED ORDER — DIPHENHYDRAMINE HCL 25 MG PO CAPS: 25.0000 mg | ORAL_CAPSULE | Freq: Four times a day (QID) | ORAL | Status: DC | PRN

## 2023-11-11 MED ORDER — OXYCODONE-ACETAMINOPHEN 5-325 MG PO TABS: 2.0000 | ORAL_TABLET | ORAL | Status: DC | PRN

## 2023-11-11 MED ORDER — MISOPROSTOL 25 MCG QUARTER TABLET
25.0000 ug | ORAL_TABLET | ORAL | Status: DC | PRN
  Filled 2023-11-11: qty 1

## 2023-11-11 MED ORDER — SENNOSIDES-DOCUSATE SODIUM 8.6-50 MG PO TABS
2.0000 | ORAL_TABLET | ORAL | Status: DC
  Administered 2023-11-12 – 2023-11-13 (×2): 2 via ORAL
  Filled 2023-11-11 (×2): qty 2

## 2023-11-11 MED ORDER — DIPHENHYDRAMINE HCL 50 MG/ML IJ SOLN
12.5000 mg | INTRAMUSCULAR | Status: DC | PRN
  Administered 2023-11-11: 12.5 mg via INTRAVENOUS
  Filled 2023-11-11: qty 1

## 2023-11-11 MED ORDER — ONDANSETRON HCL 4 MG PO TABS: 4.0000 mg | ORAL_TABLET | ORAL | Status: DC | PRN

## 2023-11-11 MED ORDER — TERBUTALINE SULFATE 1 MG/ML IJ SOLN
0.2500 mg | Freq: Once | INTRAMUSCULAR | Status: DC | PRN
Start: 2023-11-11 — End: 2023-11-13

## 2023-11-11 MED ORDER — FERROUS SULFATE 325 (65 FE) MG PO TABS
325.0000 mg | ORAL_TABLET | Freq: Two times a day (BID) | ORAL | Status: DC
  Filled 2023-11-11: qty 1

## 2023-11-11 MED ORDER — FENTANYL CITRATE (PF) 100 MCG/2ML IJ SOLN
50.0000 ug | INTRAMUSCULAR | Status: DC | PRN
Start: 2023-11-11 — End: 2023-11-13

## 2023-11-11 MED ORDER — TERBUTALINE SULFATE 1 MG/ML IJ SOLN: 0.2500 mg | Freq: Once | INTRAMUSCULAR | Status: DC | PRN

## 2023-11-11 MED ORDER — BENZOCAINE-MENTHOL 20-0.5 % EX AERO
1.0000 | INHALATION_SPRAY | CUTANEOUS | Status: DC | PRN
  Filled 2023-11-11 (×2): qty 56

## 2023-11-11 MED ORDER — SOD CITRATE-CITRIC ACID 500-334 MG/5ML PO SOLN
30.0000 mL | ORAL | Status: DC | PRN
  Administered 2023-11-11: 30 mL via ORAL

## 2023-11-11 MED ORDER — DIBUCAINE (PERIANAL) 1 % EX OINT
1.0000 | TOPICAL_OINTMENT | CUTANEOUS | Status: DC | PRN
  Filled 2023-11-11 (×3): qty 28

## 2023-11-11 MED ORDER — OXYCODONE-ACETAMINOPHEN 5-325 MG PO TABS
1.0000 | ORAL_TABLET | ORAL | Status: DC | PRN
  Administered 2023-11-12 (×3): 1 via ORAL
  Filled 2023-11-11 (×3): qty 1

## 2023-11-11 MED ORDER — LACTATED RINGERS IV SOLN
500.0000 mL | Freq: Once | INTRAVENOUS | Status: AC
  Administered 2023-11-11: 500 mL via INTRAVENOUS

## 2023-11-11 MED ORDER — FENTANYL-BUPIVACAINE-NACL 0.5-0.125-0.9 MG/250ML-% EP SOLN
12.0000 mL/h | EPIDURAL | Status: DC | PRN
  Administered 2023-11-11: 12 mL/h via EPIDURAL

## 2023-11-11 MED ORDER — TRANEXAMIC ACID-NACL 1000-0.7 MG/100ML-% IV SOLN
INTRAVENOUS | Status: AC
  Filled 2023-11-11: qty 100

## 2023-11-11 NOTE — Anesthesia Procedure Notes (Signed)
Epidural Patient location during procedure: OB Start time: 11/11/2023 11:48 AM End time: 11/11/2023 11:48 AM  Staffing Anesthesiologist: Areliz Rothman, Cleda Mccreedy, MD Resident/CRNA: Hezzie Bump, CRNA Performed: resident/CRNA and anesthesiologist   Preanesthetic Checklist Completed: patient identified, IV checked, site marked, risks and benefits discussed, surgical consent, monitors and equipment checked, pre-op evaluation and timeout performed  Epidural Patient position: sitting Prep: ChloraPrep Patient monitoring: heart rate, continuous pulse ox and blood pressure Approach: midline Location: L3-L4 Injection technique: LOR saline  Needle:  Needle type: Tuohy  Needle gauge: 17 G Needle length: 9 cm and 9 Needle insertion depth: 6 cm Catheter type: closed end flexible Catheter size: 19 Gauge Catheter at skin depth: 11 cm Test dose: negative and 1.5% lidocaine with Epi 1:200 K  Assessment Sensory level: T10 Events: blood not aspirated, no cerebrospinal fluid, injection not painful, no injection resistance, no paresthesia and negative IV test  Additional Notes multiple attempt by CRNA then final attempts by MD Pt. Evaluated and documentation done after procedure finished. Patient identified. Risks/Benefits/Options discussed with patient including but not limited to bleeding, infection, nerve damage, paralysis, failed block, incomplete pain control, headache, blood pressure changes, nausea, vomiting, reactions to medication both or allergic, itching and postpartum back pain. Confirmed with bedside nurse the patient's most recent platelet count. Confirmed with patient that they are not currently taking any anticoagulation, have any bleeding history or any family history of bleeding disorders. Patient expressed understanding and wished to proceed. All questions were answered. Sterile technique was used throughout the entire procedure. Please see nursing notes for vital signs. Test  dose was given through epidural catheter and negative prior to continuing to dose epidural or start infusion. Warning signs of high block given to the patient including shortness of breath, tingling/numbness in hands, complete motor block, or any concerning symptoms with instructions to call for help. Patient was given instructions on fall risk and not to get out of bed. All questions and concerns addressed with instructions to call with any issues or inadequate analgesia.    Patient tolerated the insertion well without immediate complications.Reason for block:procedure for pain

## 2023-11-11 NOTE — H&P (Signed)
Rachel Ortega is a 27 y.o. female presenting for IOL for uncontrolled GDM A1 .Issues in Pregnancy :  A1gdm . Several glucose levels elevated .  Repeat parvo abs neg  Fetal multicystic dysplastic kidney  Obesity  Oral HSV1 + ANA + covid in 2 nd trimester  OB History     Gravida  2   Para  1   Term  1   Preterm      AB      Living  1      SAB      IAB      Ectopic      Multiple      Live Births  1          Past Medical History:  Diagnosis Date   Depression    Medical history non-contributory    Preeclampsia, third trimester 02/05/2018   Past Surgical History:  Procedure Laterality Date   TONSILLECTOMY Bilateral    TONSILLECTOMY     WISDOM TOOTH EXTRACTION Bilateral    Family History: family history includes Cancer in her maternal grandfather; Healthy in her father; Lupus in her mother; Rheum arthritis in her mother. Social History:  reports that she has never smoked. She has never used smokeless tobacco. She reports that she does not currently use alcohol. She reports that she does not use drugs.     Maternal Diabetes: Yes:  Diabetes Type:  Diet controlled Genetic Screening: Normal Maternal Ultrasounds/Referrals: Fetal Kidney Anomalies Fetal Ultrasounds or other Referrals:  Other: dysplastic kidney ,  Maternal Substance Abuse:  No Significant Maternal Medications:  , . None current  Significant Maternal Lab Results:  Group B Strep negative Number of Prenatal Visits:greater than 3 verified prenatal visits Maternal Vaccinations:TDap and Flu, no RSV  Other Comments:  None  Review of Systems History Dilation: 4 Effacement (%): 80 Station: Ballotable Exam by:: Scherm MD  Exam at 1020: by TJS 4/ 90 / -1 AROM blood tinged .   Blood pressure 138/84, pulse 99, temperature 98 F (36.7 C), temperature source Oral, resp. rate 18, height 5\' 6"  (1.676 m), weight 103.4 kg, last menstrual period 02/17/2023. Exam Physical Exam  Lungs CTA   CV RRR  Efm :  CAt 1 monitoring  CTX 1-2 min  Prenatal labs: ABO, Rh: --/--/A POS (11/19 0981) Antibody: NEG (11/19 1914) Rubella: Immune (05/15 0000)VZ immune  RPR: NON REACTIVE (09/30 1822)  HBsAg: Negative (05/15 0000) hep c neg  HIV: Non Reactive (09/30 1822)  GBS: NEGATIVE/-- (09/30 1959)   Assessment/Plan: IOL for GDM .  Reassuring fetal monitoring  AROM . Add Pitocin if CTX decrease . CLE prn pt  Neonatology aware of fetal kidney anomaly    Rachel Ortega 11/11/2023, 10:25 AM

## 2023-11-11 NOTE — Anesthesia Preprocedure Evaluation (Signed)
Anesthesia Evaluation  Patient identified by MRN, date of birth, ID band Patient awake    Reviewed: Allergy & Precautions, NPO status , Patient's Chart, lab work & pertinent test results  History of Anesthesia Complications Negative for: history of anesthetic complications  Airway Mallampati: III  TM Distance: <3 FB Neck ROM: full    Dental  (+) Chipped   Pulmonary neg pulmonary ROS   Pulmonary exam normal        Cardiovascular Exercise Tolerance: Good hypertension, negative cardio ROS Normal cardiovascular exam     Neuro/Psych    GI/Hepatic negative GI ROS,,,  Endo/Other  diabetes    Renal/GU Renal disease  negative genitourinary   Musculoskeletal   Abdominal   Peds  Hematology negative hematology ROS (+)   Anesthesia Other Findings Past Medical History: No date: Depression No date: Medical history non-contributory 02/05/2018: Preeclampsia, third trimester  Past Surgical History: No date: TONSILLECTOMY; Bilateral No date: TONSILLECTOMY No date: WISDOM TOOTH EXTRACTION; Bilateral  BMI    Body Mass Index: 36.80 kg/m      Reproductive/Obstetrics (+) Pregnancy                             Anesthesia Physical Anesthesia Plan  ASA: 3  Anesthesia Plan: Epidural   Post-op Pain Management:    Induction:   PONV Risk Score and Plan:   Airway Management Planned: Natural Airway  Additional Equipment:   Intra-op Plan:   Post-operative Plan:   Informed Consent: I have reviewed the patients History and Physical, chart, labs and discussed the procedure including the risks, benefits and alternatives for the proposed anesthesia with the patient or authorized representative who has indicated his/her understanding and acceptance.     Dental Advisory Given  Plan Discussed with: Anesthesiologist  Anesthesia Plan Comments: (Patient reports no bleeding problems and no  anticoagulant use.   Patient consented for risks of anesthesia including but not limited to:  - adverse reactions to medications - risk of bleeding, infection and or nerve damage from epidural that could lead to paralysis - risk of headache or failed epidural - nerve damage due to positioning - that if epidural is used for C-section that there is a chance of epidural failure requiring spinal placement or conversion to GA - Damage to heart, brain, lungs, other parts of body or loss of life  Patient voiced understanding and assent.)       Anesthesia Quick Evaluation

## 2023-11-11 NOTE — Discharge Summary (Addendum)
Postpartum Discharge Summary  Patient Name: Rachel Ortega DOB: [birth year] MRN: [id number]  Date of admission: 11/11/2023 Delivery date:11/11/2023 Delivering provider: Suzy Bouchard Date of discharge: 11/13/2023  Primary OB: [facility] QMV:HQIONGE'X last menstrual period was 02/17/2023. EDC Estimated Date of Delivery: 11/24/23 Gestational Age at Delivery: [redacted]w[redacted]d   Admitting diagnosis: Gestational diabetes [O24.419] Intrauterine pregnancy: [redacted]w[redacted]d     Secondary diagnosis:   Principal Problem:   NSVD (normal spontaneous vaginal delivery) Active Problems:   Gestational diabetes   Postpartum hemorrhage   Discharge Diagnosis: Term Pregnancy Delivered, GDM A1, and [facility]      Hospital course: Induction of Labor With Vaginal Delivery   27 y.o. yo [id number] at [redacted]w[redacted]d was admitted to the hospital 11/11/2023 for induction of labor.  Indication for induction:  GDM A1 .  Patient had an labor course complicated by PPH of  Membrane Rupture Time/Date: 10:23 AM,11/11/2023  Delivery Method:Vaginal, Spontaneous Operative Delivery:N/A Episiotomy: None Lacerations:  1st degree Details of delivery can be found in separate delivery note.  Patient had a postpartum course complicated by a hgb of 6.9, she was give Venofer x1 dose and 1u pRBC followed by a hgb of 9.5 and no symptoms of anemia. Patient is discharged home 11/13/23.  Newborn Data: Birth date:11/11/2023 Birth time:5:35 PM Gender:Female Living status:Living Apgars:9 ,9  Weight:3460 g                                            Post partum procedures:blood transfusion and Venofer Induction:: AROM, Pitocin, and Cytotec Complications: Hemorrhage>1038mL Delivery Type: spontaneous vaginal delivery Anesthesia: epidural anesthesia Placenta: spontaneous To Pathology: No   Prenatal labs: ABO, Rh: --/--/A POS (11/19 4401) Antibody: NEG (11/19 0272) Rubella: Immune (05/15 0000)VZ immune  RPR: NON REACTIVE (09/30 1822)   HBsAg: Negative (05/15 0000) hep c neg  HIV: Non Reactive (09/30 1822)  GBS: NEGATIVE/-- (09/30 1959)  Magnesium Sulfate received: No BMZ received: No Rhophylac:was not indicated MMR: not indicated Varivax vaccine given: was not indicated T-DaP:Given prenatally Flu: Given prenatally  Transfusion:Yes  Physical exam  Vitals:   11/12/23 1552 11/12/23 1937 11/13/23 0126 11/13/23 0758  BP: 100/65 110/68 111/75 117/65  Pulse: (!) 105 (!) 106 99 95  Resp: 20 20 18 20   Temp: 98.6 F (37 C) 98.5 F (36.9 C) 98.9 F (37.2 C) 98.5 F (36.9 C)  TempSrc: Oral Oral Oral Oral  SpO2: 99% 100% 100% 100%  Weight:      Height:       General: alert, cooperative, and no distress Lochia: appropriate Uterine Fundus: firm Perineum:minimal edema/repair well approximated Incision: n/a DVT Evaluation: No evidence of DVT seen on physical exam.  Labs: Lab Results  Component Value Date   WBC 12.7 (H) 11/12/2023   HGB 9.5 (L) 11/12/2023   HCT 28.5 (L) 11/12/2023   MCV 93.1 11/12/2023   PLT 205 11/12/2023      Latest Ref Rng & Units 09/19/2023    9:57 PM  CMP  Glucose 70 - 99 mg/dL 536   BUN 6 - 20 mg/dL 8   Creatinine 6.44 - 0.34 mg/dL 7.42   Sodium 595 - 638 mmol/L 135   Potassium 3.5 - 5.1 mmol/L 3.7   Chloride 98 - 111 mmol/L 104   CO2 22 - 32 mmol/L 21   Calcium 8.9 - 10.3 mg/dL 8.5   Total Protein 6.5 -  8.1 g/dL 6.5   Total Bilirubin 0.3 - 1.2 mg/dL 0.6   Alkaline Phos 38 - 126 U/L 64   AST 15 - 41 U/L 12   ALT 0 - 44 U/L 10    Edinburgh Score:    11/13/2023    1:35 AM  Edinburgh Postnatal Depression Scale Screening Tool  I have been able to laugh and see the funny side of things. 0  I have looked forward with enjoyment to things. 0  I have blamed myself unnecessarily when things went wrong. 2  I have been anxious or worried for no good reason. 1  I have felt scared or panicky for no good reason. 1  Things have been getting on top of me. 1  I have been so unhappy  that I have had difficulty sleeping. 1  I have felt sad or miserable. 1  I have been so unhappy that I have been crying. 1  The thought of harming myself has occurred to me. 0  Edinburgh Postnatal Depression Scale Total 8    Risk assessment for postpartum VTE and prophylactic treatment: Very high risk factors: None High risk factors: None Moderate risk factors: PPH > and BMI 30-40 kg/m2  Postpartum VTE prophylaxis with LMWH not indicated  After visit meds:  Allergies as of 11/13/2023       Reactions   Codeine Hives, Itching   Shrimp (diagnostic)    Hydrocod Poli-chlorphe Poli Er Itching, Other (See Comments)   Sulfa Antibiotics Other (See Comments)   Headaches  Headaches         Medication List     STOP taking these medications    Accu-Chek Guide Me w/Device Kit   Accu-Chek Guide test strip Generic drug: glucose blood   Accu-Chek Softclix Lancets lancets   aspirin EC 81 MG tablet   hydrocortisone cream 1 %       TAKE these medications    acetaminophen 325 MG tablet Commonly known as: TYLENOL Take 2 tablets (650 mg total) by mouth every 6 (six) hours as needed (for pain scale < 4  OR  temperature  >/=  100.5 F).   doxylamine (Sleep) 25 MG tablet Commonly known as: UNISOM Take 25 mg by mouth at bedtime as needed for sleep.   EPINEPHrine 0.3 mg/0.3 mL Soaj injection Commonly known as: EPI-PEN Inject into the muscle.   ferrous sulfate 325 (65 FE) MG tablet Take 1 tablet (325 mg total) by mouth daily with breakfast. Take with Vitamin C   fexofenadine 60 MG tablet Commonly known as: ALLEGRA Take by mouth.   FLINTSTONES GUMMIES PO Take 2 tablets by mouth once.   ibuprofen 600 MG tablet Commonly known as: ADVIL Take 1 tablet (600 mg total) by mouth every 6 (six) hours as needed.   venlafaxine 37.5 MG tablet Commonly known as: EFFEXOR Take 37.5 mg by mouth once.       Discharge home in stable condition Infant Feeding: Bottle Infant  Disposition:home with mother Discharge instruction: per After Visit Summary and Postpartum booklet. Activity: Advance as tolerated. Pelvic rest for 6 weeks.  Diet: routine diet Anticipated Birth Control: BTL done PP Postpartum Appointment:6 weeks Additional Postpartum F/U:  none Future Appointments:No future appointments. Follow up Visit:  Plan:  YAHIRA DEMERCHANT was discharged to home in good condition. Follow-up appointment as directed.    SignedCyril Mourning 11/13/2023 9:09 AM

## 2023-11-11 NOTE — Progress Notes (Signed)
Patient ID: Rachel Ortega, female   DOB: February 02, 1996, 27 y.o.   MRN: [id number] Cle in place   Cat1 fetal monitoring  Ctx pattern decreased  Add Pitocin augmentation Add Stadol 1mg  q 1 hr prn moderate anxiety

## 2023-11-11 NOTE — Plan of Care (Signed)
  Problem: Education: Goal: Knowledge of Childbirth will improve Outcome: Completed/Met Goal: Ability to make informed decisions regarding treatment and plan of care will improve Outcome: Completed/Met Goal: Ability to state and carry out methods to decrease the pain will improve Outcome: Completed/Met   Problem: Coping: Goal: Ability to verbalize concerns and feelings about labor and delivery will improve Outcome: Completed/Met   Problem: Life Cycle: Goal: Ability to make normal progression through stages of labor will improve Outcome: Completed/Met Goal: Ability to effectively push during vaginal delivery will improve Outcome: Completed/Met   Problem: Role Relationship: Goal: Will demonstrate positive interactions with the child Outcome: Completed/Met   Problem: Safety: Goal: Risk of complications during labor and delivery will decrease Outcome: Completed/Met   Problem: Pain Management: Goal: Relief or control of pain from uterine contractions will improve Outcome: Completed/Met

## 2023-11-11 NOTE — Progress Notes (Signed)
Patient ID: Rachel Ortega, female   DOB: 08/16/1996, 27 y.o.   MRN: [id number] Called to see patient after she felt like she was going to pass out  No significant additional bleeding . TXA just finished . I examined her and UTX firm  and small additional blood in vault.  VSS no hypotension documented through this   QBL 1830 cc . STat CBC ordered and IVF bolus . Possible blood transfusion if h/h low .

## 2023-11-12 LAB — CBC
HCT: 24.5 % — ABNORMAL LOW (ref 36.0–46.0)
HCT: 20.7 % — ABNORMAL LOW (ref 36.0–46.0)
HCT: 28.5 % — ABNORMAL LOW (ref 36.0–46.0)
Hemoglobin: 8.1 g/dL — ABNORMAL LOW (ref 12.0–15.0)
Hemoglobin: 6.9 g/dL — ABNORMAL LOW (ref 12.0–15.0)
Hemoglobin: 9.5 g/dL — ABNORMAL LOW (ref 12.0–15.0)
MCH: 30.3 pg (ref 26.0–34.0)
MCH: 30.8 pg (ref 26.0–34.0)
MCH: 31 pg (ref 26.0–34.0)
MCHC: 33.1 g/dL (ref 30.0–36.0)
MCHC: 33.3 g/dL (ref 30.0–36.0)
MCHC: 33.3 g/dL (ref 30.0–36.0)
MCV: 91.8 fL (ref 80.0–100.0)
MCV: 92.4 fL (ref 80.0–100.0)
MCV: 93.1 fL (ref 80.0–100.0)
Platelets: 202 10*3/uL (ref 150–400)
Platelets: 164 10*3/uL (ref 150–400)
Platelets: 205 10*3/uL (ref 150–400)
RBC: 2.67 MIL/uL — ABNORMAL LOW (ref 3.87–5.11)
RBC: 2.24 MIL/uL — ABNORMAL LOW (ref 3.87–5.11)
RBC: 3.06 MIL/uL — ABNORMAL LOW (ref 3.87–5.11)
RDW: 15.3 % (ref 11.5–15.5)
RDW: 15.2 % (ref 11.5–15.5)
RDW: 15 % (ref 11.5–15.5)
WBC: 13.9 10*3/uL — ABNORMAL HIGH (ref 4.0–10.5)
WBC: 11.1 10*3/uL — ABNORMAL HIGH (ref 4.0–10.5)
WBC: 12.7 10*3/uL — ABNORMAL HIGH (ref 4.0–10.5)
nRBC: 0 % (ref 0.0–0.2)
nRBC: 0 % (ref 0.0–0.2)
nRBC: 0 % (ref 0.0–0.2)

## 2023-11-12 LAB — PREPARE RBC (CROSSMATCH)

## 2023-11-12 MED ORDER — LACTATED RINGERS IV BOLUS
500.0000 mL | Freq: Once | INTRAVENOUS | Status: AC
Start: 2023-11-12 — End: 2023-11-12
  Administered 2023-11-12: 500 mL via INTRAVENOUS

## 2023-11-12 MED ORDER — VENLAFAXINE HCL 37.5 MG PO TABS: 37.5000 mg | ORAL_TABLET | Freq: Every day | ORAL | Status: DC

## 2023-11-12 MED ORDER — VENLAFAXINE HCL ER 37.5 MG PO CP24
37.5000 mg | ORAL_CAPSULE | Freq: Every day | ORAL | Status: DC
Start: 2023-11-12 — End: 2023-11-13
  Filled 2023-11-12 (×2): qty 1

## 2023-11-12 MED ORDER — IBUPROFEN 600 MG PO TABS
600.0000 mg | ORAL_TABLET | Freq: Four times a day (QID) | ORAL | Status: DC
  Administered 2023-11-13 (×2): 600 mg via ORAL
  Filled 2023-11-12: qty 1

## 2023-11-12 MED ORDER — VENLAFAXINE HCL ER 37.5 MG PO CP24: 37.5000 mg | ORAL_CAPSULE | Freq: Every day | ORAL | Status: DC

## 2023-11-12 MED ORDER — SODIUM CHLORIDE 0.9% IV SOLUTION: Freq: Once | INTRAVENOUS | Status: AC

## 2023-11-12 MED ORDER — IRON SUCROSE 300 MG IVPB - SIMPLE MED
300.0000 mg | Freq: Once | Status: AC
  Administered 2023-11-12: 300 mg via INTRAVENOUS
  Filled 2023-11-12: qty 300

## 2023-11-12 NOTE — Anesthesia Postprocedure Evaluation (Signed)
Anesthesia Post Note  Patient: Rachel Ortega  Procedure(s) Performed: AN AD HOC LABOR EPIDURAL  Patient location during evaluation: Mother Baby Anesthesia Type: Epidural Level of consciousness: awake and alert Pain management: pain level controlled Vital Signs Assessment: post-procedure vital signs reviewed and stable Respiratory status: spontaneous breathing, nonlabored ventilation and respiratory function stable Cardiovascular status: stable Postop Assessment: no headache, no backache and epidural receding Anesthetic complications: no Comments: Upon initial assessment pt complained of right foot numbness, however upon further evaluation sensation and motor function found to be equal and normal. Pt was then able to ambulate with normal sensation and movement, and states she feels like she has returned to baseline.    No notable events documented.   Last Vitals:  Vitals:   11/12/23 0203 11/12/23 0305  BP:  112/64  Pulse:  89  Resp:  18  Temp: 37.2 C 37.2 C  SpO2:  100%    Last Pain:  Vitals:   11/12/23 0526  TempSrc:   PainSc: 1                  Audelia Knape

## 2023-11-12 NOTE — Progress Notes (Signed)
Postpartum Day  1  Subjective: 27 y.o. [id number] postpartum day #1 status post normal spontaneous vaginal delivery and PPH of .  She received TXA and oxytocin after delivery for PPH. She is ambulating, is tolerating po, is voiding spontaneously.  Her pain is well controlled on PO pain medications. Her lochia is less than menses.  She reports increased fatigue and dizziness after she ambulates. Felt worse when she came out of the shower.    Objective: BP (!) 99/57 (BP Location: Left Arm)   Pulse (!) 107   Temp 98.3 F (36.8 C) (Oral)   Resp 18   Ht 5\' 6"  (1.676 m)   Wt 103.4 kg   LMP 02/17/2023   SpO2 100%   Breastfeeding Unknown   BMI 36.80 kg/m    Physical Exam:  General: alert, cooperative, no distress, and pale Breasts: soft/nontender Pulm: nl effort Abdomen: soft, non-tender, active bowel sounds Uterine Fundus: firm Perineum: minimal edema, repair well approximated Lochia: appropriate DVT Evaluation: No evidence of DVT seen on physical exam.     Latest Ref Rng & Units 11/12/2023   11:44 AM 11/12/2023    5:58 AM 11/11/2023    6:58 PM  CBC  WBC 4.0 - 10.5 K/uL 11.1  13.9  23.4   Hemoglobin 12.0 - 15.0 g/dL 6.9  8.1  41.3   Hematocrit 36.0 - 46.0 % 20.7  24.5  33.8   Platelets 150 - 400 K/uL 164  202  244      Assessment/Plan: 27 y.o. G2P2002 postpartum day # 1  1. Continue routine postpartum care  2. Infant feeding status: formula feeding -Encouraged snug fitting bra, cold application, Tylenol PRN, and cabbage leaves for engorgement for formula feeding   3. Acute blood loss anemia - clinically significant.  -Symptomatic with fatigue and dizziness with ambulation  -Received LR 500 ml bolus and IV venofer  -Hgb 11.1->8.1->6.9 -Mild tachycardia present  -Intervention: IV iron transfusion with venofer given , recommend blood transfusion with 1 unit pRBC, and continue to monitor H&H -Reviewed risk/benefits of blood transfusion with Rachel Ortega and her family   -Rachel Ortega consents to blood transfusion for acute blood loss anemia   Disposition: continue inpatient postpartum care    LOS: 1 day   Rachel Ortega, CNM 11/12/2023, 12:24 PM   ----- Rachel Ortega  Certified Nurse Midwife [facility]

## 2023-11-13 LAB — TYPE AND SCREEN
ABO/RH(D): A POS
Antibody Screen: NEGATIVE
Unit division: 0

## 2023-11-13 LAB — BPAM RBC
Blood Product Expiration Date: 202412202359
ISSUE DATE / TIME: 202411201327
Unit Type and Rh: 6200

## 2023-11-13 MED ORDER — IBUPROFEN 600 MG PO TABS: 600.0000 mg | ORAL_TABLET | Freq: Four times a day (QID) | ORAL | Status: AC | PRN

## 2023-11-13 MED ORDER — ACETAMINOPHEN 325 MG PO TABS: 650.0000 mg | ORAL_TABLET | Freq: Four times a day (QID) | ORAL | Status: AC | PRN

## 2023-11-13 NOTE — Discharge Instructions (Signed)
Mental Health Resources for Therapy  [facility] [place]  Make an appointment: [phone]  [facility] - Located in [facility]  Make an appointment: [phone]  [facility] [place]  Make an appointment: [phone]  [address]  [place], [place] [address]  For additional local or virtual therapy appointment options, you can visit [email or web address] and filter by insurance and city to find local providers. Provider profiles include specialties to find the best fit for you!  

## 2023-11-13 NOTE — Progress Notes (Signed)
TOC consult for edinburgh score of 8.   Per policy TOC to complete assessments for 10 or higher, at this time patient's depression score is screened out for assessment needs.   CSW has added mental health resources on AVS and recommend patient follow up with PCP should she experience symptoms upon discharge.   Darolyn Rua, [place], MSW, [place] [phone]

## 2023-11-13 NOTE — Plan of Care (Signed)
  Problem: Education: Goal: Knowledge of General Education information will improve Description: Including pain rating scale, medication(s)/side effects and non-pharmacologic comfort measures Outcome: Progressing   Problem: Clinical Measurements: Goal: Diagnostic test results will improve Outcome: Progressing   Problem: Activity: Goal: Risk for activity intolerance will decrease Outcome: Progressing   

## 2023-11-15 ENCOUNTER — Emergency Department: Payer: BC Managed Care – PPO

## 2023-11-15 ENCOUNTER — Emergency Department
Admission: EM | Admit: 2023-11-15 | Discharge: 2023-11-15 | Disposition: A | Discharge status: Home / Self Care | Payer: BC Managed Care – PPO | Attending: Emergency Medicine | Admitting: Emergency Medicine

## 2023-11-15 DIAGNOSIS — R519 Headache, unspecified: Secondary | ICD-10-CM | POA: Diagnosis not present

## 2023-11-15 DIAGNOSIS — R000 Tachycardia, unspecified: Secondary | ICD-10-CM | POA: Diagnosis not present

## 2023-11-15 DIAGNOSIS — O9089 Other complications of the puerperium, not elsewhere classified: Secondary | ICD-10-CM | POA: Diagnosis present

## 2023-11-15 DIAGNOSIS — M545 Low back pain, unspecified: Secondary | ICD-10-CM | POA: Diagnosis not present

## 2023-11-15 LAB — URINALYSIS, ROUTINE W REFLEX MICROSCOPIC
Bilirubin Urine: NEGATIVE
Glucose, UA: NEGATIVE mg/dL
Ketones, ur: 20 mg/dL — AB
Nitrite: NEGATIVE
Protein, ur: NEGATIVE mg/dL
Specific Gravity, Urine: 1.005 (ref 1.005–1.030)
pH: 6 (ref 5.0–8.0)

## 2023-11-15 LAB — CBC WITH DIFFERENTIAL/PLATELET
Abs Immature Granulocytes: 0.08 10*3/uL — ABNORMAL HIGH (ref 0.00–0.07)
Basophils Absolute: 0 10*3/uL (ref 0.0–0.1)
Basophils Relative: 0 %
Eosinophils Absolute: 0.3 10*3/uL (ref 0.0–0.5)
Eosinophils Relative: 3 %
HCT: 29.6 % — ABNORMAL LOW (ref 36.0–46.0)
Hemoglobin: 9.5 g/dL — ABNORMAL LOW (ref 12.0–15.0)
Immature Granulocytes: 1 %
Lymphocytes Relative: 22 %
Lymphs Abs: 1.8 10*3/uL (ref 0.7–4.0)
MCH: 30.7 pg (ref 26.0–34.0)
MCHC: 32.1 g/dL (ref 30.0–36.0)
MCV: 95.8 fL (ref 80.0–100.0)
Monocytes Absolute: 0.5 10*3/uL (ref 0.1–1.0)
Monocytes Relative: 6 %
Neutro Abs: 5.6 10*3/uL (ref 1.7–7.7)
Neutrophils Relative %: 68 %
Platelets: 234 10*3/uL (ref 150–400)
RBC: 3.09 MIL/uL — ABNORMAL LOW (ref 3.87–5.11)
RDW: 14.8 % (ref 11.5–15.5)
WBC: 8.2 10*3/uL (ref 4.0–10.5)
nRBC: 0 % (ref 0.0–0.2)

## 2023-11-15 LAB — COMPREHENSIVE METABOLIC PANEL
ALT: 21 U/L (ref 0–44)
AST: 26 U/L (ref 15–41)
Albumin: 3 g/dL — ABNORMAL LOW (ref 3.5–5.0)
Alkaline Phosphatase: 69 U/L (ref 38–126)
Anion gap: 12 (ref 5–15)
BUN: 9 mg/dL (ref 6–20)
CO2: 21 mmol/L — ABNORMAL LOW (ref 22–32)
Calcium: 8.4 mg/dL — ABNORMAL LOW (ref 8.9–10.3)
Chloride: 108 mmol/L (ref 98–111)
Creatinine, Ser: 0.56 mg/dL (ref 0.44–1.00)
GFR, Estimated: >60 mL/min (ref >60)
Glucose, Bld: 81 mg/dL (ref 70–99)
Potassium: 3.3 mmol/L — ABNORMAL LOW (ref 3.5–5.1)
Sodium: 141 mmol/L (ref 135–145)
Total Bilirubin: 0.7 mg/dL (ref <1.2)
Total Protein: 6.7 g/dL (ref 6.5–8.1)

## 2023-11-15 MED ORDER — SODIUM CHLORIDE 0.9 % IV BOLUS
1000.0000 mL | Freq: Once | INTRAVENOUS | Status: AC
Start: 2023-11-15 — End: 2023-11-15
  Administered 2023-11-15: 1000 mL via INTRAVENOUS

## 2023-11-15 MED ORDER — OXYCODONE-ACETAMINOPHEN 5-325 MG PO TABS: 1.0000 | ORAL_TABLET | Freq: Four times a day (QID) | ORAL | 0 refills | Status: DC | PRN

## 2023-11-15 MED ORDER — SODIUM CHLORIDE 0.9 % IV SOLN
500.0000 mg | Freq: Once | INTRAVENOUS | Status: DC
  Filled 2023-11-15: qty 500

## 2023-11-15 MED ORDER — PROCHLORPERAZINE EDISYLATE 10 MG/2ML IJ SOLN
10.0000 mg | Freq: Once | INTRAMUSCULAR | Status: AC
Start: 2023-11-15 — End: 2023-11-15
  Administered 2023-11-15: 10 mg via INTRAVENOUS
  Filled 2023-11-15: qty 2

## 2023-11-15 MED ORDER — MAGNESIUM SULFATE 2 GM/50ML IV SOLN: 2.0000 g | Freq: Once | INTRAVENOUS | Status: DC

## 2023-11-15 MED ORDER — OXYCODONE-ACETAMINOPHEN 5-325 MG PO TABS
1.0000 | ORAL_TABLET | ORAL | Status: AC
  Administered 2023-11-15: 1 via ORAL
  Filled 2023-11-15: qty 1

## 2023-11-15 MED ORDER — IOHEXOL 350 MG/ML SOLN
75.0000 mL | Freq: Once | INTRAVENOUS | Status: AC | PRN
  Administered 2023-11-15: 75 mL via INTRAVENOUS

## 2023-11-15 NOTE — ED Notes (Signed)
EDP at bedside. Pt is 4d pp after vaginal birth with difficult epidural. Complains of increasing HA and "my whole spine" back pain since after the birth. HA is 10/10, pounding and throbbing. Pt denies vision changes, RUQ pain, vomiting and oliguria. Pt endorses nausea.

## 2023-11-15 NOTE — ED Provider Notes (Signed)
[facility] Provider Note    Event Date/Time   First MD Initiated Contact with Patient 11/15/23 1840     (approximate)   History   Headache   HPI  Rachel Ortega is a 27 y.o. female presents to the emergency department today because of concerns for severe headache.  The patient had a vaginal delivery 4 days ago.  She states it was complicated by blood loss.  Additionally she states that she had an epidural however took multiple times.  She says that since delivery she has had a mild headache although it is gradually gotten worse.  Patient denies history of migraines.  States that she has had epidural in the past without complications.   Physical Exam   Triage Vital Signs: ED Triage Vitals [11/15/23 1755]  Encounter Vitals Group     BP (!) 145/85     Systolic BP Percentile      Diastolic BP Percentile      Pulse Rate (!) 108     Resp (!) 22     Temp 98.2 F (36.8 C)     Temp Source Oral     SpO2 100 %     Weight      Height      Head Circumference      Peak Flow      Pain Score      Pain Loc      Pain Education      Exclude from Growth Chart     Most recent vital signs: Vitals:   11/15/23 1755  BP: (!) 145/85  Pulse: (!) 108  Resp: (!) 22  Temp: 98.2 F (36.8 C)  SpO2: 100%    General: Awake, alert, oriented. CV:  Good peripheral perfusion. Tachycardia. Resp:  Normal effort. Tachypnea. Abd:  No distention.  Skin:  Bruising and multiple small puncture wounds to lower back  ED Results / Procedures / Treatments   Labs (all labs ordered are listed, but only abnormal results are displayed) Labs Reviewed  CBC WITH DIFFERENTIAL/PLATELET - Abnormal; Notable for the following components:      Result Value   RBC 3.09 (*)    Hemoglobin 9.5 (*)    HCT 29.6 (*)    Abs Immature Granulocytes 0.08 (*)    All other components within normal limits  COMPREHENSIVE METABOLIC PANEL - Abnormal; Notable for the following components:   Potassium  3.3 (*)    CO2 21 (*)    Calcium 8.4 (*)    Albumin 3.0 (*)    All other components within normal limits  URINALYSIS, ROUTINE W REFLEX MICROSCOPIC - Abnormal; Notable for the following components:   Color, Urine STRAW (*)    APPearance CLEAR (*)    Hgb urine dipstick LARGE (*)    Ketones, ur 20 (*)    Leukocytes,Ua TRACE (*)    Bacteria, UA RARE (*)    All other components within normal limits     EKG  None   RADIOLOGY I independently interpreted and visualized the CT venogram. My interpretation: no bleed Radiology interpretation:  IMPRESSION:  1. No evidence of dural venous sinus thrombosis.  2. No acute intracranial process.      PROCEDURES:  Critical Care performed: No    MEDICATIONS ORDERED IN ED: Medications - No data to display   IMPRESSION / MDM / ASSESSMENT AND PLAN / ED COURSE  I reviewed the triage vital signs and the nursing notes.  Differential diagnosis includes, but is not limited to, spinal headache, migraine, bleed, clot  Patient's presentation is most consistent with acute presentation with potential threat to life or bodily function.  Patient presented to the emergency department today because of concerns for bad headache.  Patient did recently have vaginal delivery and had epidural.  On exam it is clear that the epidural was difficult given multiple puncture sites.  Did obtain a CT scan of the patient's brain which did not show any venous clot or bleed.  Of note patient was initially found to be hypertensive here.  No protein in her urine.  Subsequent blood pressure checks were normal.  I did discuss briefly with on call midwife who agreed that patient's presentation is not consistent with preeclampsia.  Patient's headache did improve after medication.  She still complained of some pain at the site of the epidural.  Will plan on discharging with brief course of pain medication.     FINAL CLINICAL IMPRESSION(S) / ED  DIAGNOSES   Final diagnoses:  Bad headache  Acute midline low back pain without sciatica     Note:  This document was prepared using Dragon voice recognition software and may include unintentional dictation errors.    Phineas Semen, MD 11/15/23 2229

## 2023-11-15 NOTE — ED Triage Notes (Signed)
Pt reports that she gave birth four days ago and has been having back pain and headaches since. Approximately 3 hours ago her headache intensified greatly on the R side. Pt without unilateral weakness noted during triage. Pt G2P2 states that during delivery she had issues with epidural and vaginal delivery that resulted in 2 L blood loss.

## 2023-11-15 NOTE — Discharge Instructions (Signed)
Please seek medical attention for any high fevers, chest pain, shortness of breath, change in behavior, persistent vomiting, bloody stool or any other new or concerning symptoms.  

## 2023-11-18 ENCOUNTER — Institutional Professional Consult (permissible substitution): Payer: BC Managed Care – PPO

## 2023-11-18 ENCOUNTER — Encounter: Payer: Self-pay | Admitting: Pain Medicine

## 2023-11-18 ENCOUNTER — Ambulatory Visit: Payer: BC Managed Care – PPO | Attending: Pain Medicine | Admitting: Pain Medicine

## 2023-11-18 ENCOUNTER — Ambulatory Visit
Admission: RE | Admit: 2023-11-18 | Discharge: 2023-11-18 | Disposition: A | Discharge status: Home / Self Care | Payer: BC Managed Care – PPO | Source: Ambulatory Visit | Attending: Pain Medicine | Admitting: Pain Medicine

## 2023-11-18 VITALS — BP 145/76 | HR 48 | Temp 97.3°F | Resp 16 | Ht 66.0 in | Wt 216.0 lb

## 2023-11-18 DIAGNOSIS — F419 Anxiety disorder, unspecified: Secondary | ICD-10-CM | POA: Diagnosis present

## 2023-11-18 DIAGNOSIS — O894 Spinal and epidural anesthesia-induced headache during the puerperium: Secondary | ICD-10-CM

## 2023-11-18 DIAGNOSIS — G971 Other reaction to spinal and lumbar puncture: Secondary | ICD-10-CM

## 2023-11-18 MED ORDER — CEFAZOLIN SODIUM-DEXTROSE 1-4 GM/50ML-% IV SOLN
1.0000 g | Freq: Once | INTRAVENOUS | Status: DC
Start: 2023-11-18 — End: 2023-11-18

## 2023-11-18 MED ORDER — LACTATED RINGERS IV SOLN: Freq: Once | INTRAVENOUS | Status: AC

## 2023-11-18 MED ORDER — MIDAZOLAM HCL 2 MG/2ML IJ SOLN
0.5000 mg | Freq: Once | INTRAMUSCULAR | Status: DC
  Administered 2023-11-18: 2 mg via INTRAVENOUS

## 2023-11-18 MED ORDER — MIDAZOLAM HCL 2 MG/2ML IJ SOLN
INTRAMUSCULAR | Status: AC
Start: 2023-11-18 — End: ?
  Filled 2023-11-18: qty 2

## 2023-11-18 MED ORDER — PENTAFLUOROPROP-TETRAFLUOROETH EX AERO
INHALATION_SPRAY | Freq: Once | CUTANEOUS | Status: AC
  Administered 2023-11-18: 30 via TOPICAL

## 2023-11-18 MED ORDER — IOHEXOL 180 MG/ML  SOLN
10.0000 mL | Freq: Once | INTRAMUSCULAR | Status: AC
  Administered 2023-11-18: 10 mL via EPIDURAL
  Filled 2023-11-18: qty 20

## 2023-11-18 MED ORDER — LIDOCAINE HCL 2 % IJ SOLN
20.0000 mL | Freq: Once | INTRAMUSCULAR | Status: AC
  Administered 2023-11-18: 400 mg
  Filled 2023-11-18: qty 20

## 2023-11-18 NOTE — Patient Instructions (Signed)
______________________________________________________________________    Procedure instructions  Stop blood-thinners  Do not eat or drink fluids (other than water) for 6 hours before your procedure  No water for 2 hours before your procedure  Take your blood pressure medicine with a sip of water  Arrive 30 minutes before your appointment  If sedation is planned, bring suitable driver. Pennie Banter, Benedetto Goad, & public transportation are NOT APPROVED)  Carefully read the "Preparing for your procedure" detailed instructions  If you have questions call us at [phone]  ______________________________________________________________________      ______________________________________________________________________    Preparing for your procedure  Appointments: If you think you may not be able to keep your appointment, call 24-48 hours in advance to cancel. We need time to make it available to others.  During your procedure appointment there will be: No Prescription Refills. No disability issues to discussed. No medication changes or discussions.  Instructions: Food intake: Avoid eating anything solid for at least 8 hours prior to your procedure. Clear liquid intake: You may take clear liquids such as water up to 2 hours prior to your procedure. (No carbonated drinks. No soda.) Transportation: Unless otherwise stated by your physician, bring a driver. (Driver cannot be a Market researcher, Pharmacist, community, or any other form of public transportation.) Morning Medicines: Except for blood thinners, take all of your other morning medications with a sip of water. Make sure to take your heart and blood pressure medicines. If your blood pressure's lower number is above 100, the case will be rescheduled. Blood thinners: Make sure to stop your blood thinners as instructed.  If you take a blood thinner, but were not instructed to stop it, call our office [phone] and ask to talk to a nurse. Not stopping a blood  thinner prior to certain procedures could lead to serious complications. Diabetics on insulin: Notify the staff so that you can be scheduled 1st case in the morning. If your diabetes requires high dose insulin, take only  of your normal insulin dose the morning of the procedure and notify the staff that you have done so. Preventing infections: Shower with an antibacterial soap the morning of your procedure.  Build-up your immune system: Take 1000 mg of Vitamin C with every meal (3 times a day) the day prior to your procedure. Antibiotics: Inform the nursing staff if you are taking any antibiotics or if you have any conditions that may require antibiotics prior to procedures. (Example: recent joint implants)   Pregnancy: If you are pregnant make sure to notify the nursing staff. Not doing so may result in injury to the fetus, including death.  Sickness: If you have a cold, fever, or any active infections, call and cancel or reschedule your procedure. Receiving steroids while having an infection may result in complications. Arrival: You must be in the facility at least 30 minutes prior to your scheduled procedure. Tardiness: Your scheduled time is also the cutoff time. If you do not arrive at least 15 minutes prior to your procedure, you will be rescheduled.  Children: Do not bring any children with you. Make arrangements to keep them home. Dress appropriately: There is always a possibility that your clothing may get soiled. Avoid long dresses. Valuables: Do not bring any jewelry or valuables.  Reasons to call and reschedule or cancel your procedure: (Following these recommendations will minimize the risk of a serious complication.) Surgeries: Avoid having procedures within 2 weeks of any surgery. (Avoid for 2 weeks before or after any surgery). Flu Shots:  Avoid having procedures within 2 weeks of a flu shots or . (Avoid for 2 weeks before or after immunizations). Barium: Avoid having a procedure  within 7-10 days after having had a radiological study involving the use of radiological contrast. (Myelograms, Barium swallow or enema study). Heart attacks: Avoid any elective procedures or surgeries for the initial 6 months after a "Myocardial Infarction" (Heart Attack). Blood thinners: It is imperative that you stop these medications before procedures. Let us know if you if you take any blood thinner.  Infection: Avoid procedures during or within two weeks of an infection (including chest colds or gastrointestinal problems). Symptoms associated with infections include: Localized redness, fever, chills, night sweats or profuse sweating, burning sensation when voiding, cough, congestion, stuffiness, runny nose, sore throat, diarrhea, nausea, vomiting, cold or Flu symptoms, recent or current infections. It is specially important if the infection is over the area that we intend to treat. Heart and lung problems: Symptoms that may suggest an active cardiopulmonary problem include: cough, chest pain, breathing difficulties or shortness of breath, dizziness, ankle swelling, uncontrolled high or unusually low blood pressure, and/or palpitations. If you are experiencing any of these symptoms, cancel your procedure and contact your primary care physician for an evaluation.  Remember:  Regular Business hours are:  Monday to Thursday 8:00 AM to 4:00 PM  Provider's Schedule: Delano Metz, MD:  Procedure days: Tuesday and Thursday 7:30 AM to 4:00 PM  Edward Jolly, MD:  Procedure days: Monday and Wednesday 7:30 AM to 4:00 PM Last  Updated: 08/12/2023 ______________________________________________________________________      ______________________________________________________________________    General Risks and Possible Complications  Patient Responsibilities: It is important that you read this as it is part of your informed consent. It is our duty to inform you of the risks and possible  complications associated with treatments offered to you. It is your responsibility as a patient to read this and to ask questions about anything that is not clear or that you believe was not covered in this document.  Patient's Rights: You have the right to refuse treatment. You also have the right to change your mind, even after initially having agreed to have the treatment done. However, under this last option, if you wait until the last second to change your mind, you may be charged for the materials used up to that point.  Introduction: Medicine is not an Visual merchandiser. Everything in Medicine, including the lack of treatment(s), carries the potential for danger, harm, or loss (which is by definition: Risk). In Medicine, a complication is a secondary problem, condition, or disease that can aggravate an already existing one. All treatments carry the risk of possible complications. The fact that a side effects or complications occurs, does not imply that the treatment was conducted incorrectly. It must be clearly understood that these can happen even when everything is done following the highest safety standards.  No treatment: You can choose not to proceed with the proposed treatment alternative. The "PRO(s)" would include: avoiding the risk of complications associated with the therapy. The "CON(s)" would include: not getting any of the treatment benefits. These benefits fall under one of three categories: diagnostic; therapeutic; and/or palliative. Diagnostic benefits include: getting information which can ultimately lead to improvement of the disease or symptom(s). Therapeutic benefits are those associated with the successful treatment of the disease. Finally, palliative benefits are those related to the decrease of the primary symptoms, without necessarily curing the condition (example: decreasing the pain from a flare-up  of a chronic condition, such as incurable terminal cancer).  General Risks and  Complications: These are associated to most interventional treatments. They can occur alone, or in combination. They fall under one of the following six (6) categories: no benefit or worsening of symptoms; bleeding; infection; nerve damage; allergic reactions; and/or death. No benefits or worsening of symptoms: In Medicine there are no guarantees, only probabilities. No healthcare provider can ever guarantee that a medical treatment will work, they can only state the probability that it may. Furthermore, there is always the possibility that the condition may worsen, either directly, or indirectly, as a consequence of the treatment. Bleeding: This is more common if the patient is taking a blood thinner, either prescription or over the counter (example: Goody Powders, Fish oil, Aspirin, Garlic, etc.), or if suffering a condition associated with impaired coagulation (example: Hemophilia, cirrhosis of the liver, low platelet counts, etc.). However, even if you do not have one on these, it can still happen. If you have any of these conditions, or take one of these drugs, make sure to notify your treating physician. Infection: This is more common in patients with a compromised immune system, either due to disease (example: diabetes, cancer, human immunodeficiency virus [HIV], etc.), or due to medications or treatments (example: therapies used to treat cancer and rheumatological diseases). However, even if you do not have one on these, it can still happen. If you have any of these conditions, or take one of these drugs, make sure to notify your treating physician. Nerve Damage: This is more common when the treatment is an invasive one, but it can also happen with the use of medications, such as those used in the treatment of cancer. The damage can occur to small secondary nerves, or to large primary ones, such as those in the spinal cord and brain. This damage may be temporary or permanent and it may lead to  impairments that can range from temporary numbness to permanent paralysis and/or brain death. Allergic Reactions: Any time a substance or material comes in contact with our body, there is the possibility of an allergic reaction. These can range from a mild skin rash (contact dermatitis) to a severe systemic reaction (anaphylactic reaction), which can result in death. Death: In general, any medical intervention can result in death, most of the time due to an unforeseen complication. ______________________________________________________________________      ______________________________________________________________________    Post-Procedure Discharge Instructions  Instructions: Apply ice:  Purpose: This will minimize any swelling and discomfort after procedure.  When: Day of procedure, as soon as you get home. How: Fill a plastic sandwich bag with crushed ice. Cover it with a small towel and apply to injection site. How long: (15 min on, 15 min off) Apply for 15 minutes then remove x 15 minutes.  Repeat sequence on day of procedure, until you go to bed. Apply heat:  Purpose: To treat any soreness and discomfort from the procedure. When: Starting the next day after the procedure. How: Apply heat to procedure site starting the day following the procedure. How long: May continue to repeat daily, until discomfort goes away. Food intake: Start with clear liquids (like water) and advance to regular food, as tolerated.  Physical activities: Keep activities to a minimum for the first 8 hours after the procedure. After that, then as tolerated. Driving: If you have received any sedation, be responsible and do not drive. You are not allowed to drive for 24 hours after having sedation. Blood thinner: (  Applies only to those taking blood thinners) You may restart your blood thinner 6 hours after your procedure. Insulin: (Applies only to Diabetic patients taking insulin) As soon as you can eat, you may  resume your normal dosing schedule. Infection prevention: Keep procedure site clean and dry. Shower daily and clean area with soap and water. Post-procedure Pain Diary: Extremely important that this be done correctly and accurately. Recorded information will be used to determine the next step in treatment. For the purpose of accuracy, follow these rules: Evaluate only the area treated. Do not report or include pain from an untreated area. For the purpose of this evaluation, ignore all other areas of pain, except for the treated area. After your procedure, avoid taking a long nap and attempting to complete the pain diary after you wake up. Instead, set your alarm clock to go off every hour, on the hour, for the initial 8 hours after the procedure. Document the duration of the numbing medicine, and the relief you are getting from it. Do not go to sleep and attempt to complete it later. It will not be accurate. If you received sedation, it is likely that you were given a medication that may cause amnesia. Because of this, completing the diary at a later time may cause the information to be inaccurate. This information is needed to plan your care. Follow-up appointment: Keep your post-procedure follow-up evaluation appointment after the procedure (usually 2 weeks for most procedures, 6 weeks for radiofrequencies). DO NOT FORGET to bring you pain diary with you.   Expect: (What should I expect to see with my procedure?) From numbing medicine (AKA: Local Anesthetics): Numbness or decrease in pain. You may also experience some weakness, which if present, could last for the duration of the local anesthetic. Onset: Full effect within 15 minutes of injected. Duration: It will depend on the type of local anesthetic used. On the average, 1 to 8 hours.  From steroids (none used) From procedure: Some discomfort is to be expected once the numbing medicine wears off. This should be minimal if ice and heat are applied  as instructed.  Call if: (When should I call?) You experience numbness and weakness that gets worse with time, as opposed to wearing off. New onset bowel or bladder incontinence. (Applies only to procedures done in the spine)  Emergency Numbers: Durning business hours (Monday - Thursday, 8:00 AM - 4:00 PM) (Friday, 9:00 AM - 12:00 Noon): [phone] After hours: [phone] NOTE: If you are having a problem and are unable connect with, or to talk to a provider, then go to your nearest urgent care or emergency department. If the problem is serious and urgent, please call 911. ______________________________________________________________________

## 2023-11-18 NOTE — Progress Notes (Signed)
Safety precautions to be maintained throughout the outpatient stay will include: orient to surroundings, keep bed in low position, maintain call bell within reach at all times, provide assistance with transfer out of bed and ambulation.  

## 2023-11-18 NOTE — Progress Notes (Signed)
PROVIDER NOTE: Interpretation of information contained herein should be left to medically-trained personnel. Specific patient instructions are provided elsewhere under "Patient Instructions" section of medical record. This document was created in part using STT-dictation technology, any transcriptional errors that may result from this process are unintentional.  Patient: Rachel Ortega Type: Established DOB: 1996/12/02 MRN: [id number] PCP: Noralyn Pick, NP  Service: Procedure DOS: 11/18/2023 Setting: Ambulatory Location: Ambulatory outpatient facility Delivery: Face-to-face Provider: Oswaldo Done, MD Specialty: Interventional Pain Management Specialty designation: 09 Location: Outpatient facility Ref. Prov.: Noralyn Pick, NP       Interventional Therapy    Primary Reason for Visit: Interventional Pain Management Treatment. CC: Other (Spinal headache, frontal pain. Better when lying. )   Interventional Treatment          Procedure: Epidural blood patch Laterality: Midline Paramedial  Level: Lumbosacral   No./Series: Not applicable  Type: Epidural injection Purpose: Therapeutic  Region: Posterior Lumbosacral  Target Area: Posterior epidural space Approach: Percutaneous        Analgesic Technique:  Local Anesthesia only + Versed 2 mg  Type: Local Anesthesia Local Anesthetic: Lidocaine 1-2% Sedation: None  Indication(s):  Analgesia Route: Infiltration ([place]/IM) IV Access: N/A         Position / Prep / Materials:  Position: Prone   Prep solution: ChloraPrep (2% chlorhexidine gluconate and 70% isopropyl alcohol)  Materials:       Tray: Epidural      Needle(s):  Type:  Epidural   Gauge (G):  17   Length:  3.5   Qty:  1    Prep Area: Entire  Lower  Lumbar  Area          1. Postdural puncture headache   2. Postpartum spinal headache   3. Anxiety due to invasive procedure    NAS-11 Pain score:   Pre-procedure: 10-Worst pain ever/10   Post-procedure: 0-No  pain (headache relieved at present.)/10    H&P (Pre-op Assessment):  Rachel Ortega is a 27 y.o. (year old), female patient, seen today for interventional treatment. She  has a past surgical history that includes Tonsillectomy (Bilateral); Wisdom tooth extraction (Bilateral); and Tonsillectomy. Rachel Ortega has a current medication list which includes the following prescription(s): acetaminophen, buspirone, doxylamine (sleep), epinephrine, ferrous sulfate, fexofenadine, ibuprofen, oxycodone-acetaminophen, pediatric multivit-minerals, valacyclovir, and venlafaxine, and the following Facility-Administered Medications: cefazolin and midazolam. Her primarily concern today is the Other (Spinal headache, frontal pain. Better when lying. )  Initial Vital Signs:  Pulse/HCG Rate: 61  Temp: (!) 97.3 F (36.3 C) Resp: 16 BP: 133/63 SpO2: 98 %  BMI: Estimated body mass index is 34.86 kg/m as calculated from the following:   Height as of this encounter: 5\' 6"  (1.676 m).   Weight as of this encounter: 216 lb (98 kg).  Risk Assessment: Allergies: Reviewed. She is allergic to codeine, shrimp (diagnostic), hydrocod poli-chlorphe poli er, and sulfa antibiotics.  Allergy Precautions: None required Coagulopathies: Reviewed. None identified.  Blood-thinner therapy: None at this time Active Infection(s): Reviewed. None identified. Rachel Ortega is afebrile  Site Confirmation: Rachel Ortega was asked to confirm the procedure and laterality before marking the site Procedure checklist: Completed Consent: Before the procedure and under the influence of no sedative(s), amnesic(s), or anxiolytics, the patient was informed of the treatment options, risks and possible complications. To fulfill our ethical and legal obligations, as recommended by the American Medical Association's Code of Ethics, I have informed the patient of my clinical impression; the nature and purpose  of the treatment or procedure; the risks, benefits, and  possible complications of the intervention; the alternatives, including doing nothing; the risk(s) and benefit(s) of the alternative treatment(s) or procedure(s); and the risk(s) and benefit(s) of doing nothing. The patient was provided information about the general risks and possible complications associated with the procedure. These may include, but are not limited to: failure to achieve desired goals, infection, bleeding, organ or nerve damage, allergic reactions, paralysis, and death. In addition, the patient was informed of those risks and complications associated to Spine-related procedures, such as failure to decrease pain; infection (i.e.: Meningitis, epidural or intraspinal abscess); bleeding (i.e.: epidural hematoma, subarachnoid hemorrhage, or any other type of intraspinal or peri-dural bleeding); organ or nerve damage (i.e.: Any type of peripheral nerve, nerve root, or spinal cord injury) with subsequent damage to sensory, motor, and/or autonomic systems, resulting in permanent pain, numbness, and/or weakness of one or several areas of the body; allergic reactions; (i.e.: anaphylactic reaction); and/or death. Furthermore, the patient was informed of those risks and complications associated with the medications. These include, but are not limited to: allergic reactions (i.e.: anaphylactic or anaphylactoid reaction(s)); adrenal axis suppression; blood sugar elevation that in diabetics may result in ketoacidosis or comma; water retention that in patients with history of congestive heart failure may result in shortness of breath, pulmonary edema, and decompensation with resultant heart failure; weight gain; swelling or edema; medication-induced neural toxicity; particulate matter embolism and blood vessel occlusion with resultant organ, and/or nervous system infarction; and/or aseptic necrosis of one or more joints. Finally, the patient was informed that Medicine is not an exact science; therefore, there  is also the possibility of unforeseen or unpredictable risks and/or possible complications that may result in a catastrophic outcome. The patient indicated having understood very clearly. We have given the patient no guarantees and we have made no promises. Enough time was given to the patient to ask questions, all of which were answered to the patient's satisfaction. Ms. Clabo has indicated that she wanted to continue with the procedure. Attestation: I, the ordering provider, attest that I have discussed with the patient the benefits, risks, side-effects, alternatives, likelihood of achieving goals, and potential problems during recovery for the procedure that I have provided informed consent. Date  Time: 11/18/2023  1:32 PM   Pre-Procedure Preparation:  Monitoring: As per clinic protocol. Respiration, ETCO2, SpO2, BP, heart rate and rhythm monitor placed and checked for adequate function Safety Precautions: Patient was assessed for positional comfort and pressure points before starting the procedure. Time-out: I initiated and conducted the "Time-out" before starting the procedure, as per protocol. The patient was asked to participate by confirming the accuracy of the "Time Out" information. Verification of the correct person, site, and procedure were performed and confirmed by me, the nursing staff, and the patient. "Time-out" conducted as per Joint Commission's Universal Protocol (UP.01.01.01). Time: 1506 Start Time: 1506 hrs.  Description of Procedure:        vs Description of Procedure:       Area Prepped: Entire     Region Prepping solution: ChloraPrep (2% chlorhexidine gluconate and 70% isopropyl alcohol) Safety Precautions: Aspiration looking for blood return was conducted prior to all injections. At no point did we inject any substances, as a needle was being advanced. No attempts were made at seeking any paresthesias. Safe injection practices and needle disposal techniques used.  Medications properly checked for expiration dates. SDV (single dose vial) medications used. Technical process: Protocol guidelines were followed. The  patient was positioned over the fluoroscopy table. The target area was identified, using fluoroscopy, and the area prepped in the usual manner. Skin (epidermis, dermis, and hypodermis), as well as deeper tissues (fat, connective tissue and muscle) were infiltrated with a short acting local anesthetic (PF-Lidocaine 2.0%) (1.63mL/level), using a 10cc syringe with a 25G needle (unused portion was discarded).  Appropriate amount of time allowed to pass for local anesthetics to take effect.    Procedural Technique Safety Precautions: Aspiration looking for blood return was conducted prior to all injections. At no point did we inject any substances, as a needle was being advanced. No attempts were made at seeking any paresthesias. Safe injection practices and needle disposal techniques used. Medications properly checked for expiration dates. SDV (single dose vial) medications used. Description of the Procedure: Protocol guidelines were followed. The patient was assisted into a comfortable position. The target area was identified and the area prepped in the usual manner. Skin & deeper tissues infiltrated with local anesthetic. Appropriate amount of time allowed to pass for local anesthetics to take effect. The procedure needles were then advanced to the target area. Proper needle placement secured. Negative aspiration confirmed. Solution injected in intermittent fashion, asking for systemic symptoms every 0.5cc of injectate. The needles were then removed and the area cleansed, making sure to leave some of the prepping solution back to take advantage of its long term bactericidal properties.vsDescription of the Procedure: Protocol guidelines were followed. The patient was placed in position over the procedure table. Patient assisted into a comfortable position. Pressure  points checked. The target area was identified and the area prepped in the usual manner. Skin & deeper tissues infiltrated with local anesthetic. Appropriate amount of time allowed to pass for local anesthetics to take effect. The procedure needles were then advanced to the target area. Proper needle placement secured. Negative aspiration confirmed. Solution injected in intermittent fashion, asking for systemic symptoms every 0.5cc of injectate. The needles were then removed and the area cleansed, making sure to leave some of the prepping solution back to take advantage of its long term bactericidal properties.  Vitals:   11/18/23 1548 11/18/23 1551 11/18/23 1559 11/18/23 1610  BP: (!) 195/71 (!) 140/71 (!) 145/76 (!) 145/76  Pulse: (!) 50 (!) 59 (!) 58 (!) 48  Resp: 16 16 16 16   Temp:      TempSrc:      SpO2: 99% 97% 100% 99%  Weight:      Height:       Start Time: 1506 hrs. End Time: 1518 hrs.  Materials:  Needle(s) Type: Tuohy-style epidural needle Gauge: 17G Length: 3.5-in Medication(s): Please see orders for medications and dosing details.  Type of Imaging Technique: Fluoroscopy Guidance (Spinal) Indication(s): Fluoroscopy guidance for needle placement to enhance accuracy in procedures requiring precise needle localization for targeted delivery of medication in or near specific anatomical locations not easily accessible without such real-time imaging assistance. Exposure Time: Please see nurses notes. Contrast: Before injecting any contrast, we confirmed that the patient did not have an allergy to iodine, shellfish, or radiological contrast. Once satisfactory needle placement was completed at the desired level, radiological contrast was injected. Contrast injected under live fluoroscopy. No contrast complications. See chart for type and volume of contrast used. Fluoroscopic Guidance: I was personally present during the use of fluoroscopy. "Tunnel Vision Technique" used to obtain the  best possible view of the target area. Parallax error corrected before commencing the procedure. "Direction-depth-direction" technique used to introduce the needle  under continuous pulsed fluoroscopy. Once target was reached, antero-posterior, oblique, and lateral fluoroscopic projection used confirm needle placement in all planes. Images permanently stored in EMR. Ultrasound Guidance: N/A Interpretation: I personally interpreted the imaging intraoperatively. Adequate needle placement confirmed in multiple planes. Appropriate spread of contrast into desired area was observed. No evidence of afferent or efferent intravascular uptake. No intrathecal or subarachnoid spread observed. Permanent images saved into the patient's record.  Post-operative Assessment:  Post-procedure Vital Signs:  Pulse/HCG Rate: (!) 48  Temp: (!) 97.3 F (36.3 C) Resp: 16 BP: (!) 145/76 SpO2: 99 %  EBL: None  Complications: No immediate post-treatment complications observed by team, or reported by patient.  Note: The patient tolerated the entire procedure well. A repeat set of vitals were taken after the procedure and the patient was kept under observation following institutional policy, for this type of procedure. Post-procedural neurological assessment was performed, showing return to baseline, prior to discharge. The patient was provided with post-procedure discharge instructions, including a section on how to identify potential problems. Should any problems arise concerning this procedure, the patient was given instructions to immediately contact us, at any time, without hesitation. In any case, we plan to contact the patient by telephone for a follow-up status report regarding this interventional procedure.  Comments:  No additional relevant information.  Plan of Care (POC)  Orders:  Orders Placed This Encounter  Procedures   EPIDURAL BLOOD PATCH    Scheduling Instructions:     Side: Midline     Level: TBD      Sedation: Patient's choice.     Timeframe: Today    Order Specific Question:   Where will this procedure be performed?    Answer:   [facility]   DG [facility] C-ARM 1-60 MIN NO REPORT    Intraoperative interpretation by procedural physician at [facility].    Standing Status:   Standing    Number of Occurrences:   1    Order Specific Question:   Reason for exam:    Answer:   Assistance in needle guidance and placement for procedures requiring needle placement in or near specific anatomical locations not easily accessible without such assistance.   Informed Consent Details: Physician/Practitioner Attestation; Transcribe to consent form and obtain patient signature    Nursing Order: Transcribe to consent form and obtain patient signature. Note: Always confirm laterality of pain with Ms. Weyer, before procedure.    Order Specific Question:   Physician/Practitioner attestation of informed consent for procedure/surgical case    Answer:   I, the physician/practitioner, attest that I have discussed with the patient the benefits, risks, side effects, alternatives, likelihood of achieving goals and potential problems during recovery for the procedure that I have provided informed consent.    Order Specific Question:   Procedure    Answer:   Epidural blood patch    Order Specific Question:   Physician/Practitioner performing the procedure    Answer:   Tinea Nobile A. Laban Emperor, MD    Order Specific Question:   Indication/Reason    Answer:   Postdural puncture headache   Provide equipment / supplies at bedside    Procedural tray: Epidural Tray (Disposable  single use) Skin infiltration needle: Regular 1.5-in, 25-G, (x1) Block needle size: Regular standard Catheter: No catheter required    Standing Status:   Standing    Number of Occurrences:   1    Order Specific Question:   Specify    Answer:  Epidural Tray   Chronic Opioid Analgesic:  Oxycodone/APAP 5/325, 1 tab p.o. 4 times  daily (# 12) (last filled on 11/16/2023) MME/day: 30 mg/day   Medications ordered for procedure: Meds ordered this encounter  Medications   iohexol (OMNIPAQUE) 180 MG/ML injection 10 mL    Must be Myelogram-compatible. If not available, you may substitute with a water-soluble, non-ionic, hypoallergenic, myelogram-compatible radiological contrast medium.   lidocaine (XYLOCAINE) 2 % (with pres) injection 400 mg   pentafluoroprop-tetrafluoroeth (GEBAUERS) aerosol   lactated ringers infusion   ceFAZolin (ANCEF) IVPB 1 g/50 mL premix    Order Specific Question:   Antibiotic Indication:    Answer:   Surgical Prophylaxis    Order Specific Question:   Other Indication:    Answer:   Procedure Prophylaxis   midazolam (VERSED) injection 0.5-2 mg    Make sure Flumazenil is available in the pyxis when using this medication. If oversedation occurs, administer 0.2 mg IV over 15 sec. If after 45 sec no response, administer 0.2 mg again over 1 min; may repeat at 1 min intervals; not to exceed 4 doses (1 mg)   Medications administered: We administered iohexol, lidocaine, pentafluoroprop-tetrafluoroeth, and lactated ringers.  See the medical record for exact dosing, route, and time of administration.  Follow-up plan:   Return in about 2 weeks (around 12/02/2023) for (Face2F), (PPE).        Interventional Therapies  Risk Factors  Considerations  Medical Comorbidities:     Planned  Pending:   Epidural blood patch today (11/18/2023)   Under consideration:   Pending   Completed:   None at this time   Therapeutic  Palliative (PRN) options:   None established   Completed by other providers:   None reported      Recent Visits No visits were found meeting these conditions. Showing recent visits within past 90 days and meeting all other requirements Today's Visits Date Type Provider Dept  11/18/23 Procedure visit Delano Metz, MD [facility]  Showing today's visits and  meeting all other requirements Future Appointments Date Type Provider Dept  12/02/23 Appointment Delano Metz, MD [facility]  Showing future appointments within next 90 days and meeting all other requirements  Disposition: Discharge home  Discharge (Date  Time): 11/18/2023; 1617 hrs.   Primary Care Physician: Noralyn Pick, NP Location: [facility] Note by: Oswaldo Done, MD (TTS technology used. I apologize for any typographical errors that were not detected and corrected.) Date: 11/18/2023; Time: 6:37 PM  Disclaimer:  Medicine is not an Visual merchandiser. The only guarantee in medicine is that nothing is guaranteed. It is important to note that the decision to proceed with this intervention was based on the information collected from the patient. The Data and conclusions were drawn from the patient's questionnaire, the interview, and the physical examination. Because the information was provided in large part by the patient, it cannot be guaranteed that it has not been purposely or unconsciously manipulated. Every effort has been made to obtain as much relevant data as possible for this evaluation. It is important to note that the conclusions that lead to this procedure are derived in large part from the available data. Always take into account that the treatment will also be dependent on availability of resources and existing treatment guidelines, considered by other Pain Management Practitioners as being common knowledge and practice, at the time of the intervention. For Medico-Legal purposes, it is also important to point out that  variation in procedural techniques and pharmacological choices are the acceptable norm. The indications, contraindications, technique, and results of the above procedure should only be interpreted and judged by a Board-Certified Interventional Pain Specialist with extensive familiarity and expertise in the same exact  procedure and technique.

## 2023-11-18 NOTE — Progress Notes (Signed)
Patient: Rachel Ortega  Service Category: E/M  Provider: Oswaldo Done, MD  DOB: 14-Apr-1996  DOS: 11/18/2023  Referring Provider: Noralyn Pick, NP  MRN: [id number]  Setting: Ambulatory outpatient  PCP: Noralyn Pick, NP  Type: New Patient  Specialty: Interventional Pain Management    Location: Office  Delivery: Face-to-face     Primary Reason(s) for Visit: Encounter for initial evaluation of one or more chronic problems (new to examiner) potentially causing chronic pain, and posing a threat to normal musculoskeletal function. (Level of risk: High) CC: Other (Spinal headache, frontal pain. Better when lying. )  HPI  [name] is a 27 y.o. year old, female patient, who comes for the first time to our practice referred by Noralyn Pick, NP for our initial evaluation of her chronic pain. 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 Today she comes in for evaluation of her Other (Spinal headache, frontal pain. Better when lying. )  Pain Assessment: Location: Anterior Head Radiating: denies Onset: In the past 7 days Duration:  (no headache) Quality: Constant, Other (Comment) (feeling like head is going to explode) Severity: 10-Worst pain ever/10 (subjective, self-reported pain score)  Effect on ADL: unable to be up and about Timing:  Constant Modifying factors: laying down, medications BP: (!) 145/76  HR: (!) 48  Onset and Duration: Sudden Cause of pain:  epidural during labor  Severity: No change since onset, NAS-11 at its worse: 10/10, NAS-11 at its best: 10/10, NAS-11 now: 10/10, and NAS-11 on the average: 10/10 Timing: Morning, Afternoon, Night, Not influenced by the time of the day, During activity or exercise, After activity or exercise, and After a period of immobility Aggravating Factors: Bending, Climbing, Motion, Prolonged sitting, Prolonged standing, Squatting, Stooping , Twisting, Walking, Walking uphill, Walking downhill, and Working Alleviating Factors: Lying down and Medications Associated Problems: Dizziness, Inability to concentrate, Nausea, Sweating, Pain that wakes patient up, and Pain that does not allow patient to sleep Quality of Pain: Aching, Agonizing, Annoying, Constant, Cruel, Deep, Disabling, Distressing, Dreadful, Exhausting, Heavy, Horrible, Pressure-like, Pulsating, Sharp, Shooting, Sickening, Splitting, Stabbing, Throbbing, and Uncomfortable Previous Examinations or Tests: CT scan Previous Treatments: Narcotic medications  [name] is being evaluated for possible interventional pain management therapies for the treatment of her chronic pain.  Discussed the use of AI scribe software for clinical note transcription with the patient, who gave verbal consent to proceed.  History of Present Illness   The patient, a recent postpartum individual, presents with a severe headache that began after an epidural for childbirth. The epidural procedure was reportedly difficult, requiring multiple attempts, and was only partially effective, providing numbness in the legs but not alleviating the pain of childbirth. The headache began the night of the epidural and progressively worsened, reaching peak intensity by Saturday. The pain is described as being most severe in the frontal area, but is felt throughout  the head. The headache is positional, with relief when lying down and increased pressure upon standing.  The patient has been managing the pain  with Percocet (5mg ) and Advil, but with limited success. Accompanying symptoms include nausea and sweating. The patient denies any history of similar headaches.  In addition to the headache, the patient reports residual back soreness post-epidural and childbirth. However, there are no complaints of pain radiating down the legs, weakness, or numbness.      Historic Controlled Substance Pharmacotherapy Review  PMP and historical list of controlled substances: Oxycodone/APAP 5/325, 1 tab p.o. 4 times daily (# 12) (last filled on 11/16/2023); codeine/guaifenesin 10/100 mg per 5 mL (# 100) (last filled on 03/04/2022) Most recently prescribed opioid analgesics:   Oxycodone/APAP 5/325, 1 tab p.o. 4 times daily (# 12) (last filled on 11/16/2023) MME/day: 30 mg/day  Historical Monitoring: The patient  reports no history of drug use. List of prior UDS Testing: No results found for: "MDMA", "COCAINSCRNUR", "PCPSCRNUR", "PCPQUANT", "CANNABQUANT", "THCU", "ETH", "CBDTHCR", "D8THCCBX", "D9THCCBX" Historical Background Evaluation: [place] PMP: PDMP reviewed during this encounter. Review of the past 49-months conducted.             PMP NARX Score Report:  Narcotic: 091 Sedative: 050 Stimulant: 000 [place] Department of public safety, offender search: Engineer, mining Information) Non-contributory Risk Assessment Profile: Aberrant behavior: None observed or detected today Risk factors for fatal opioid overdose: None identified today PMP NARX Overdose Risk Score: 320 Fatal overdose hazard ratio (HR): Calculation deferred Non-fatal overdose hazard ratio (HR): Calculation deferred Risk of opioid abuse or dependence: 0.7-3.0% with doses <= 36 MME/day and 6.1-26% with doses >= 120 MME/day. Substance use disorder (SUD) risk level: See below Personal History of Substance Abuse  (SUD-Substance use disorder):  Alcohol:    Illegal Drugs:    Rx Drugs:    ORT Risk Level calculation:    ORT Scoring interpretation table:  Score <3 = Low Risk for SUD  Score between 4-7 = Moderate Risk for SUD  Score >8 = High Risk for Opioid Abuse   PHQ-2 Depression Scale:  Total score:    PHQ-2 Scoring interpretation table: (Score and probability of major depressive disorder)  Score 0 = No depression  Score 1 = 15.4% Probability  Score 2 = 21.1% Probability  Score 3 = 38.4% Probability  Score 4 = 45.5% Probability  Score 5 = 56.4% Probability  Score 6 = 78.6% Probability   PHQ-9 Depression Scale:  Total score:    PHQ-9 Scoring interpretation table:  Score 0-4 = No depression  Score 5-9 = Mild depression  Score 10-14 = Moderate depression  Score 15-19 = Moderately severe depression  Score 20-27 = Severe depression (2.4 times higher risk of SUD and 2.89 times higher risk of overuse)   Pharmacologic Plan: As per protocol, I have not taken over any controlled substance management, pending the results of ordered tests and/or consults.            Initial impression: Pending review of available data and ordered tests.  Meds   Current Outpatient Medications:    acetaminophen (TYLENOL) 325 MG tablet, Take 2 tablets (650 mg total) by mouth every 6 (six) hours as needed (for pain scale < 4  OR  temperature  >/=  100.5 F)., Disp: , Rfl:    busPIRone (BUSPAR) 7.5 MG tablet, Take 7.5 mg by mouth 2 (two) times daily., Disp: , Rfl:    doxylamine, Sleep, (UNISOM) 25 MG tablet, Take 25 mg by mouth at bedtime as needed for sleep., Disp: , Rfl:    EPINEPHrine 0.3 mg/0.3 mL IJ SOAJ injection, Inject into the muscle., Disp: ,  Rfl:    ferrous sulfate 325 (65 FE) MG tablet, Take 1 tablet (325 mg total) by mouth daily with breakfast. Take with Vitamin C, Disp: , Rfl:    fexofenadine (ALLEGRA) 60 MG tablet, Take by mouth., Disp: , Rfl:    ibuprofen (ADVIL) 600 MG tablet, Take 1 tablet (600 mg  total) by mouth every 6 (six) hours as needed., Disp: , Rfl:    oxyCODONE-acetaminophen (PERCOCET) 5-325 MG tablet, Take 1 tablet by mouth every 6 (six) hours as needed for severe pain (pain score 7-10)., Disp: 12 tablet, Rfl: 0   Pediatric Multivit-Minerals (FLINTSTONES GUMMIES PO), Take 2 tablets by mouth once., Disp: , Rfl:    valACYclovir (VALTREX) 500 MG tablet, Take 500 mg by mouth daily as needed., Disp: , Rfl:    venlafaxine (EFFEXOR) 37.5 MG tablet, Take 37.5 mg by mouth once., Disp: , Rfl:   Current Facility-Administered Medications:    ceFAZolin (ANCEF) IVPB 1 g/50 mL premix, 1 g, Intravenous, Once, Delano Metz, MD   midazolam (VERSED) injection 0.5-2 mg, 0.5-2 mg, Intravenous, Once, Delano Metz, MD  Imaging Review  Foot Imaging: Foot-R DG Complete: Results for orders placed during the hospital encounter of 12/24/20 DG Foot Complete Right  Narrative CLINICAL DATA:  27 year old female status post blunt trauma yesterday. Pain swelling and bruising at the top of the foot.  EXAM: RIGHT FOOT COMPLETE - 3+ VIEW  COMPARISON:  Right ankle series 05/08/2011.  FINDINGS: Bone mineralization is within normal limits. To there is no evidence of fracture or dislocation. There is no evidence of arthropathy or other focal bone abnormality. Distal right foot soft tissue swelling but no discrete soft tissue injury.  IMPRESSION: Soft tissue swelling. No acute fracture or dislocation identified about the right foot.   Electronically Signed By: Odessa Fleming M.D. On: 12/24/2020 09:51  Complexity Note: Imaging results reviewed.                         ROS  Cardiovascular: No reported cardiovascular signs or symptoms such as High blood pressure, coronary artery disease, abnormal heart rate or rhythm, heart attack, blood thinner therapy or heart weakness and/or failure Pulmonary or Respiratory: No reported pulmonary signs or symptoms such as wheezing and difficulty taking a deep  full breath (Asthma), difficulty blowing air out (Emphysema), coughing up mucus (Bronchitis), persistent dry cough, or temporary stoppage of breathing during sleep Neurological: No reported neurological signs or symptoms such as seizures, abnormal skin sensations, urinary and/or fecal incontinence, being born with an abnormal open spine and/or a tethered spinal cord Psychological-Psychiatric: Anxiousness and Depressed Gastrointestinal: No reported gastrointestinal signs or symptoms such as vomiting or evacuating blood, reflux, heartburn, alternating episodes of diarrhea and constipation, inflamed or scarred liver, or pancreas or irrregular and/or infrequent bowel movements Genitourinary: No reported renal or genitourinary signs or symptoms such as difficulty voiding or producing urine, peeing blood, non-functioning kidney, kidney stones, difficulty emptying the bladder, difficulty controlling the flow of urine, or chronic kidney disease Hematological: Weakness due to low blood hemoglobin or red blood cell count (Anemia) Endocrine:  gestational diabetes  Rheumatologic: Butterfly-like facial rash (Lupus) Musculoskeletal: Negative for myasthenia gravis, muscular dystrophy, multiple sclerosis or malignant hyperthermia Work History: Quit going to work on his/her own  Allergies  [name] is allergic to codeine, shrimp (diagnostic), hydrocod poli-chlorphe poli er, and sulfa antibiotics.  Laboratory Chemistry Profile   Renal Lab Results  Component Value Date   BUN 9 11/15/2023  CREATININE 0.56 11/15/2023   LABCREA 97 02/05/2018   GFRAA >60 02/06/2018   GFRNONAA >60 11/15/2023   PROTEINUR NEGATIVE 11/15/2023     Electrolytes Lab Results  Component Value Date   NA 141 11/15/2023   K 3.3 (L) 11/15/2023   CL 108 11/15/2023   CALCIUM 8.4 (L) 11/15/2023     Hepatic Lab Results  Component Value Date   AST 26 11/15/2023   ALT 21 11/15/2023   ALBUMIN 3.0 (L) 11/15/2023   ALKPHOS 69  11/15/2023     ID Lab Results  Component Value Date   HIV Non Reactive 09/22/2023   SARSCOV2NAA NEGATIVE 09/04/2022   PREGTESTUR NEGATIVE 02/07/2021     Bone No results found for: "VD25OH", "VD125OH2TOT", "ZO1096EA5", "WU9811BJ4", "25OHVITD1", "25OHVITD2", "25OHVITD3", "TESTOFREE", "TESTOSTERONE"   Endocrine Lab Results  Component Value Date   GLUCOSE 81 11/15/2023   GLUCOSEU NEGATIVE 11/15/2023     Neuropathy Lab Results  Component Value Date   HIV Non Reactive 09/22/2023     CNS No results found for: "COLORCSF", "APPEARCSF", "RBCCOUNTCSF", "WBCCSF", "POLYSCSF", "LYMPHSCSF", "EOSCSF", "PROTEINCSF", "GLUCCSF", "JCVIRUS", "CSFOLI", "IGGCSF", "LABACHR", "ACETBL"   Inflammation (CRP: Acute  ESR: Chronic) No results found for: "CRP", "ESRSEDRATE", "LATICACIDVEN"   Rheumatology No results found for: "RF", "ANA", "LABURIC", "URICUR", "LYMEIGGIGMAB", "LYMEABIGMQN", "HLAB27"   Coagulation Lab Results  Component Value Date   PLT 234 11/15/2023     Cardiovascular Lab Results  Component Value Date   HGB 9.5 (L) 11/15/2023   HCT 29.6 (L) 11/15/2023     Screening Lab Results  Component Value Date   SARSCOV2NAA NEGATIVE 09/04/2022   HIV Non Reactive 09/22/2023   PREGTESTUR NEGATIVE 02/07/2021     Cancer No results found for: "CEA", "CA125", "LABCA2"   Allergens No results found for: "ALMOND", "APPLE", "ASPARAGUS", "AVOCADO", "BANANA", "BARLEY", "BASIL", "BAYLEAF", "GREENBEAN", "LIMABEAN", "WHITEBEAN", "BEEFIGE", "REDBEET", "BLUEBERRY", "BROCCOLI", "CABBAGE", "MELON", "CARROT", "CASEIN", "CASHEWNUT", "CAULIFLOWER", "CELERY"     Note: Lab results reviewed.  PFSH  Drug: Rachel Ortega  reports no history of drug use. Alcohol:  reports that she does not currently use alcohol. Tobacco:  reports that she has never smoked. She has never used smokeless tobacco. Medical:  has a past medical history of Depression, Medical history non-contributory, and Preeclampsia, third trimester  (02/05/2018). Family: family history includes Cancer in her maternal grandfather; Healthy in her father; Lupus in her mother; Rheum arthritis in her mother.  Past Surgical History:  Procedure Laterality Date   TONSILLECTOMY Bilateral    TONSILLECTOMY     WISDOM TOOTH EXTRACTION Bilateral    Active Ambulatory Problems    Diagnosis Date Noted   family H/O inherited metabolic disease 78/29/5621   Thrombocytopenia due to blood loss 02/07/2018   Rash and nonspecific skin eruption 09/19/2023   Spotting affecting pregnancy 09/22/2023   Vaginal bleeding during pregnancy 09/22/2023   Anxiety and depression 07/14/2023   History of pre-eclampsia in prior pregnancy, currently pregnant 07/14/2023   Positive ANA (antinuclear antibody) 07/14/2023   Supervision of high risk pregnancy in second trimester 04/22/2023   SS-A antibody positive 09/22/2023   Obesity affecting pregnancy 09/22/2023   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, antepartum 09/22/2023   Gestational diabetes 11/11/2023   Postpartum hemorrhage 11/11/2023   NSVD (normal spontaneous vaginal delivery) 11/13/2023   Postdural puncture headache 11/18/2023   Postpartum spinal headache 11/18/2023   Anxiety due to invasive procedure 11/18/2023   Resolved Ambulatory Problems    Diagnosis Date Noted   First trimester screening  Pregnancy 10/24/2017   Elevated blood pressure complicating pregnancy, antepartum, third trimester 02/05/2018   Supervision of normal first pregnancy, antepartum 07/10/2017   Preeclampsia, third trimester 02/05/2018   Acute blood loss anemia 02/07/2018   History of UTI 08/20/2023   Elevated glucose tolerance test 09/22/2023   Past Medical History:  Diagnosis Date   Depression    Medical history non-contributory    Constitutional Exam  General appearance: Well nourished, well developed, and well hydrated. In no apparent acute distress Vitals:   11/18/23 1548 11/18/23 1551 11/18/23 1559  11/18/23 1610  BP: (!) 195/71 (!) 140/71 (!) 145/76 (!) 145/76  Pulse: (!) 50 (!) 59 (!) 58 (!) 48  Resp: 16 16 16 16   Temp:      TempSrc:      SpO2: 99% 97% 100% 99%  Weight:      Height:       BMI Assessment: Estimated body mass index is 34.86 kg/m as calculated from the following:   Height as of this encounter: 5\' 6"  (1.676 m).   Weight as of this encounter: 216 lb (98 kg).  BMI interpretation table: BMI level Category Range association with higher incidence of chronic pain  <18 kg/m2 Underweight   18.5-24.9 kg/m2 Ideal body weight   25-29.9 kg/m2 Overweight Increased incidence by 20%  30-34.9 kg/m2 Obese (Class I) Increased incidence by 68%  35-39.9 kg/m2 Severe obesity (Class II) Increased incidence by 136%  >40 kg/m2 Extreme obesity (Class III) Increased incidence by 254%   Patient's current BMI Ideal Body weight  Body mass index is 34.86 kg/m. Ideal body weight: 59.3 kg (130 lb 11.7 oz) Adjusted ideal body weight: 74.8 kg (164 lb 13.4 oz)   BMI Readings from Last 4 Encounters:  11/18/23 34.86 kg/m  11/11/23 36.80 kg/m  10/16/23 36.64 kg/m  10/01/23 35.99 kg/m   Wt Readings from Last 4 Encounters:  11/18/23 216 lb (98 kg)  11/11/23 228 lb (103.4 kg)  10/16/23 227 lb (103 kg)  10/01/23 223 lb (101.2 kg)    Psych/Mental status: Alert, oriented x 3 (person, place, & time)       Eyes: PERLA Respiratory: No evidence of acute respiratory distress  Assessment  Primary Diagnosis & Pertinent Problem List: The primary encounter diagnosis was Postdural puncture headache. Diagnoses of Postpartum spinal headache and Anxiety due to invasive procedure were also pertinent to this visit.  Visit Diagnosis (New problems to examiner): 1. Postdural puncture headache   2. Postpartum spinal headache   3. Anxiety due to invasive procedure    Plan of Care (Initial workup plan)  Note: Rachel Ortega was reminded that as per protocol, today's visit has been an evaluation only. We  have not taken over the patient's controlled substance management.  Problem-specific plan: Assessment and Plan    Post-Dural Puncture Headache (PDPH) Following epidural anesthesia for childbirth, they developed a Post-Dural Puncture Headache with onset the night of the procedure, worsening by Saturday. The pain, frontal and exacerbated by standing but relieved by lying down, is likely due to accidental dural puncture. They report no fever, night sweats, vomiting, but experience nausea and sweaty feet, without a history of frequent headaches. We discussed the immediate relief offered by an epidural blood patch, involving 10-20 cc of autologous blood, with a low risk of recurrence, especially if performed after 24-48 hours. They were informed about the small risk of recurrence if the procedure is done too soon and consented to the plan. We will perform the epidural blood  patch, administer Ansaid prophylactically to prevent infection, encourage increased fluid intake, especially fluids with caffeine, and complete a patient questionnaire prior to the procedure.  Back Pain They present with mild back pain following epidural anesthesia and childbirth, without radiation, weakness, or numbness, a common post-procedural and post-partum symptom. We will monitor the symptoms and provide supportive care as needed.       Lab Orders  No laboratory test(s) ordered today   Imaging Orders         DG PAIN CLINIC C-ARM 1-60 MIN NO REPORT     Referral Orders  No referral(s) requested today   Procedure Orders         EPIDURAL BLOOD PATCH     Pharmacotherapy (current): Medications ordered:  Meds ordered this encounter  Medications   iohexol (OMNIPAQUE) 180 MG/ML injection 10 mL    Must be Myelogram-compatible. If not available, you may substitute with a water-soluble, non-ionic, hypoallergenic, myelogram-compatible radiological contrast medium.   lidocaine (XYLOCAINE) 2 % (with pres) injection 400 mg    pentafluoroprop-tetrafluoroeth (GEBAUERS) aerosol   lactated ringers infusion   ceFAZolin (ANCEF) IVPB 1 g/50 mL premix    Order Specific Question:   Antibiotic Indication:    Answer:   Surgical Prophylaxis    Order Specific Question:   Other Indication:    Answer:   Procedure Prophylaxis   midazolam (VERSED) injection 0.5-2 mg    Make sure Flumazenil is available in the pyxis when using this medication. If oversedation occurs, administer 0.2 mg IV over 15 sec. If after 45 sec no response, administer 0.2 mg again over 1 min; may repeat at 1 min intervals; not to exceed 4 doses (1 mg)   Medications administered during this visit: We administered iohexol, lidocaine, pentafluoroprop-tetrafluoroeth, and lactated ringers.   Analgesic Pharmacotherapy:  Opioid Analgesics: For patients currently taking or requesting to take opioid analgesics, in accordance with [facility] Guidelines, we will assess their risks and indications for the use of these substances. After completing our evaluation, we may offer recommendations, but we no longer take patients for medication management. The prescribing physician will ultimately decide, based on his/her training and level of comfort whether to adopt any of the recommendations, including whether or not to prescribe such medicines.  Membrane stabilizer: To be determined at a later time  Muscle relaxant: To be determined at a later time  NSAID: To be determined at a later time  Other analgesic(s): To be determined at a later time   Interventional management options: Rachel Ortega was informed that there is no guarantee that she would be a candidate for interventional therapies. The decision will be based on the results of diagnostic studies, as well as Rachel Ortega's risk profile.  Procedure(s) under consideration:  Pending results of ordered studies      Interventional Therapies  Risk Factors  Considerations  Medical Comorbidities:     Planned   Pending:   Epidural blood patch today (11/18/2023)   Under consideration:   Pending   Completed:   None at this time   Therapeutic  Palliative (PRN) options:   None established   Completed by other providers:   None reported      Provider-requested follow-up: Return in about 2 weeks (around 12/02/2023) for (Face2F), (PPE).  Future Appointments  Date Time Provider Department Center  12/02/2023  2:20 PM Delano Metz, MD [facility] None    Duration of encounter: 30 minutes.  Total time on encounter, as per AMA guidelines included  both the face-to-face and non-face-to-face time personally spent by the physician and/or other qualified health care professional(s) on the day of the encounter (includes time in activities that require the physician or other qualified health care professional and does not include time in activities normally performed by clinical staff). Physician's time may include the following activities when performed: Preparing to see the patient (e.g., pre-charting review of records, searching for previously ordered imaging, lab work, and nerve conduction tests) Review of prior analgesic pharmacotherapies. Reviewing PMP Interpreting ordered tests (e.g., lab work, imaging, nerve conduction tests) Performing post-procedure evaluations, including interpretation of diagnostic procedures Obtaining and/or reviewing separately obtained history Performing a medically appropriate examination and/or evaluation Counseling and educating the patient/family/caregiver Ordering medications, tests, or procedures Referring and communicating with other health care professionals (when not separately reported) Documenting clinical information in the electronic or other health record Independently interpreting results (not separately reported) and communicating results to the patient/ family/caregiver Care coordination (not separately reported)  Note by: Oswaldo Done, MD  (AI and TTS technology used. I apologize for any typographical errors that were not detected and corrected.) Date: 11/18/2023; Time: 6:32 PM

## 2023-11-18 NOTE — Progress Notes (Signed)
Rachel Ortega was called today to discuss her complaint of headache. She received and epidural on 11/11/2023 of which she states it was a difficult placement and took a few attempts. She reports noticing a headache while recovering after her cesarean section but that it became severe on Saturday while at home. She was evaluated at the emergency department and a head CT was performed and resulted as negative for pathology. She reports the pain is severe in back, neck and head that is positional worsening when standing and improving with laying down. She has been taking her oxy/tylenol and advil without relief. She reports her hearing and vision are affected with standing up. She denies any other neurological concerns. She denies fever/chills.  The primary diagnosis is a dural puncture from epidural placement.  She was referred to the pain management clinic for a fluoroscopy guided epidural blood patch. She wants to receive this care.   Nelta Numbers MD Anesthesiology

## 2023-11-19 ENCOUNTER — Telehealth: Payer: Self-pay | Admitting: *Deleted

## 2023-11-19 NOTE — Telephone Encounter (Signed)
No problems post epidural blood patch.

## 2023-12-02 ENCOUNTER — Ambulatory Visit (HOSPITAL_BASED_OUTPATIENT_CLINIC_OR_DEPARTMENT_OTHER): Payer: Medicaid Other | Admitting: Pain Medicine

## 2023-12-02 DIAGNOSIS — Z09 Encounter for follow-up examination after completed treatment for conditions other than malignant neoplasm: Secondary | ICD-10-CM

## 2023-12-02 DIAGNOSIS — Z91199 Patient's noncompliance with other medical treatment and regimen due to unspecified reason: Secondary | ICD-10-CM

## 2023-12-02 NOTE — Progress Notes (Signed)
(  12/02/2023) NO-SHOW to postprocedure evaluation (blood patch).

## 2024-01-06 NOTE — Progress Notes (Signed)
 PROVIDER NOTE: Information contained herein reflects review and annotations entered in association with encounter. Interpretation of such information and data should be left to medically-trained personnel. Information provided to patient can be located elsewhere in the medical record under "Patient Instructions". Document created using STT-dictation technology, any transcriptional errors that may result from process are unintentional.    Patient: Rachel Ortega  Service Category: E/M  Provider: Candi Chafe, MD  DOB: July 20, 1996  DOS: 01/07/2024  Referring Provider: Laurence Pons, NP  MRN: [id number]  Specialty: Interventional Pain Management  PCP: Laurence Pons, NP  Type: Established Patient  Setting: Ambulatory outpatient    Location: Office  Delivery: Face-to-face     HPI  Ms. YATANA PIPE, a 28 y.o. year old female, is here today because of her Acute pain of right knee [M25.561]. Ms. Aakre primary complain today is Knee Pain  Pertinent problems: Ms. Marcengill has Positive ANA (antinuclear antibody); SS-A antibody positive; Postdural puncture headache; Postpartum spinal headache; Acute pain of right knee; and Right medial knee pain on their pertinent problem list. Pain Assessment: Severity of Acute pain (12/04/23) is reported as a 0-No pain/10. Location: Knee Right/sometimes will go up thigh and then down front of shin top of foot. Onset: More than a month ago. Quality: Aching, Burning, Discomfort. Timing: Intermittent. Modifying factor(s): Gabapentin nothing else. Vitals:  height is 5\' 6"  (1.676 m) and weight is 208 lb (94.3 kg). Her temperature is 99 F (37.2 C). Her blood pressure is 129/90 (abnormal) and her pulse is 95. Her respiration is 16 and oxygen saturation is 100%.  BMI: Estimated body mass index is 33.57 kg/m as calculated from the following:   Height as of this encounter: 5\' 6"  (1.676 m).   Weight as of this encounter: 208 lb (94.3 kg). Last encounter: 12/02/2023. Last  procedure: 11/18/2023.  Reason for encounter: patient-requested evaluation.  This patient was initially evaluated on 11/18/2023 for a post spinal headache.  On that day, a "fast track" blood patch was performed and the patient scheduled to return on 12/02/2023 for follow-up evaluation.  The patient never kept that appointment.  We recently received a phone call requesting evaluation for right knee pain, which is a new problem.  Review of the electronic medical record shows the patient to have a 1-2 view x-ray of the right knee done at a [facility] facility on 12/10/2023.  According to this diagnostic right knee x-ray the patient has normal joint spaces and alignment.  No evidence for acute fracture, dislocation or significant joint effusion.  There is an incidental small bone island in the proximal lateral tibia.  The x-ray was read as normal right knee radiograph.  Discussed the use of AI scribe software for clinical note transcription with the patient, who gave verbal consent to proceed.  History of Present Illness   The patient, with a history of recent pregnancy and a post-procedure spinal headache, presents with a new onset of severe, burning pain in the right knee. The pain, described as debilitating and akin to a blowtorch sensation, began suddenly during a routine activity and has since increased in frequency, occurring up to three times daily. The pain is unpredictable, lasting approximately thirty seconds, and can awaken the patient from sleep. It is localized to the medial aspect of the knee, occasionally radiating to the foot or up the leg. The patient denies any association with specific activities or positions.  The patient has been managing the pain with gabapentin, which has been  effective when taken regularly. However, when the medication was discontinued for a day or two, the severe burning pain returned. The patient also reports a new onset of headaches since the pregnancy, which were not  present prior. The patient is also on Effexor  for depression, doxycycline  for rosacea, and a muscle relaxer for back pain.      Post-procedure evaluation   Procedure: Epidural blood patch Laterality: Midline Paramedial  Level: Lumbosacral  DOS: 11/18/2023   Effectiveness:  Initial hour after procedure: 100 %. Subsequent 4-6 hours post-procedure: 100 %. Analgesia past initial 6 hours: 100 % (Spinal headache was relived by the blood patch however still has headaches here and there). Ongoing improvement:  Analgesic: The patient indicates having an ongoing 100% relief of the spinal headache.  She has noticed having more headaches after she gave birth, but they do not appear to be postdural puncture headaches. Function: Ms. Defenbaugh reports improvement in function ROM: Ms. Cashmore reports improvement in ROM   Pharmacotherapy Assessment  Analgesic:  Oxycodone /APAP 5/325, 1 tab p.o. 4 times daily (# 12) (last filled on 11/16/2023) MME/day: 30 mg/day   Monitoring: [place] PMP: PDMP reviewed during this encounter.       Pharmacotherapy: No side-effects or adverse reactions reported. Compliance: No problems identified. Effectiveness: Clinically acceptable.  Sibyl Drafts, RN  01/07/2024  3:02 PM  Sign when Signing Visit Safety precautions to be maintained throughout the outpatient stay will include: orient to surroundings, keep bed in low position, maintain call bell within reach at all times, provide assistance with transfer out of bed and ambulation.     No results found for: "CBDTHCR" No results found for: "D8THCCBX" No results found for: "D9THCCBX"  UDS:  No results found for: "SUMMARY"    ROS  Constitutional: Denies any fever or chills Gastrointestinal: No reported hemesis, hematochezia, vomiting, or acute GI distress Musculoskeletal: Denies any acute onset joint swelling, redness, loss of ROM, or weakness Neurological: No reported episodes of acute onset apraxia, aphasia, dysarthria,  agnosia, amnesia, paralysis, loss of coordination, or loss of consciousness  Medication Review  EPINEPHrine, acetaminophen , baclofen, cyclobenzaprine, doxycycline , ferrous sulfate , fexofenadine, gabapentin, ibuprofen , predniSONE , valACYclovir, and venlafaxine   History Review  Allergy: Ms. Dickmeyer is allergic to codeine, shrimp (diagnostic), hydrocod poli-chlorphe poli er, and sulfa antibiotics. Drug: Ms. Lehmkuhl  reports no history of drug use. Alcohol:  reports that she does not currently use alcohol. Tobacco:  reports that she has never smoked. She has never used smokeless tobacco. Social: Ms. Derick  reports that she has never smoked. She has never used smokeless tobacco. She reports that she does not currently use alcohol. She reports that she does not use drugs. Medical:  has a past medical history of Depression, Medical history non-contributory, and Preeclampsia, third trimester (02/05/2018). Surgical: Ms. Webb  has a past surgical history that includes Tonsillectomy (Bilateral); Wisdom tooth extraction (Bilateral); and Tonsillectomy. Family: family history includes Cancer in her maternal grandfather; Healthy in her father; Lupus in her mother; Rheum arthritis in her mother.  Laboratory Chemistry Profile   Renal Lab Results  Component Value Date   BUN 9 11/15/2023   CREATININE 0.56 11/15/2023   LABCREA 97 02/05/2018   GFRAA >60 02/06/2018   GFRNONAA >60 11/15/2023    Hepatic Lab Results  Component Value Date   AST 26 11/15/2023   ALT 21 11/15/2023   ALBUMIN 3.0 (L) 11/15/2023   ALKPHOS 69 11/15/2023    Electrolytes Lab Results  Component Value Date   NA 141  11/15/2023   K 3.3 (L) 11/15/2023   CL 108 11/15/2023   CALCIUM  8.4 (L) 11/15/2023    Bone No results found for: "VD25OH", "VD125OH2TOT", "ZO1096EA5", "WU9811BJ4", "25OHVITD1", "25OHVITD2", "25OHVITD3", "TESTOFREE", "TESTOSTERONE"  Inflammation (CRP: Acute Phase) (ESR: Chronic Phase) No results found for: "CRP",  "ESRSEDRATE", "LATICACIDVEN"       Note: Above Lab results reviewed.  Recent Imaging Review  DG PAIN CLINIC C-ARM 1-60 MIN NO REPORT Fluoro was used, but no Radiologist interpretation will be provided.  Please refer to "NOTES" tab for provider progress note. Note: Reviewed        Physical Exam  General appearance: Well nourished, well developed, and well hydrated. In no apparent acute distress Mental status: Alert, oriented x 3 (person, place, & time)       Respiratory: No evidence of acute respiratory distress Eyes: PERLA Vitals: BP (!) 129/90   Pulse 95   Temp 99 F (37.2 C)   Resp 16   Ht 5\' 6"  (1.676 m)   Wt 208 lb (94.3 kg)   LMP 02/17/2023   SpO2 100%   BMI 33.57 kg/m  BMI: Estimated body mass index is 33.57 kg/m as calculated from the following:   Height as of this encounter: 5\' 6"  (1.676 m).   Weight as of this encounter: 208 lb (94.3 kg). Ideal: Ideal body weight: 59.3 kg (130 lb 11.7 oz) Adjusted ideal body weight: 73.3 kg (161 lb 10.2 oz)  Assessment   Diagnosis Status  1. Acute pain of right knee   2. Right medial knee pain   3. Positive ANA (antinuclear antibody)   4. SS-A antibody positive    New problem Not improving Controlled   Updated Problems: Problem  Acute Pain of Right Knee  Right Medial Knee Pain    Plan of Care  Problem-specific:  Assessment and Plan    Right Knee Pain with Neuropathic Features Intermittent, severe burning and electrical pain in the medial right knee occasionally radiates to the foot and upper leg. The pain is unpredictable and not related to activity. An initial x-ray was unremarkable, suggesting a neuropathic origin. Gabapentin has been effective, indicating a nerve-related issue. The differential diagnosis includes nerve inflammation or compression. A nine-day steroid taper was discussed for potential nerve inflammation, with emphasis on adrenal suppression risks and the importance of completing the taper. An MRI may  be necessary if pain persists to evaluate soft tissue structures. Physical therapy and injections will be considered if conservative measures fail. Regular gabapentin use is emphasized to maintain therapeutic levels. Order a four-view x-ray of the right knee. Prescribe a nine-day steroid taper. Continue gabapentin 300 mg in the morning, 300 mg in the afternoon, and 600 mg at night. Order an MRI if pain persists after the steroid taper. Consider physical therapy if pain persists. Schedule a follow-up in two weeks.  Headaches New onset post-pregnancy headaches with no specific triggers identified. Monitor headache frequency and severity. Consider further evaluation if headaches persist or worsen.  General Health Maintenance No current diabetes issues; gestational diabetes has resolved. Continue Effexor  for depression, doxycycline  for rosacea, and a muscle relaxer as needed for back pain.  Follow-up Schedule a follow-up appointment in two weeks. Complete the x-ray immediately. Await further instructions on MRI and physical therapy based on follow-up evaluation.      Ms. ANAMARIE KIES has a current medication list which includes the following long-term medication(s): fexofenadine, gabapentin, venlafaxine , and ferrous sulfate .  Pharmacotherapy (Medications Ordered): Meds ordered this encounter  Medications   predniSONE  (DELTASONE ) 20 MG tablet    Sig: Take 3 tablets (60 mg total) by mouth daily with breakfast for 3 days, THEN 2 tablets (40 mg total) daily with breakfast for 3 days, THEN 1 tablet (20 mg total) daily with breakfast for 3 days.    Dispense:  18 tablet    Refill:  0   Orders:  Orders Placed This Encounter  Procedures   DG Knee Complete 4 Views Right    Standing Status:   Future    Expiration Date:   04/06/2024    Scheduling Instructions:     Please make sure that the patient understands that this needs to be done as soon as possible. Never have the patient do the imaging "just  before the next appointment". Inform patient that having the imaging done within the [facility] will expedite the availability of the results and will provide      imaging availability to the requesting physician. In addition inform the patient that the imaging order has an expiration date and will not be renewed if not done within the active period.    Reason for Exam (SYMPTOM  OR DIAGNOSIS REQUIRED):   Right knee pain/arthralgia    Is patient pregnant?:   No    Preferred imaging location?:   [facility]    Release to patient:   Immediate    Call Results- Best Contact Number?:   [phone] [facility]   Follow-up plan:   Return in about 2 weeks (around 01/21/2024) for Eval-day (M,W), (F2F), (post-Steroid Taper).      Interventional Therapies  Risk Factors  Considerations  Medical Comorbidities:     Planned  Pending:   Steroid taper for right knee pain. 4 view diagnostic x-rays of the right knee. Continue with the gabapentin as is.   Under consideration:   Possible physical therapy for knee pain should she continue to have pain after steroid taper. Possible right knee MRI should she continue having the pain despite the steroid taper and the physical therapy. Possible diagnostic right knee injections should all the above fails and pathology is found on the right knee MRI.   Completed:   Therapeutic Lumbar epidural blood patch x1 (11/18/2023) (100/100/100/100)   Therapeutic  Palliative (PRN) options:   None established   Completed by other providers:   None reported      Recent Visits Date Type Provider Dept  11/18/23 Procedure visit Renaldo Caroli, MD [facility]  Showing recent visits within past 90 days and meeting all other requirements Today's Visits Date Type Provider Dept  01/07/24 Office Visit Renaldo Caroli, MD [facility]  Showing today's visits and meeting all other  requirements Future Appointments Date Type Provider Dept  01/21/24 Appointment Renaldo Caroli, MD [facility]  Showing future appointments within next 90 days and meeting all other requirements  I discussed the assessment and treatment plan with the patient. The patient was provided an opportunity to ask questions and all were answered. The patient agreed with the plan and demonstrated an understanding of the instructions.  Patient advised to call back or seek an in-person evaluation if the symptoms or condition worsens.  Duration of encounter: 35 minutes.  Total time on encounter, as per AMA guidelines included both the face-to-face and non-face-to-face time personally spent by the physician and/or other qualified health care professional(s) on the day of the encounter (includes time in activities that require the  physician or other qualified health care professional and does not include time in activities normally performed by clinical staff). Physician's time may include the following activities when performed: Preparing to see the patient (e.g., pre-charting review of records, searching for previously ordered imaging, lab work, and nerve conduction tests) Review of prior analgesic pharmacotherapies. Reviewing PMP Interpreting ordered tests (e.g., lab work, imaging, nerve conduction tests) Performing post-procedure evaluations, including interpretation of diagnostic procedures Obtaining and/or reviewing separately obtained history Performing a medically appropriate examination and/or evaluation Counseling and educating the patient/family/caregiver Ordering medications, tests, or procedures Referring and communicating with other health care professionals (when not separately reported) Documenting clinical information in the electronic or other health record Independently interpreting results (not separately reported) and communicating results to the patient/  family/caregiver Care coordination (not separately reported)  Note by: Candi Chafe, MD Date: 01/07/2024; Time: 4:23 PM

## 2024-01-07 ENCOUNTER — Ambulatory Visit: Payer: Medicaid Other | Admitting: Pain Medicine

## 2024-01-07 ENCOUNTER — Ambulatory Visit
Admission: RE | Admit: 2024-01-07 | Discharge: 2024-01-07 | Disposition: A | Discharge status: Home / Self Care | Payer: Medicaid Other | Source: Ambulatory Visit | Attending: Pain Medicine | Admitting: Pain Medicine

## 2024-01-07 VITALS — BP 129/90 | HR 95 | Temp 99.0°F | Resp 16 | Ht 66.0 in | Wt 208.0 lb

## 2024-01-07 DIAGNOSIS — M25561 Pain in right knee: Secondary | ICD-10-CM | POA: Insufficient documentation

## 2024-01-07 DIAGNOSIS — R768 Other specified abnormal immunological findings in serum: Secondary | ICD-10-CM

## 2024-01-07 MED ORDER — PREDNISONE 20 MG PO TABS: ORAL_TABLET | ORAL | 0 refills | Status: AC

## 2024-01-07 NOTE — Progress Notes (Signed)
 Safety precautions to be maintained throughout the outpatient stay will include: orient to surroundings, keep bed in low position, maintain call bell within reach at all times, provide assistance with transfer out of bed and ambulation.

## 2024-01-20 NOTE — Progress Notes (Deleted)
 PROVIDER NOTE: Information contained herein reflects review and annotations entered in association with encounter. Interpretation of such information and data should be left to medically-trained personnel. Information provided to patient can be located elsewhere in the medical record under "Patient Instructions". Document created using STT-dictation technology, any transcriptional errors that may result from process are unintentional.    Patient: Rachel Ortega  Service Category: E/M  Provider: Oswaldo Done, MD  DOB: September 01, 1996  DOS: 01/21/2024  Referring Provider: Noralyn Pick, NP  MRN: [id number]  Specialty: Interventional Pain Management  PCP: Rachel Pick, NP  Type: Established Patient  Setting: Ambulatory outpatient    Location: Office  Delivery: Face-to-face     HPI  [name], a 28 y.o. year old female, is here today because of her No primary diagnosis found.. [name] primary complain today is No chief complaint on file.  Pertinent problems: [name] has Positive ANA (antinuclear antibody); SS-A antibody positive; Postdural puncture headache; Postpartum spinal headache; Acute pain of right knee; and Right medial knee pain on their pertinent problem list. Pain Assessment: Severity of   is reported as a  /10. Location:    / . Onset:  . Quality:  . Timing:  . Modifying factor(s):  Marland Kitchen Vitals:  vitals were not taken for this visit.  BMI: Estimated body mass index is 33.57 kg/m as calculated from the following:   Height as of 01/07/24: 5\' 6"  (1.676 m).   Weight as of 01/07/24: 208 lb (94.3 kg). Last encounter: 01/07/2024. Last procedure: 11/18/2023.  Reason for encounter:  *** . ***  Discussed the use of AI scribe software for clinical note transcription with the patient, who gave verbal consent to proceed.  History of Present Illness           Pharmacotherapy Assessment  Analgesic: No chronic opioid analgesics therapy prescribed by our practice. Oxycodone/APAP  5/325, 1 tab p.o. 4 times daily (# 12) (last filled on 11/16/2023) MME/day: 30 mg/day   Monitoring: Rachel Ortega: PDMP reviewed during this encounter.       Pharmacotherapy: No side-effects or adverse reactions reported. Compliance: No problems identified. Effectiveness: Clinically acceptable.  No notes on file  No results found for: "CBDTHCR" No results found for: "D8THCCBX" No results found for: "D9THCCBX"  UDS:  No results found for: "SUMMARY"    ROS  Constitutional: Denies any fever or chills Gastrointestinal: No reported hemesis, hematochezia, vomiting, or acute GI distress Musculoskeletal: Denies any acute onset joint swelling, redness, loss of ROM, or weakness Neurological: No reported episodes of acute onset apraxia, aphasia, dysarthria, agnosia, amnesia, paralysis, loss of coordination, or loss of consciousness  Medication Review  EPINEPHrine, acetaminophen, baclofen, cyclobenzaprine, doxycycline, ferrous sulfate, fexofenadine, gabapentin, ibuprofen, valACYclovir, and venlafaxine  History Review  Allergy: [name] is allergic to codeine, shrimp (diagnostic), hydrocod poli-chlorphe poli er, and sulfa antibiotics. Drug: [name]  reports no history of drug use. Alcohol:  reports that she does not currently use alcohol. Tobacco:  reports that she has never smoked. She has never used smokeless tobacco. Social: [name]  reports that she has never smoked. She has never used smokeless tobacco. She reports that she does not currently use alcohol. She reports that she does not use drugs. Medical:  has a past medical history of Depression, Medical history non-contributory, and Preeclampsia, third trimester (02/05/2018). Surgical: [name]  has a past surgical history that includes Tonsillectomy (Bilateral); Wisdom tooth extraction (Bilateral); and Tonsillectomy. Family: family history includes Cancer  in her maternal grandfather; Healthy in her father; Lupus in her mother; Rheum  arthritis in her mother.  Laboratory Chemistry Profile   Renal Lab Results  Component Value Date   BUN 9 11/15/2023   CREATININE 0.56 11/15/2023   LABCREA 97 02/05/2018   GFRAA >60 02/06/2018   GFRNONAA >60 11/15/2023    Hepatic Lab Results  Component Value Date   AST 26 11/15/2023   ALT 21 11/15/2023   ALBUMIN 3.0 (L) 11/15/2023   ALKPHOS 69 11/15/2023    Electrolytes Lab Results  Component Value Date   NA 141 11/15/2023   K 3.3 (L) 11/15/2023   CL 108 11/15/2023   CALCIUM 8.4 (L) 11/15/2023    Bone No results found for: "VD25OH", "VD125OH2TOT", "NA3557DU2", "GU5427CW2", "25OHVITD1", "25OHVITD2", "25OHVITD3", "TESTOFREE", "TESTOSTERONE"  Inflammation (CRP: Acute Phase) (ESR: Chronic Phase) No results found for: "CRP", "ESRSEDRATE", "LATICACIDVEN"       Note: Above Lab results reviewed.  Recent Imaging Review  DG Knee Complete 4 Views Right CLINICAL DATA:  Right knee pain/arthralgia. Acute. Positive ANA antinuclear antibody and SS-A antibody positive.  EXAM: RIGHT KNEE - COMPLETE 4+ VIEW  COMPARISON:  None Available.  FINDINGS: Normal bone mineralization. Joint spaces are preserved. No joint effusion. No acute fracture or dislocation. No cortical erosion.  IMPRESSION: Normal right knee radiographs.  Electronically Signed   By: Rachel Ortega M.D.   On: 01/07/2024 22:51 Note: Reviewed        Physical Exam  General appearance: Well nourished, well developed, and well hydrated. In no apparent acute distress Mental status: Alert, oriented x 3 (person, place, & time)       Respiratory: No evidence of acute respiratory distress Eyes: PERLA Vitals: LMP 02/17/2023  BMI: Estimated body mass index is 33.57 kg/m as calculated from the following:   Height as of 01/07/24: 5\' 6"  (1.676 m).   Weight as of 01/07/24: 208 lb (94.3 kg). Ideal: Ideal body weight: 59.3 kg (130 lb 11.7 oz) Adjusted ideal body weight: 73.3 kg (161 lb 10.2 oz)  Assessment   Diagnosis  Status  No diagnosis found. Controlled Controlled Controlled   Updated Problems: No problems updated.  Plan of Care  Problem-specific:  Assessment and Plan            Rachel Ortega has a current medication list which includes the following long-term medication(s): ferrous sulfate, fexofenadine, gabapentin, and venlafaxine.  Pharmacotherapy (Medications Ordered): No orders of the defined types were placed in this encounter.  Orders:  No orders of the defined types were placed in this encounter.  Follow-up plan:   No follow-ups on file.      Interventional Therapies  Risk Factors  Considerations  Medical Comorbidities:     Planned  Pending:   Steroid taper for right knee pain. 4 view diagnostic x-rays of the right knee. Continue with the gabapentin as is.   Under consideration:   Possible physical therapy for knee pain should she continue to have pain after steroid taper. Possible right knee MRI should she continue having the pain despite the steroid taper and the physical therapy. Possible diagnostic right knee injections should all the above fails and pathology is found on the right knee MRI.   Completed:   Therapeutic Lumbar epidural blood patch x1 (11/18/2023) (100/100/100/100)   Therapeutic  Palliative (PRN) options:   None established   Completed by other providers:   None reported     Recent Visits Date Type Provider Dept  01/07/24 Office  Visit Delano Metz, MD [facility]  11/18/23 Procedure visit Delano Metz, MD [facility]  Showing recent visits within past 90 days and meeting all other requirements Future Appointments Date Type Provider Dept  01/21/24 Appointment Delano Metz, MD [facility]  Showing future appointments within next 90 days and meeting all other requirements  I discussed the assessment and treatment plan with the patient. The patient was provided an opportunity to ask  questions and all were answered. The patient agreed with the plan and demonstrated an understanding of the instructions.  Patient advised to call back or seek an in-person evaluation if the symptoms or condition worsens.  Duration of encounter: *** minutes.  Total time on encounter, as per AMA guidelines included both the face-to-face and non-face-to-face time personally spent by the physician and/or other qualified health care professional(s) on the day of the encounter (includes time in activities that require the physician or other qualified health care professional and does not include time in activities normally performed by clinical staff). Physician's time may include the following activities when performed: Preparing to see the patient (e.g., pre-charting review of records, searching for previously ordered imaging, lab work, and nerve conduction tests) Review of prior analgesic pharmacotherapies. Reviewing Ortega Interpreting ordered tests (e.g., lab work, imaging, nerve conduction tests) Performing post-procedure evaluations, including interpretation of diagnostic procedures Obtaining and/or reviewing separately obtained history Performing a medically appropriate examination and/or evaluation Counseling and educating the patient/family/caregiver Ordering medications, tests, or procedures Referring and communicating with other health care professionals (when not separately reported) Documenting clinical information in the electronic or other health record Independently interpreting results (not separately reported) and communicating results to the patient/ family/caregiver Care coordination (not separately reported)  Note by: Rachel Done, MD Date: 01/21/2024; Time: 8:15 AM

## 2024-01-21 ENCOUNTER — Encounter: Payer: Medicaid Other | Admitting: Pain Medicine

## 2024-01-27 DIAGNOSIS — M76891 Other specified enthesopathies of right lower limb, excluding foot: Secondary | ICD-10-CM | POA: Insufficient documentation

## 2024-01-27 NOTE — Progress Notes (Signed)
 PROVIDER NOTE: Information contained herein reflects review and annotations entered in association with encounter. Interpretation of such information and data should be left to medically-trained personnel. Information provided to patient can be located elsewhere in the medical record under Patient Instructions. Document created using STT-dictation technology, any transcriptional errors that may result from process are unintentional.    Patient: Rachel Ortega  Service Category: E/M  Provider: Eric DELENA Como, MD  DOB: 04-22-1996  DOS: 01/28/2024  Referring Provider: Geralene Levorn ORN, NP  MRN: [id number]  Specialty: Interventional Pain Management  PCP: Geralene Levorn ORN, NP  Type: Established Patient  Setting: Ambulatory outpatient    Location: Office  Delivery: Face-to-face     HPI  Ms. TYREE VANDRUFF, a 28 y.o. year old female, is here today because of her Acute pain of right knee [M25.561]. Ms. Huntley primary complain today is Knee Pain (Right )  Pertinent problems: Ms. Godeaux has Positive ANA (antinuclear antibody); SS-A antibody positive; Postdural puncture headache; Postpartum spinal headache; Acute pain of right knee; Right medial knee pain; and Enthesopathy of right knee region on their pertinent problem list. Pain Assessment: Severity of Chronic pain is reported as a 10-Worst pain ever/10. Location: Knee Right/Sometimes will go up thigh and then down front of shin top of foot. Onset: More than a month ago. Quality: Aching, Burning, Discomfort. Timing: Intermittent. Modifying factor(s): Gabapentin. Vitals:  height is 5' 6 (1.676 m) and weight is 206 lb (93.4 kg). Her temporal temperature is 99.5 F (37.5 C). Her blood pressure is 132/78 and her pulse is 109 (abnormal). Her respiration is 16 and oxygen saturation is 100%.  BMI: Estimated body mass index is 33.25 kg/m as calculated from the following:   Height as of this encounter: 5' 6 (1.676 m).   Weight as of this encounter: 206 lb (93.4  kg). Last encounter: 01/07/2024. Last procedure: 11/18/2023.  Reason for encounter: follow-up evaluation after steroid taper.   Discussed the use of AI scribe software for clinical note transcription with the patient, who gave verbal consent to proceed.  History of Present Illness   Rachel Ortega is a 28 year old female who presents with persistent right knee pain.  She experiences persistent pain in her right knee, which occasionally radiates down to her foot and slightly upwards. The pain sometimes extends to the top of her foot and into her big toe. She was previously given a steroid pack, which did not significantly alleviate her symptoms. She attempted to skip doses of gabapentin to assess its effect on her pain, but the pain persisted. Her current medication regimen includes gabapentin, although the specific dose and frequency were not discussed.  She also experiences back pain affecting her entire spine, from her neck down to her lower back. The pain is sometimes felt in the middle of her back and can be accompanied by aches in her neck and lower back. She is uncertain if the back pain is related to her knee pain. She has experienced a blood patch in the past, which she described as 'one of the worst pains' she has ever experienced.       Pharmacotherapy Assessment  Analgesic: No chronic opioid analgesics therapy prescribed by our practice. Oxycodone /APAP 5/325, 1 tab p.o. 4 times daily (# 12) (last filled on 11/16/2023) MME/day: 30 mg/day   Monitoring: [place] PMP: PDMP reviewed during this encounter.       Pharmacotherapy: No side-effects or adverse reactions reported. Compliance: No problems identified. Effectiveness: Clinically acceptable.  Bonner Norris, RN  01/28/2024  9:25 AM  Sign when Signing Visit Safety precautions to be maintained throughout the outpatient stay will include: orient to surroundings, keep bed in low position, maintain call bell within reach at all times, provide  assistance with transfer out of bed and ambulation.     No results found for: CBDTHCR No results found for: D8THCCBX No results found for: D9THCCBX  UDS:  No results found for: SUMMARY    ROS  Constitutional: Denies any fever or chills Gastrointestinal: No reported hemesis, hematochezia, vomiting, or acute GI distress Musculoskeletal: Denies any acute onset joint swelling, redness, loss of ROM, or weakness Neurological: No reported episodes of acute onset apraxia, aphasia, dysarthria, agnosia, amnesia, paralysis, loss of coordination, or loss of consciousness  Medication Review  EPINEPHrine , acetaminophen , baclofen, cyclobenzaprine, doxycycline , fexofenadine, gabapentin, ibuprofen , ivermectin, norelgestromin-ethinyl estradiol, valACYclovir, and venlafaxine   History Review  Allergy: Ms. Madura is allergic to codeine, shrimp (diagnostic), hydrocod poli-chlorphe poli er, and sulfa antibiotics. Drug: Ms. Higley  reports no history of drug use. Alcohol:  reports that she does not currently use alcohol. Tobacco:  reports that she has never smoked. She has never used smokeless tobacco. Social: Ms. Graul  reports that she has never smoked. She has never used smokeless tobacco. She reports that she does not currently use alcohol. She reports that she does not use drugs. Medical:  has a past medical history of Depression, Medical history non-contributory, and Preeclampsia, third trimester (02/05/2018). Surgical: Ms. Montufar  has a past surgical history that includes Tonsillectomy (Bilateral); Wisdom tooth extraction (Bilateral); and Tonsillectomy. Family: family history includes Cancer in her maternal grandfather; Healthy in her father; Lupus in her mother; Rheum arthritis in her mother.  Laboratory Chemistry Profile   Renal Lab Results  Component Value Date   BUN 9 11/15/2023   CREATININE 0.56 11/15/2023   LABCREA 97 02/05/2018   GFRAA >60 02/06/2018   GFRNONAA >60 11/15/2023     Hepatic Lab Results  Component Value Date   AST 26 11/15/2023   ALT 21 11/15/2023   ALBUMIN 3.0 (L) 11/15/2023   ALKPHOS 69 11/15/2023    Electrolytes Lab Results  Component Value Date   NA 141 11/15/2023   K 3.3 (L) 11/15/2023   CL 108 11/15/2023   CALCIUM  8.4 (L) 11/15/2023    Bone No results found for: VD25OH, VD125OH2TOT, CI6874NY7, CI7874NY7, 25OHVITD1, 25OHVITD2, 25OHVITD3, TESTOFREE, TESTOSTERONE  Inflammation (CRP: Acute Phase) (ESR: Chronic Phase) No results found for: CRP, ESRSEDRATE, LATICACIDVEN       Note: Above Lab results reviewed.  Recent Imaging Review  DG Knee Complete 4 Views Right CLINICAL DATA:  Right knee pain/arthralgia. Acute. Positive ANA antinuclear antibody and SS-A antibody positive.  EXAM: RIGHT KNEE - COMPLETE 4+ VIEW  COMPARISON:  None Available.  FINDINGS: Normal bone mineralization. Joint spaces are preserved. No joint effusion. No acute fracture or dislocation. No cortical erosion.  IMPRESSION: Normal right knee radiographs.  Electronically Signed   By: Tanda Lyons M.D.   On: 01/07/2024 22:51 Note: Reviewed        Physical Exam  General appearance: Well nourished, well developed, and well hydrated. In no apparent acute distress Mental status: Alert, oriented x 3 (person, place, & time)       Respiratory: No evidence of acute respiratory distress Eyes: PERLA Vitals: BP 132/78 (Cuff Size: Normal)   Pulse (!) 109   Temp 99.5 F (37.5 C) (Temporal)   Resp 16   Ht 5' 6 (1.676 m)  Wt 206 lb (93.4 kg)   LMP 02/17/2023   SpO2 100%   BMI 33.25 kg/m  BMI: Estimated body mass index is 33.25 kg/m as calculated from the following:   Height as of this encounter: 5' 6 (1.676 m).   Weight as of this encounter: 206 lb (93.4 kg). Ideal: Ideal body weight: 59.3 kg (130 lb 11.7 oz) Adjusted ideal body weight: 73 kg (160 lb 13.4 oz)  Assessment   Diagnosis Status  1. Acute pain of right knee   2. Right  medial knee pain   3. Enthesopathy of right knee region   4. Positive ANA (antinuclear antibody)   5. SS-A antibody positive    Controlled Controlled Controlled   Updated Problems: No problems updated.   Plan of Care  Problem-specific:  Assessment and Plan    Right Knee Pain   Chronic right knee pain radiates intermittently to the foot. A previous steroid pack was ineffective. The differential diagnosis includes knee pathology versus referred pain from the lumbar spine. A diagnostic knee injection with numbing medicine is planned to identify the pain source. If the pain is knee-related, relief will occur during the numbing period. Persistent pain suggests a spinal origin. Complete relief from the injection may negate the need for further imaging, while partial relief may require back evaluation. The procedure's complication risk is under 1%. Plan: perform the diagnostic knee injection, refer to physical therapy for knee pain management, and provide information on over-the-counter supplements for pain management.  Back Pain   Chronic back pain affects the entire spine, occasionally radiating to the foot, indicating possible L5 nerve involvement. The differential includes lumbar disc herniation or other spinal pathology. Further imaging depends on the knee injection results. If the injection does not provide complete relief, an MRI of the back may be necessary to assess for lumbar disc herniation or other issues. Plan: consider MRI of the back if needed and refer to physical therapy for back pain management.  General Health Maintenance   Discussed over-the-counter supplements for pain management, providing a table with supplements, their benefits, and applicable conditions. Plan: include supplement information in the after-visit summary.  Follow-up   Schedule the knee injection pending insurance approval. Follow up on physical therapy outcomes. Consider an MRI of the knee if pain persists  after the injection.       Ms. RHYLEE PUCILLO has a current medication list which includes the following long-term medication(s): fexofenadine, gabapentin, norelgestromin-ethinyl estradiol, and venlafaxine .  Pharmacotherapy (Medications Ordered): No orders of the defined types were placed in this encounter.  Orders:  Orders Placed This Encounter  Procedures   KNEE INJECTION    Local Anesthetic & Steroid injection.    Standing Status:   Future    Expiration Date:   04/25/2024    Scheduling Instructions:     Side: Right-sided     Sedation: None     Timeframe: As soon as schedule allows    Where will this procedure be performed?:   [facility]   Ambulatory referral to Physical Therapy    Referral Priority:   Routine    Referral Type:   Physical Medicine    Referral Reason:   Specialty Services Required    Requested Specialty:   Physical Therapy    Number of Visits Requested:   1   Follow-up plan:   Return for (Clinic): (R) [place] Knee inj. .      Interventional Therapies  Risk Factors  Considerations  Medical  Comorbidities:     Planned  Pending:   Diagnostic right IA steroid knee joint injection #1    Under consideration:   Possible physical therapy for knee pain should she continue to have pain after steroid taper. Possible right knee MRI should she continue having the pain despite the steroid taper and the physical therapy. Possible diagnostic right knee injections should all the above fails and pathology is found on the right knee MRI.   Completed:   Therapeutic Lumbar epidural blood patch x1 (11/18/2023) (100/100/100/100)   Therapeutic  Palliative (PRN) options:   None established   Completed by other providers:   None reported     Recent Visits Date Type Provider Dept  01/07/24 Office Visit Tanya Glisson, MD [facility]  11/18/23 Procedure visit Tanya Glisson, MD [facility]  Showing recent visits within past 90 days and  meeting all other requirements Today's Visits Date Type Provider Dept  01/28/24 Office Visit Tanya Glisson, MD [facility]  Showing today's visits and meeting all other requirements Future Appointments No visits were found meeting these conditions. Showing future appointments within next 90 days and meeting all other requirements  I discussed the assessment and treatment plan with the patient. The patient was provided an opportunity to ask questions and all were answered. The patient agreed with the plan and demonstrated an understanding of the instructions.  Patient advised to call back or seek an in-person evaluation if the symptoms or condition worsens.  Duration of encounter: 30 minutes.  Total time on encounter, as per AMA guidelines included both the face-to-face and non-face-to-face time personally spent by the physician and/or other qualified health care professional(s) on the day of the encounter (includes time in activities that require the physician or other qualified health care professional and does not include time in activities normally performed by clinical staff). Physician's time may include the following activities when performed: Preparing to see the patient (e.g., pre-charting review of records, searching for previously ordered imaging, lab work, and nerve conduction tests) Review of prior analgesic pharmacotherapies. Reviewing PMP Interpreting ordered tests (e.g., lab work, imaging, nerve conduction tests) Performing post-procedure evaluations, including interpretation of diagnostic procedures Obtaining and/or reviewing separately obtained history Performing a medically appropriate examination and/or evaluation Counseling and educating the patient/family/caregiver Ordering medications, tests, or procedures Referring and communicating with other health care professionals (when not separately reported) Documenting clinical information in the electronic or  other health record Independently interpreting results (not separately reported) and communicating results to the patient/ family/caregiver Care coordination (not separately reported)  Note by: Glisson DELENA Tanya, MD Date: 01/28/2024; Time: 10:05 AM

## 2024-01-28 ENCOUNTER — Encounter: Payer: Self-pay | Admitting: Pain Medicine

## 2024-01-28 ENCOUNTER — Ambulatory Visit: Payer: Medicaid Other | Attending: Pain Medicine | Admitting: Pain Medicine

## 2024-01-28 VITALS — BP 132/78 | HR 109 | Temp 99.5°F | Resp 16 | Ht 66.0 in | Wt 206.0 lb

## 2024-01-28 DIAGNOSIS — R768 Other specified abnormal immunological findings in serum: Secondary | ICD-10-CM | POA: Diagnosis present

## 2024-01-28 DIAGNOSIS — M25561 Pain in right knee: Secondary | ICD-10-CM | POA: Diagnosis not present

## 2024-01-28 DIAGNOSIS — M76891 Other specified enthesopathies of right lower limb, excluding foot: Secondary | ICD-10-CM | POA: Diagnosis present

## 2024-01-28 DIAGNOSIS — R7689 Other specified abnormal immunological findings in serum: Secondary | ICD-10-CM

## 2024-01-28 NOTE — Patient Instructions (Signed)
 ______________________________________________________________________    Procedure instructions  Stop blood-thinners  Do not eat or drink fluids (other than water) for 6 hours before your procedure  No water for 2 hours before your procedure  Take your blood pressure medicine with a sip of water  Arrive 30 minutes before your appointment  If sedation is planned, bring suitable driver. Teddie Favre, [place], & public transportation are NOT APPROVED)  Carefully read the "Preparing for your procedure" detailed instructions  If you have questions call us  at [phone]  Procedure appointments are for procedures only. NO medication refills or new problem evaluations.   ______________________________________________________________________      ______________________________________________________________________    Preparing for your procedure  Appointments: If you think you may not be able to keep your appointment, call 24-48 hours in advance to cancel. We need time to make it available to others.  Procedure visits are for procedures only. During your procedure appointment there will be: NO Prescription Refills*. NO medication changes or discussions*. NO discussion of disability issues*. NO unrelated pain problem evaluations*. NO evaluations to order other pain procedures*. *These will be addressed at a separate and distinct evaluation encounter on the provider's evaluation schedule and not during procedure days.  Instructions: Food intake: Avoid eating anything solid for at least 8 hours prior to your procedure. Clear liquid intake: You may take clear liquids such as water up to 2 hours prior to your procedure. (No carbonated drinks. No soda.) Transportation: Unless otherwise stated by your physician, bring a driver. (Driver cannot be a Market researcher, Pharmacist, community, or any other form of public transportation.) Morning Medicines: Except for blood thinners, take all of your other morning  medications with a sip of water. Make sure to take your heart and blood pressure medicines. If your blood pressure's lower number is above 100, the case will be rescheduled. Blood thinners: Make sure to stop your blood thinners as instructed.  If you take a blood thinner, but were not instructed to stop it, call our office [phone] and ask to talk to a nurse. Not stopping a blood thinner prior to certain procedures could lead to serious complications. Diabetics on insulin: Notify the staff so that you can be scheduled 1st case in the morning. If your diabetes requires high dose insulin, take only  of your normal insulin dose the morning of the procedure and notify the staff that you have done so. Preventing infections: Shower with an antibacterial soap the morning of your procedure.  Build-up your immune system: Take 1000 mg of Vitamin C with every meal (3 times a day) the day prior to your procedure. Antibiotics: Inform the nursing staff if you are taking any antibiotics or if you have any conditions that may require antibiotics prior to procedures. (Example: recent joint implants)   Pregnancy: If you are pregnant make sure to notify the nursing staff. Not doing so may result in injury to the fetus, including death.  Sickness: If you have a cold, fever, or any active infections, call and cancel or reschedule your procedure. Receiving steroids while having an infection may result in complications. Arrival: You must be in the facility at least 30 minutes prior to your scheduled procedure. Tardiness: Your scheduled time is also the cutoff time. If you do not arrive at least 15 minutes prior to your procedure, you will be rescheduled.  Children: Do not bring any children with you. Make arrangements to keep them home. Dress appropriately: There is always a possibility that your clothing may get  soiled. Avoid long dresses. Valuables: Do not bring any jewelry or valuables.  Reasons to call and  reschedule or cancel your procedure: (Following these recommendations will minimize the risk of a serious complication.) Surgeries: Avoid having procedures within 2 weeks of any surgery. (Avoid for 2 weeks before or after any surgery). Flu Shots: Avoid having procedures within 2 weeks of a flu shots or . (Avoid for 2 weeks before or after immunizations). Barium: Avoid having a procedure within 7-10 days after having had a radiological study involving the use of radiological contrast. (Myelograms, Barium swallow or enema study). Heart attacks: Avoid any elective procedures or surgeries for the initial 6 months after a "Myocardial Infarction" (Heart Attack). Blood thinners: It is imperative that you stop these medications before procedures. Let us  know if you if you take any blood thinner.  Infection: Avoid procedures during or within two weeks of an infection (including chest colds or gastrointestinal problems). Symptoms associated with infections include: Localized redness, fever, chills, night sweats or profuse sweating, burning sensation when voiding, cough, congestion, stuffiness, runny nose, sore throat, diarrhea, nausea, vomiting, cold or Flu symptoms, recent or current infections. It is specially important if the infection is over the area that we intend to treat. Heart and lung problems: Symptoms that may suggest an active cardiopulmonary problem include: cough, chest pain, breathing difficulties or shortness of breath, dizziness, ankle swelling, uncontrolled high or unusually low blood pressure, and/or palpitations. If you are experiencing any of these symptoms, cancel your procedure and contact your primary care physician for an evaluation.  Remember:  Regular Business hours are:  Monday to Thursday 8:00 AM to 4:00 PM  Provider's Schedule: Renaldo Caroli, MD:  Procedure days: Tuesday and Thursday 7:30 AM to 4:00 PM  Cephus Collin, MD:  Procedure days: Monday and Wednesday 7:30 AM to 4:00  PM Last  Updated: 12/02/2023 ______________________________________________________________________      ______________________________________________________________________    General Risks and Possible Complications  Patient Responsibilities: It is important that you read this as it is part of your informed consent. It is our duty to inform you of the risks and possible complications associated with treatments offered to you. It is your responsibility as a patient to read this and to ask questions about anything that is not clear or that you believe was not covered in this document.  Patient's Rights: You have the right to refuse treatment. You also have the right to change your mind, even after initially having agreed to have the treatment done. However, under this last option, if you wait until the last second to change your mind, you may be charged for the materials used up to that point.  Introduction: Medicine is not an Visual merchandiser. Everything in Medicine, including the lack of treatment(s), carries the potential for danger, harm, or loss (which is by definition: Risk). In Medicine, a complication is a secondary problem, condition, or disease that can aggravate an already existing one. All treatments carry the risk of possible complications. The fact that a side effects or complications occurs, does not imply that the treatment was conducted incorrectly. It must be clearly understood that these can happen even when everything is done following the highest safety standards.  No treatment: You can choose not to proceed with the proposed treatment alternative. The "PRO(s)" would include: avoiding the risk of complications associated with the therapy. The "CON(s)" would include: not getting any of the treatment benefits. These benefits fall under one of three categories: diagnostic; therapeutic; and/or  palliative. Diagnostic benefits include: getting information which can ultimately lead to  improvement of the disease or symptom(s). Therapeutic benefits are those associated with the successful treatment of the disease. Finally, palliative benefits are those related to the decrease of the primary symptoms, without necessarily curing the condition (example: decreasing the pain from a flare-up of a chronic condition, such as incurable terminal cancer).  General Risks and Complications: These are associated to most interventional treatments. They can occur alone, or in combination. They fall under one of the following six (6) categories: no benefit or worsening of symptoms; bleeding; infection; nerve damage; allergic reactions; and/or death. No benefits or worsening of symptoms: In Medicine there are no guarantees, only probabilities. No healthcare provider can ever guarantee that a medical treatment will work, they can only state the probability that it may. Furthermore, there is always the possibility that the condition may worsen, either directly, or indirectly, as a consequence of the treatment. Bleeding: This is more common if the patient is taking a blood thinner, either prescription or over the counter (example: Goody Powders, Fish oil, Aspirin, Garlic, etc.), or if suffering a condition associated with impaired coagulation (example: Hemophilia, cirrhosis of the liver, low platelet counts, etc.). However, even if you do not have one on these, it can still happen. If you have any of these conditions, or take one of these drugs, make sure to notify your treating physician. Infection: This is more common in patients with a compromised immune system, either due to disease (example: diabetes, cancer, human immunodeficiency virus [HIV], etc.), or due to medications or treatments (example: therapies used to treat cancer and rheumatological diseases). However, even if you do not have one on these, it can still happen. If you have any of these conditions, or take one of these drugs, make sure to notify  your treating physician. Nerve Damage: This is more common when the treatment is an invasive one, but it can also happen with the use of medications, such as those used in the treatment of cancer. The damage can occur to small secondary nerves, or to large primary ones, such as those in the spinal cord and brain. This damage may be temporary or permanent and it may lead to impairments that can range from temporary numbness to permanent paralysis and/or brain death. Allergic Reactions: Any time a substance or material comes in contact with our body, there is the possibility of an allergic reaction. These can range from a mild skin rash (contact dermatitis) to a severe systemic reaction (anaphylactic reaction), which can result in death. Death: In general, any medical intervention can result in death, most of the time due to an unforeseen complication. ______________________________________________________________________     ______________________________________________________________________    OTC Supplements:   The following is a list of over-the-counter (OTC) supplements that have been found to have [employer]) studies suggesting that they may be of some benefits when used in moderation in some chronic pain-related conditions.  NOTE:  Always consult with your primary care provider and/or pharmacist before taking any OTC medications to make sure they will not interact with your current medications. Always use manufacturer's recommended dosage.  Supplement Possible benefit May be of benefit in treatment of   Turmeric/curcumin anti-inflammatory Joint and muscle aches and pain.  Glucosamine/chondroitin (triple strength) may slow loss of articular cartilage Joint pain.  Vitamin D -3* may suppress release of chemicals associated with inflammation. Increases tolerance to pain. Joint and muscle aches and pain.   Moringa(+)  anti-inflammatory with mild analgesic effects Joint and  muscle aches and pain.  Melatonin(+) Helps reset sleep cycle. Insomnia.  Vitamin B-12* may help keep nerves and blood cells healthy as well as maintaining function of nervous system Nerve pain (Burning pain)  Alpha-Lipoic-Acid (ALA)* antioxidant that may help with nerve health, pain, and blocking the activation of some inflammatory chemicals Diabetic neuropathy and metabolic syndrome  superoxide dismutase (SOD)** Currently being reviewed.   Tiger Balm Currently being reviewed.   hydrolyzed collagen peptides* Currently being reviewed.  Collagen supplementation may increases bone strength, density, and mass; may improve joint stiffness/mobility, and functionality; and may reduce joint pain. Possible chondroprotective effects. May help with protection of joint health.   Methylsulfonylmethane (MSM)* Currently being reviewed.   CBD(+) Currently being reviewed.   Delta-8 THC(+) Currently being reviewed.   *  Generally Recognized As Safe (GRAS) approved substance.-FDA ([employer]) ** "Possibly Safe", but not considered Generally Recognized As Safe (Not GRAS) by the [place]  Food and Drug Administration (FDA) as a food additive. (+) Not considered Generally Recognized As Safe (Not GRAS) by the [place]  Food and Drug Administration (FDA) as a food additive.  ______________________________________________________________________

## 2024-01-28 NOTE — Progress Notes (Signed)
 Safety precautions to be maintained throughout the outpatient stay will include: orient to surroundings, keep bed in low position, maintain call bell within reach at all times, provide assistance with transfer out of bed and ambulation.

## 2024-02-19 ENCOUNTER — Encounter: Payer: Self-pay | Admitting: Pain Medicine

## 2024-02-19 ENCOUNTER — Ambulatory Visit: Payer: Medicaid Other | Attending: Pain Medicine | Admitting: Pain Medicine

## 2024-02-19 VITALS — BP 134/76 | HR 98 | Temp 98.5°F | Resp 16 | Ht 66.0 in | Wt 206.0 lb

## 2024-02-19 DIAGNOSIS — E66811 Obesity, class 1: Secondary | ICD-10-CM | POA: Diagnosis present

## 2024-02-19 DIAGNOSIS — M25561 Pain in right knee: Secondary | ICD-10-CM | POA: Diagnosis present

## 2024-02-19 DIAGNOSIS — M76891 Other specified enthesopathies of right lower limb, excluding foot: Secondary | ICD-10-CM

## 2024-02-19 MED ORDER — ROPIVACAINE HCL 2 MG/ML IJ SOLN
INTRAMUSCULAR | Status: AC
  Filled 2024-02-19: qty 20

## 2024-02-19 MED ORDER — LIDOCAINE HCL 2 % IJ SOLN
INTRAMUSCULAR | Status: AC
  Filled 2024-02-19: qty 20

## 2024-02-19 MED ORDER — PENTAFLUOROPROP-TETRAFLUOROETH EX AERO
INHALATION_SPRAY | Freq: Once | CUTANEOUS | Status: AC
  Administered 2024-02-19: 30 via TOPICAL

## 2024-02-19 MED ORDER — LIDOCAINE HCL (PF) 2 % IJ SOLN
5.0000 mL | Freq: Once | INTRAMUSCULAR | Status: AC
  Administered 2024-02-19: 5 mL

## 2024-02-19 MED ORDER — METHYLPREDNISOLONE ACETATE 80 MG/ML IJ SUSP
INTRAMUSCULAR | Status: AC
  Filled 2024-02-19: qty 1

## 2024-02-19 MED ORDER — METHYLPREDNISOLONE ACETATE 80 MG/ML IJ SUSP
80.0000 mg | Freq: Once | INTRAMUSCULAR | Status: AC
  Administered 2024-02-19: 80 mg via INTRA_ARTICULAR

## 2024-02-19 MED ORDER — ROPIVACAINE HCL 2 MG/ML IJ SOLN
3.0000 mL | Freq: Once | INTRAMUSCULAR | Status: AC
  Administered 2024-02-19: 20 mL via INTRA_ARTICULAR

## 2024-02-19 NOTE — Patient Instructions (Signed)

## 2024-02-19 NOTE — Progress Notes (Signed)
 PROVIDER NOTE: Interpretation of information contained herein should be left to medically-trained personnel. Specific patient instructions are provided elsewhere under "Patient Instructions" section of medical record. This document was created in part using STT-dictation technology, any transcriptional errors that may result from this process are unintentional.  Patient: Rachel Ortega Type: Established DOB: 1996/05/09 MRN: [id number] PCP: Noralyn Pick, NP  Service: Procedure DOS: 02/19/2024 Setting: Ambulatory Location: Ambulatory outpatient facility Delivery: Face-to-face Provider: Oswaldo Done, MD Specialty: Interventional Pain Management Specialty designation: 09 Location: Outpatient facility Ref. 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 Acute knee pain (Right)   2. Medial knee pain (Right)   3. Enthesopathy of knee region (Right)   4. Obesity, Class I, BMI 30-34.9    NAS-11 score:   Pre-procedure: 10-Worst pain ever/10   Post-procedure: 10-Worst pain ever/10     Pre-Procedure Preparation  Monitoring: As per clinic protocol.  Risk Assessment: Vitals:  EXB:[id number] body mass index is 33.25 kg/m as calculated from the following:   Height as of this encounter: 5\' 6"  (1.676 m).   Weight as of this encounter: 206 lb (93.4 kg)., Rate:98 , BP:134/76, Resp:16, Temp:98.5 F (36.9 C), SpO2:98 %  Allergies: She is allergic to codeine, shrimp (diagnostic),  hydrocod poli-chlorphe poli er, and sulfa antibiotics.  Precautions: No additional precautions required  Blood-thinner(s): None at this time  Coagulopathies: Reviewed. None identified.   Active Infection(s): Reviewed. None identified. Rachel Ortega is afebrile   Location setting: Exam room Position: Sitting w/ knee bent 90 degrees Safety Precautions: Patient was assessed for positional comfort and pressure points before starting the procedure. Prepping solution: DuraPrep (Iodine Povacrylex [0.7% available iodine] and Isopropyl Alcohol, 74% w/w) Prep Area: Entire knee region Approach: percutaneous, just above the tibial plateau, lateral to the infrapatellar tendon. Intended target: Intra-articular knee space Materials: Tray: Block Needle(s): Regular Qty: 1/side Length: 1.5-inch Gauge: 25G (x1) + 22G (x1)  Meds ordered this encounter  Medications   pentafluoroprop-tetrafluoroeth (GEBAUERS) aerosol   lidocaine HCl (PF) (XYLOCAINE) 2 % injection 5 mL   ropivacaine (PF) 2 mg/mL (0.2%) (NAROPIN) injection 3 mL   methylPREDNISolone acetate (DEPO-MEDROL) injection 80 mg    Orders Placed This Encounter  Procedures   KNEE INJECTION    Local Anesthetic & Steroid injection.    Scheduling Instructions:     Side(s): Right Knee     Sedation: None     Timeframe: Today    Where will this procedure be performed?:   [facility]   Informed Consent Details: Physician/Practitioner Attestation; Transcribe to consent form and obtain patient signature    Note: Always confirm laterality of pain with Ms. Tyminski, before procedure. Transcribe to consent form and obtain patient signature.    Physician/Practitioner attestation of informed consent for procedure/surgical case:   I, the physician/practitioner, attest that I have discussed with the patient the benefits, risks, side effects, alternatives,  likelihood of achieving goals and potential problems during recovery for the procedure that I have  provided informed consent.    Procedure:   Right-sided intra-articular knee arthrocentesis (aspiration and/or injection)    Physician/Practitioner performing the procedure:   Karryn Kosinski A. Laban Emperor, MD    Indication/Reason:   Chronic right-sided knee pain secondary to knee arthropathy/arthralgia   Provide equipment / supplies at bedside    Procedure tray: "Block Tray" (Disposable  single use) Skin infiltration needle: Regular 1.5-in, 25-G, (x1) Block Needle type: Regular Amount/quantity: 1 Size: Short(1.5-inch) Gauge: (25G x1) + (22G x1)    Standing Status:   Standing    Number of Occurrences:   1    Specify:   Block Tray     Time-out: 1139 I initiated and conducted the "Time-out" before starting the procedure, as per protocol. The patient was asked to participate by confirming the accuracy of the "Time Out" information. Verification of the correct person, site, and procedure were performed and confirmed by me, the nursing staff, and the patient. "Time-out" conducted as per Joint Commission's Universal Protocol (UP.01.01.01). Procedure checklist: Completed  H&P (Pre-op  Assessment)  Rachel Ortega is a 28 y.o. (year old), female patient, seen today for interventional treatment. She  has a past surgical history that includes Tonsillectomy (Bilateral); Wisdom tooth extraction (Bilateral); and Tonsillectomy. Rachel Ortega has a current medication list which includes the following prescription(s): acetaminophen, baclofen, doxycycline, doxylamine (sleep), epinephrine, gabapentin, ibuprofen, ivermectin, norelgestromin-ethinyl estradiol, valacyclovir, and venlafaxine. Her primarily concern today is the Knee Pain (right)  She is allergic to codeine, shrimp (diagnostic), hydrocod poli-chlorphe poli er, and sulfa antibiotics.   Last encounter: My last encounter with her was on 01/28/2024. Pertinent problems: Ms. Eustice has Positive ANA (antinuclear antibody); SS-A antibody positive; Postdural puncture headache;  Postpartum spinal headache; Acute knee pain (Right); Medial knee pain (Right); and Enthesopathy of knee region (Right) on their pertinent problem list. Pain Assessment: Severity of Chronic pain is reported as a 10-Worst pain ever/10. Location: Knee Right/ . Onset: More than a month ago. Quality: Burning, Sharp. Timing: Intermittent. Modifying factor(s): medications. Vitals:  height is 5\' 6"  (1.676 m) and weight is 206 lb (93.4 kg). Her temporal temperature is 98.5 F (36.9 C). Her blood pressure is 134/76 and her pulse is 98. Her respiration is 16 and oxygen saturation is 98%.   Reason for encounter: "interventional pain management therapy due pain of at least four (4) weeks in duration, with failure to respond and/or inability to tolerate more conservative care.  Site Confirmation: Ms. Arambula was asked to confirm the procedure and laterality before marking the site.  Consent: Before the procedure and under the influence of no sedative(s), amnesic(s), or anxiolytics, the patient was informed of the treatment options, risks and possible complications. To fulfill our ethical and legal obligations, as recommended by the American Medical Association's Code of Ethics, I have informed the patient of my clinical impression; the nature and purpose of the treatment or procedure; the risks, benefits, and possible complications of the intervention; the alternatives, including doing nothing; the risk(s) and benefit(s) of the alternative treatment(s) or procedure(s); and the risk(s) and benefit(s) of doing nothing. The patient was provided information about the general risks and possible complications associated with the procedure. These may include, but are not limited to: failure to achieve desired goals, infection, bleeding, organ or nerve damage, allergic reactions, paralysis, and death. In addition, the patient was informed of those risks and complications associated to Spine-related procedures, such as failure  to  decrease pain; infection (i.e.: Meningitis, epidural or intraspinal abscess); bleeding (i.e.: epidural hematoma, subarachnoid hemorrhage, or any other type of intraspinal or peri-dural bleeding); organ or nerve damage (i.e.: Any type of peripheral nerve, nerve root, or spinal cord injury) with subsequent damage to sensory, motor, and/or autonomic systems, resulting in permanent pain, numbness, and/or weakness of one or several areas of the body; allergic reactions; (i.e.: anaphylactic reaction); and/or death. Furthermore, the patient was informed of those risks and complications associated with the medications. These include, but are not limited to: allergic reactions (i.e.: anaphylactic or anaphylactoid reaction(s)); adrenal axis suppression; blood sugar elevation that in diabetics may result in ketoacidosis or comma; water retention that in patients with history of congestive heart failure may result in shortness of breath, pulmonary edema, and decompensation with resultant heart failure; weight gain; swelling or edema; medication-induced neural toxicity; particulate matter embolism and blood vessel occlusion with resultant organ, and/or nervous system infarction; and/or aseptic necrosis of one or more joints. Finally, the patient was informed that Medicine is not an exact science; therefore, there is also the possibility of unforeseen or unpredictable risks and/or possible complications that may result in a catastrophic outcome. The patient indicated having understood very clearly. We have given the patient no guarantees and we have made no promises. Enough time was given to the patient to ask questions, all of which were answered to the patient's satisfaction. Ms. Cephus has indicated that she wanted to continue with the procedure. Attestation: I, the ordering provider, attest that I have discussed with the patient the benefits, risks, side-effects, alternatives, likelihood of achieving goals, and potential  problems during recovery for the procedure that I have provided informed consent.  Date  Time: 02/19/2024 10:55 AM  Description of procedure  Start Time: 1139 hrs  Local Anesthesia: Once the patient was positioned, prepped, and time-out was completed. The target area was identified located. The skin was marked with an approved surgical skin marker. Once marked, the skin (epidermis, dermis, and hypodermis), and deeper tissues (fat, connective tissue and muscle) were infiltrated with a small amount of a short-acting local anesthetic, loaded on a 10cc syringe with a 25G, 1.5-in  Needle. An appropriate amount of time was allowed for local anesthetics to take effect before proceeding to the next step. Local Anesthetic: Lidocaine 1-2% The unused portion of the local anesthetic was discarded in the proper designated containers. Safety Precautions: Aspiration looking for blood return was conducted prior to all injections. At no point did I inject any substances, as a needle was being advanced. Before injecting, the patient was told to immediately notify me if she was experiencing any new onset of "ringing in the ears, or metallic taste in the mouth". No attempts were made at seeking any paresthesias. Safe injection practices and needle disposal techniques used. Medications properly checked for expiration dates. SDV (single dose vial) medications used. After the completion of the procedure, all disposable equipment used was discarded in the proper designated medical waste containers.  Technical description: Protocol guidelines were followed. After positioning, the target area was identified and prepped in the usual manner. Skin & deeper tissues infiltrated with local anesthetic. Appropriate amount of time allowed to pass for local anesthetics to take effect. Proper needle placement secured. Once satisfactory needle placement was confirmed, I proceeded to inject the desired solution in slow, incremental fashion,  intermittently assessing for discomfort or any signs of abnormal or undesired spread of substance. Once completed, the needle was removed and disposed of,  as per hospital protocols. The area was cleaned, making sure to leave some of the prepping solution back to take advantage of its long term bactericidal properties.  Aspiration:  Negative        Vitals:   02/19/24 1053  BP: 134/76  Pulse: 98  Resp: 16  Temp: 98.5 F (36.9 C)  TempSrc: Temporal  SpO2: 98%  Weight: 206 lb (93.4 kg)  Height: 5\' 6"  (1.676 m)    End Time: 1140 hrs  Imaging guidance  Imaging-assisted Technique: None required. Indication(s): N/A Exposure Time: N/A Contrast: None Fluoroscopic Guidance: N/A Ultrasound Guidance: N/A Interpretation: N/A  Post-op assessment  Post-procedure Vital Signs:  Pulse/HCG Rate: 98  Temp: 98.5 F (36.9 C) Resp: 16 BP: 134/76 SpO2: 98 %  EBL: None  Complications: No immediate post-treatment complications observed by team, or reported by patient.  Note: The patient tolerated the entire procedure well. A repeat set of vitals were taken after the procedure and the patient was kept under observation following institutional policy, for this type of procedure. Post-procedural neurological assessment was performed, showing return to baseline, prior to discharge. The patient was provided with post-procedure discharge instructions, including a section on how to identify potential problems. Should any problems arise concerning this procedure, the patient was given instructions to immediately contact us, at any time, without hesitation. In any case, we plan to contact the patient by telephone for a follow-up status report regarding this interventional procedure.  Comments:  No additional relevant information.  Plan of care  Chronic Opioid Analgesic:   No chronic opioid analgesics therapy prescribed by our practice. Oxycodone/APAP 5/325, 1 tab p.o. 4 times daily (# 12) (last  filled on 11/16/2023) MME/day: 30 mg/day   Medications administered: We administered pentafluoroprop-tetrafluoroeth, lidocaine HCl (PF), ropivacaine (PF) 2 mg/mL (0.2%), and methylPREDNISolone acetate.  Follow-up plan:   Return in about 2 weeks (around 03/04/2024) for (Face2F), (PPE).      Interventional Therapies  Risk Factors  Considerations  Medical Comorbidities:     Planned  Pending:   Diagnostic right IA steroid knee joint injection #1    Under consideration:   Possible physical therapy for knee pain should she continue to have pain after steroid taper. Possible right knee MRI should she continue having the pain despite the steroid taper and the physical therapy. Possible diagnostic right knee injections should all the above fails and pathology is found on the right knee MRI.   Completed:   Therapeutic Lumbar epidural blood patch x1 (11/18/2023) (100/100/100/100)   Therapeutic  Palliative (PRN) options:   None established   Completed by other providers:   None reported     Recent Visits Date Type Provider Dept  01/28/24 Office Visit Delano Metz, MD [facility]  01/07/24 Office Visit Delano Metz, MD [facility]  Showing recent visits within past 90 days and meeting all other requirements Today's Visits Date Type Provider Dept  02/19/24 Procedure visit Delano Metz, MD [facility]  Showing today's visits and meeting all other requirements Future Appointments Date Type Provider Dept  03/09/24 Appointment Delano Metz, MD [facility]  Showing future appointments within next 90 days and meeting all other requirements   Disposition: Discharge home  Discharge (Date  Time): 02/19/2024; 1145 hrs.   Primary Care Physician: Noralyn Pick, NP Location: [facility] Note by: Oswaldo Done, MD Date: 02/19/2024; Time: 12:00 PM  DISCLAIMER: Medicine is not an Doctor, general practice. It has no guarantees  or warranties. The decision to proceed with this intervention was based on the information collected from the patient. Conclusions were drawn from the patient's questionnaire, interview, and examination. Because information was provided in large part by the patient, it cannot be guaranteed that it has not been purposely or unconsciously manipulated or altered. Every effort has been made to obtain as much accurate, relevant, available data as possible. Always take into account that the treatment will also be dependent on availability of resources and existing treatment guidelines, considered by other Pain Management Specialists as being common knowledge and practice, at the time of the intervention. It is also important to point out that variation in procedural techniques and pharmacological choices are the acceptable norm. For Medico-Legal review purposes, the indications, contraindications, technique, and results of the these procedures should only be evaluated, judged and interpreted by a Board-Certified Interventional Pain Specialist with extensive familiarity and expertise in the same exact procedure and technique.

## 2024-02-20 ENCOUNTER — Telehealth: Payer: Self-pay

## 2024-02-20 NOTE — Telephone Encounter (Signed)
 Attempted to contact patient. No answer and LVM regarding procedure f/u.

## 2024-03-04 ENCOUNTER — Ambulatory Visit: Payer: Medicaid Other | Attending: Pain Medicine | Admitting: Physical Therapy

## 2024-03-04 ENCOUNTER — Encounter: Payer: Self-pay | Admitting: Physical Therapy

## 2024-03-04 DIAGNOSIS — M79604 Pain in right leg: Secondary | ICD-10-CM

## 2024-03-04 DIAGNOSIS — R262 Difficulty in walking, not elsewhere classified: Secondary | ICD-10-CM

## 2024-03-04 DIAGNOSIS — M76891 Other specified enthesopathies of right lower limb, excluding foot: Secondary | ICD-10-CM | POA: Insufficient documentation

## 2024-03-04 DIAGNOSIS — M25561 Pain in right knee: Secondary | ICD-10-CM | POA: Diagnosis not present

## 2024-03-04 NOTE — Therapy (Signed)
 OUTPATIENT PHYSICAL THERAPY LOWER EXTREMITY EVALUATION  Patient Name: Rachel Ortega MRN: [id number] DOB:[birth year], 28 y.o., female Today's Date: 03/04/2024  END OF SESSION:  PT End of Session - 03/04/24 0953     Visit Number 1    Number of Visits 13    Date for PT Re-Evaluation 04/15/24    PT Start Time 0955    PT Stop Time 1030    PT Time Calculation (min) 35 min    Behavior During Therapy [facility] for tasks assessed/performed             Past Medical History:  Diagnosis Date   Depression    Medical history non-contributory    Preeclampsia, third trimester 02/05/2018   Past Surgical History:  Procedure Laterality Date   TONSILLECTOMY Bilateral    TONSILLECTOMY     WISDOM TOOTH EXTRACTION Bilateral    Patient Active Problem List   Diagnosis Date Noted   Obesity, Class I, BMI 30-34.9 02/19/2024   Enthesopathy of knee region (Right) 01/27/2024   Acute knee pain (Right) 01/07/2024   Medial knee pain (Right) 01/07/2024   Postdural puncture headache 11/18/2023   Postpartum spinal headache 11/18/2023   Anxiety due to invasive procedure 11/18/2023   NSVD (normal spontaneous vaginal delivery) 11/13/2023   Gestational diabetes 11/11/2023   Postpartum hemorrhage 11/11/2023   Spotting affecting pregnancy 09/22/2023   Vaginal bleeding during pregnancy 09/22/2023   SS-A antibody positive 09/22/2023   Obesity affecting pregnancy 09/22/2023   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, antepartum 09/22/2023   Rash and nonspecific skin eruption 09/19/2023   Anxiety and depression 07/14/2023   History of pre-eclampsia in prior pregnancy, currently pregnant 07/14/2023   Positive ANA (antinuclear antibody) 07/14/2023   Supervision of high risk pregnancy in second trimester 04/22/2023   Thrombocytopenia due to blood loss 02/07/2018   family H/O inherited metabolic disease 65/78/4696    PCP: Noralyn Pick, NP  REFERRING PROVIDER: Delano Metz,  MD  REFERRING DIAG:  [phone] (ICD-10-CM) - Acute pain of right knee  M25.561 (ICD-10-CM) - Right medial knee pain  M76.891 (ICD-10-CM) - Enthesopathy of right knee region    RATIONALE FOR EVALUATION AND TREATMENT: Rehabilitation  THERAPY DIAG: No diagnosis found.  ONSET DATE: 10/2023  FOLLOW-UP APPT SCHEDULED WITH REFERRING PROVIDER: Yes ; 03/09/24   SUBJECTIVE:                                                                                                                                                                                         SUBJECTIVE STATEMENT:  Pt is a 28 year old female with primary c/o R knee/medial thigh/leg pain.  PERTINENT HISTORY: Pt is a 28 year old female with primary c/o R knee/medial thigh/leg pain. Pt had injections on 02/19/24 in R knee that did not help. Patient reports negative experience with epidural, and she feels that her symptoms began after this. Patient reports burning "all of a sudden" pain that can go down to her foot. She states it is debilitating. Pt is taking Gabapentin for pain control. No significant integumentary change. Her child is 9 months old and she has 9-year-old. Pt reports doing well with childcare duties. Pt reports being unable to step on RLE when she is having episode of pain. Pt reports some nocturnal symptoms and even pain when at rest; sporadic onset of symptoms. Symptoms go away after 1-2 minutes. Pt is ANA positive; no active autoimmune disease.   PAIN:   Pain Intensity: Present: 0/10, Best: 0/10, Worst: 10/10 Pain location: anteromedial knee along medial peripatellar region  Pain quality: burning Radiating pain: Yes ; radiating from distomedial thigh down to dorsum of foot Swelling: No  Popping, catching, locking: No  Numbness/Tingling: No Focal weakness or buckling: No Aggravating factors: Hurts sporadically without aggravating position/posture Relieving factors: Gabapentin, massage along distomedial thigh/VMO   24-hour pain behavior: None  History of prior back, hip, or knee injury, pain, surgery, or therapy: Yes; hx of back pain and chiropractic   Imaging: Yes ;  X-rays negative  Prior level of function: Independent 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 No  Living Environment Lives with: lives with their partner, 46 y/o daughter, 40-month-old boy Lives in: House/apartment  Patient Goals: Knee pain gone; able to get off of Gabapentin   OBJECTIVE:   Patient Surveys  LEFS 12/80 = 15%  Cognition Patient is oriented to person, place, and time.  Recent memory is intact.  Remote memory is intact.  Attention span and concentration are intact.  Expressive speech is intact.  Patient's fund of knowledge is within normal limits for educational level.    Gross Musculoskeletal Assessment Tremor: None Bulk: Normal Tone: Norma Pt squats with weight on forefoot and is able to maintain SLR well with control of pronation of ankles    GAIT: Distance walked: 50 ft Assistive device utilized: None Level of assistance: Complete Independence Comments: L>R ankle overpronation  Posture: Mild rounded shoulders c self-selected posture   AROM  Thoracolumbar AROM Flexion 75% (no pain) Extension WNL (no pain) Lateral flexion: R 50%, L 100% Rotation: R 100%, L 75%   AROM (Normal range in degrees) AROM 03/04/24  Hip Right Left  Flexion (125) WNL   Extension (15)    Abduction (40)    Adduction     Internal Rotation (45) WNL   External Rotation (45) WNL       Knee    Flexion (135) WNL   Extension (0) WNL       (* = pain; Blank rows = not tested)  LE MMT: MMT (out of 5) Right 03/04/24 Left 03/04/24  Hip  flexion 4+ 4+  Hip extension    Hip abduction    Hip adduction    Hip internal rotation 5 5  Hip external rotation 4 4+  Knee flexion 5 5  Knee extension 5 5  Ankle dorsiflexion    Ankle plantarflexion    Ankle inversion  Ankle eversion    (* = pain; Blank rows = not tested)  Sensation Grossly intact to light touch bilateral LEs as determined by testing dermatomes L2-S2. Proprioception, and hot/cold testing deferred on this date.  Reflexes R/L Knee Jerk (L3/4): Check next visit Ankle Jerk (S1/2): Check next visit  Muscle Length Hamstrings: R: Positive L: Positive Quadriceps (Ely): R: Positive L: Positive  Palpation TTP pes anserinus 1, adductor longus 1 Graded on 0-4 scale (0 = no pain, 1 = pain, 2 = pain with wincing/grimacing/flinching, 3 = pain with withdrawal, 4 = unwilling to allow palpation)  VASCULAR Dorsalis pedis and posterior tibial pulses are palpable   SPECIAL TESTS  Lumbar Radiculopathy SLUMP: R Negative, L Negative SLR: R Negative, L Negative  Prone Knee Bend: R Positive, L Negative   Meniscus Tests McMurray's Test:  Medial Meniscus (Tibial ER): R: Negative L: Negative Effusion: R: Negative L: Negative   Patellofemoral Pain Syndrome Patellar Tilt (Lateral): R: Not examined L: Not examined Patellar Apprehension: R: Negative L: Negative Squatting pain: R: Negative L: Negative Resisted knee extension pain: R: Negative L: Negative Compression: R: Not examined L: Not examined Clarke's sign: R: Not examined L: Not examined Lateral Pull: R: Negative L: Negative     TODAY'S TREATMENT:   Therapeutic Exercise - for HEP establishment, discussion on appropriate exercise/activity modification, PT education  Reviewed baseline home exercises and provided handout for [facility] program (see Access Code); tactile cueing and therapist demonstration utilized as needed for carryover of proper technique to HEP.    Patient education on current condition,  anatomy involved, prognosis, plan of care.     PATIENT EDUCATION:  Education details: see above for patient education details Person educated: Patient Education method: Explanation, Demonstration, and Handouts Education comprehension: verbalized understanding and returned demonstration   HOME EXERCISE PROGRAM:  Access Code: [email or web address] URL: [email or web address] Date: 03/07/2024 Prepared by: Consuela Mimes  Exercises - Prone Femoral Nerve Mobilization  - 3 x daily - 7 x weekly - 2 sets - 10 reps - 1-2sec hold - Supine Hamstring Stretch  - 3 x daily - 7 x weekly - 2 sets - 10 reps - 1-2sec hold - Supine Butterfly Groin Stretch  - 3 x daily - 7 x weekly - 3 sets - 30sec hold   ASSESSMENT:  CLINICAL IMPRESSION: Patient is a 28 y.o. female who was seen today for physical therapy evaluation and treatment for RLE pain with most symptoms localized to distomedial thigh/medial knee without classic numbness/tingling. Pt largely has negative testing for lumbar radiculopathy. Pt has minimal positive tests from provocative testing today; mild pain reproduced with prone knee bend. Pt will benefit from PT focused on neurodynamics to improve likely neuralgia given clinical presentation and S&S. Pt will continue to benefit from skilled PT services to address deficits and improve function.    OBJECTIVE IMPAIRMENTS: difficulty walking, decreased ROM, decreased strength, impaired flexibility, and pain.   ACTIVITY LIMITATIONS: lifting, standing, stairs, transfers, and locomotion level  PARTICIPATION LIMITATIONS: cleaning, shopping, and community activity  PERSONAL FACTORS: Past/current experiences, Time since onset of injury/illness/exacerbation, and 1-2 comorbidities: (Depression, Positive ANA)  are also affecting patient's functional outcome.   REHAB POTENTIAL: Good  CLINICAL DECISION MAKING: Unstable/unpredictable  EVALUATION COMPLEXITY: High   GOALS: Goals reviewed with patient?  Yes  SHORT TERM GOALS: Target date: 03/28/2024  Pt will be independent with HEP to improve strength and decrease knee pain to improve pain-free function at home and work. Baseline: 03/04/24: Baseline HEP initiated.  Goal status:  INITIAL   LONG TERM GOALS: Target date: 04/18/2024  Pt will have no nocturnal symptoms disturbing sleep as needed for improved sleep quality and long-term wellness Baseline: 03/04/24: Frequent symptoms which occur sporadically even when at rest.  Goal status: INITIAL  2.  Pt will decrease worst knee pain by at least 3 points on the NPRS in order to demonstrate clinically significant reduction in knee pain. Baseline: 03/04/24: 10/10 pain at worst.  Goal status: INITIAL  3.  Pt will decrease LEFS score by at least 9 points in order demonstrate clinically significant reduction in knee pain/disability.       Baseline: 03/04/24:  Goal status: INITIAL    PLAN: PT FREQUENCY: 1-2x/week  PT DURATION: 6 weeks  PLANNED INTERVENTIONS: Therapeutic exercises, Therapeutic activity, Neuromuscular re-education, Balance training, Gait training, Patient/Family education, Self Care, Joint mobilization, Joint manipulation, Vestibular training, Canalith repositioning, Orthotic/Fit training, DME instructions, Dry Needling, Electrical stimulation, Spinal manipulation, Spinal mobilization, Cryotherapy, Moist heat, Taping, Traction, Ultrasound, Ionotophoresis 4mg /ml Dexamethasone, Manual therapy, and Re-evaluation.  PLAN FOR NEXT SESSION: Neurodynamics, dry needling to be considered prn. Manual therapy for desensitization of affected tissues. Check further for proximal mm weakness (hip extensors, ABD/adductors).     Consuela Mimes, PT, DPT #[id number]  Gertie Exon, PT 03/04/2024, 9:54 AM

## 2024-03-07 ENCOUNTER — Encounter: Payer: Self-pay | Admitting: Physical Therapy

## 2024-03-08 ENCOUNTER — Ambulatory Visit: Admitting: Physical Therapy

## 2024-03-08 ENCOUNTER — Encounter: Payer: Self-pay | Admitting: Physical Therapy

## 2024-03-08 DIAGNOSIS — M79604 Pain in right leg: Secondary | ICD-10-CM

## 2024-03-08 DIAGNOSIS — R262 Difficulty in walking, not elsewhere classified: Secondary | ICD-10-CM

## 2024-03-08 NOTE — Therapy (Signed)
 OUTPATIENT PHYSICAL THERAPY TREATMENT  Patient Name: Rachel Ortega MRN: [id number] DOB:[birth year], 28 y.o., female Today's Date: 03/08/2024  END OF SESSION:  PT End of Session - 03/08/24 0857     Visit Number 2    Number of Visits 13    Date for PT Re-Evaluation 04/15/24    PT Start Time 0858    PT Stop Time 0940    PT Time Calculation (min) 42 min    Activity Tolerance Patient tolerated treatment well;No increased pain    Behavior During Therapy [facility] for tasks assessed/performed              Past Medical History:  Diagnosis Date   Depression    Medical history non-contributory    Preeclampsia, third trimester 02/05/2018   Past Surgical History:  Procedure Laterality Date   TONSILLECTOMY Bilateral    TONSILLECTOMY     WISDOM TOOTH EXTRACTION Bilateral    Patient Active Problem List   Diagnosis Date Noted   Obesity, Class I, BMI 30-34.9 02/19/2024   Enthesopathy of knee region (Right) 01/27/2024   Acute knee pain (Right) 01/07/2024   Medial knee pain (Right) 01/07/2024   Postdural puncture headache 11/18/2023   Postpartum spinal headache 11/18/2023   Anxiety due to invasive procedure 11/18/2023   NSVD (normal spontaneous vaginal delivery) 11/13/2023   Gestational diabetes 11/11/2023   Postpartum hemorrhage 11/11/2023   Spotting affecting pregnancy 09/22/2023   Vaginal bleeding during pregnancy 09/22/2023   SS-A antibody positive 09/22/2023   Obesity affecting pregnancy 09/22/2023   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, antepartum 09/22/2023   Rash and nonspecific skin eruption 09/19/2023   Anxiety and depression 07/14/2023   History of pre-eclampsia in prior pregnancy, currently pregnant 07/14/2023   Positive ANA (antinuclear antibody) 07/14/2023   Supervision of high risk pregnancy in second trimester 04/22/2023   Thrombocytopenia due to blood loss 02/07/2018   family H/O inherited metabolic disease 40/98/1191    PCP: Noralyn Pick, NP  REFERRING PROVIDER: Delano Metz, MD  REFERRING DIAG:  [phone] (ICD-10-CM) - Acute pain of right knee  M25.561 (ICD-10-CM) - Right medial knee pain  M76.891 (ICD-10-CM) - Enthesopathy of right knee region    RATIONALE FOR EVALUATION AND TREATMENT: Rehabilitation  THERAPY DIAG: Pain in right leg  Difficulty in walking, not elsewhere classified  ONSET DATE: 10/2023  FOLLOW-UP APPT SCHEDULED WITH REFERRING PROVIDER: Yes ; 03/09/24   PERTINENT HISTORY: Pt is a 28 year old female with primary c/o R knee/medial thigh/leg pain. Pt had injections on 02/19/24 in R knee that did not help. Patient reports negative experience with epidural, and she feels that her symptoms began after this. Patient reports burning "all of a sudden" pain that can go down to her foot. She states it is debilitating. Pt is taking Gabapentin for pain control. No significant integumentary change. Her child is 66 months old and she has 47-year-old. Pt reports doing well with childcare duties. Pt reports being unable to step on RLE when she is having episode of pain. Pt reports some nocturnal symptoms and even pain when at rest; sporadic onset of symptoms. Symptoms go away after 1-2 minutes. Pt is ANA positive; no active autoimmune disease.   PAIN:   Pain Intensity: Present: 0/10, Best: 0/10, Worst: 10/10 Pain location: anteromedial knee along medial peripatellar region  Pain quality: burning Radiating pain: Yes ; radiating from distomedial thigh down to dorsum of foot Swelling: No  Popping, catching, locking: No  Numbness/Tingling: No Focal weakness or  buckling: No Aggravating factors: Hurts sporadically without aggravating position/posture Relieving factors: Gabapentin, massage along distomedial thigh/VMO  24-hour pain behavior: None  History of prior back, hip, or knee injury, pain, surgery, or therapy: Yes; hx of back pain and chiropractic   Imaging: Yes ;  X-rays negative  Prior level of function:  Independent 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: Relaxing with family   Living Environment Lives with: lives with their partner, 65 y/o daughter, 71-month-old boy Lives in: House/apartment  Patient Goals: Knee pain gone; able to get off of Gabapentin    OBJECTIVE:   Patient Surveys  LEFS 12/80 = 15%  Gross Musculoskeletal Assessment Tremor: None Bulk: Normal Tone: Normal Pt squats with weight on forefoot and is able to maintain SLR well with control of pronation of ankles    GAIT: Distance walked: 50 ft Assistive device utilized: None Level of assistance: Complete Independence Comments: L>R ankle overpronation  AROM  Thoracolumbar AROM Flexion 75% (no pain) Extension WNL (no pain) Lateral flexion: R 50%, L 100% Rotation: R 100%, L 75%   AROM (Normal range in degrees) AROM 03/04/24  Hip Right Left  Flexion (125) WNL   Extension (15)    Abduction (40)    Adduction     Internal Rotation (45) WNL   External Rotation (45) WNL       Knee    Flexion (135) WNL   Extension (0) WNL       (* = pain; Blank rows = not tested)  LE MMT: MMT (out of 5) Right 03/04/24 Left 03/04/24  Hip flexion 4+ 4+  Hip extension 4 4  Hip abduction 4+ 5  Hip adduction    Hip internal rotation 5 5  Hip external rotation 4 4+  Knee flexion 5 5  Knee extension 5 5  Ankle dorsiflexion    Ankle plantarflexion    Ankle inversion    Ankle eversion    (* = pain; Blank rows = not tested)  Reflexes R/L Knee Jerk (L3/4): 2+/2+ Ankle Jerk (S1/2): 2+/2+ Clonus: Negative bilat  Muscle Length Hamstrings: R: Positive L: Positive Quadriceps (Ely): R: Positive L: Positive  Palpation TTP pes anserinus 1, adductor longus 1 Graded on 0-4 scale (0 = no pain, 1 = pain, 2 = pain with wincing/grimacing/flinching, 3 = pain with withdrawal, 4 = unwilling to allow palpation)  VASCULAR Dorsalis pedis and posterior tibial pulses are palpable   SPECIAL TESTS  Lumbar  Radiculopathy SLUMP: R Negative, L Negative SLR: R Negative, L Negative  Prone Knee Bend: R Positive, L Negative  Meniscus Tests McMurray's Test:  Medial Meniscus (Tibial ER): R: Negative L: Negative Effusion: R: Negative L: Negative   Patellofemoral Pain Syndrome Patellar Tilt (Lateral): R: Not examined L: Not examined Patellar Apprehension: R: Negative L: Negative Squatting pain: R: Negative L: Negative Resisted knee extension pain: R: Negative L: Negative Compression: R: Not examined L: Not examined Clarke's sign: R: Not examined L: Not examined Lateral Pull: R: Negative L: Negative     TODAY'S TREATMENT:   SUBJECTIVE STATEMENT:   Patient reports feeling well at arrival. She reports notable episode Friday night of burning/pain. Patient reports no notable sensitivity at baseline this AM.     Manual Therapy - for symptom modulation, soft tissue sensitivity and mobility, joint mobility, ROM   STM along R vastus medialis and adductor longus/gracilis x 15 minutes    Trigger Point Dry Needling  Initial Treatment: Pt instructed on Dry Needling rational, procedures, and possible side effects. Pt  instructed to expect mild to moderate muscle soreness later in the day and/or into the next day.  Pt instructed to continue prescribed HEP. Patient verbalized understanding of these instructions and education.   Patient Verbal Consent Given: Yes Education Handout Provided: Yes Muscles Treated: R adductor longus and R vastus medialis with 0.30 x 60 single needle placements Electrical Stimulation Performed: No Treatment Response/Outcome: "Feels looser" and no significant post-treatment pain    Therapeutic Exercise - for improved soft tissue flexibility and extensibility as needed for ROM, neurodynamics to improve neural tension and reduce neuralgias/paresthesias  Prone femoral nerve flossing; 20x Prone femoral nerve tensioner; 20x Supine active hamstring stretch; 20x  Supine  hip abduction stretch; 3 x 30 sec  PATIENT EDUCATION: Discussed expectations following treatment and encouraged continued HEP with addition of prone femoral tensioners as long as pt is not having increased neuralgias.    PATIENT EDUCATION:  Education details: see above for patient education details Person educated: Patient Education method: Explanation, Demonstration, and Handouts Education comprehension: verbalized understanding and returned demonstration   HOME EXERCISE PROGRAM:  Access Code: [email or web address] URL: [email or web address] Date: 03/07/2024 Prepared by: Consuela Mimes  Exercises - Prone Femoral Nerve Mobilization  - 3 x daily - 7 x weekly - 2 sets - 10 reps - 1-2sec hold - Supine Hamstring Stretch  - 3 x daily - 7 x weekly - 2 sets - 10 reps - 1-2sec hold - Supine Butterfly Groin Stretch  - 3 x daily - 7 x weekly - 3 sets - 30sec hold   ASSESSMENT:  CLINICAL IMPRESSION: Patient has minimal symptoms this AM. She had notable pain episode Friday night, but she did relatively well between Saturday and this morning. Pt has concordant symptoms with deep palpation and dry needling of adductor along distomedial thigh. DN was utilized for symptom modulation and central/peripheral desensitization. Pt tolerates treatment well today with inner thigh feeling "more loose" following MT/needling. Pt will continue to benefit from skilled PT services to address deficits and improve function.    OBJECTIVE IMPAIRMENTS: difficulty walking, decreased ROM, decreased strength, impaired flexibility, and pain.   ACTIVITY LIMITATIONS: lifting, standing, stairs, transfers, and locomotion level  PARTICIPATION LIMITATIONS: cleaning, shopping, and community activity  PERSONAL FACTORS: Past/current experiences, Time since onset of injury/illness/exacerbation, and 1-2 comorbidities: (Depression, Positive ANA)  are also affecting patient's functional outcome.   REHAB POTENTIAL:  Good  CLINICAL DECISION MAKING: Unstable/unpredictable  EVALUATION COMPLEXITY: High   GOALS: Goals reviewed with patient? Yes  SHORT TERM GOALS: Target date: 03/28/2024  Pt will be independent with HEP to improve strength and decrease knee pain to improve pain-free function at home and work. Baseline: 03/04/24: Baseline HEP initiated.  Goal status: INITIAL   LONG TERM GOALS: Target date: 04/18/2024  Pt will have no nocturnal symptoms disturbing sleep as needed for improved sleep quality and long-term wellness Baseline: 03/04/24: Frequent symptoms which occur sporadically even when at rest.  Goal status: INITIAL  2.  Pt will decrease worst knee pain by at least 3 points on the NPRS in order to demonstrate clinically significant reduction in knee pain. Baseline: 03/04/24: 10/10 pain at worst.  Goal status: INITIAL  3.  Pt will decrease LEFS score by at least 9 points in order demonstrate clinically significant reduction in knee pain/disability.       Baseline: 03/04/24:  Goal status: INITIAL    PLAN: PT FREQUENCY: 1-2x/week  PT DURATION: 6 weeks  PLANNED INTERVENTIONS: Therapeutic exercises, Therapeutic activity, Neuromuscular re-education, Balance training, Gait training, Patient/Family  education, Self Care, Joint mobilization, Joint manipulation, Vestibular training, Canalith repositioning, Orthotic/Fit training, DME instructions, Dry Needling, Electrical stimulation, Spinal manipulation, Spinal mobilization, Cryotherapy, Moist heat, Taping, Traction, Ultrasound, Ionotophoresis 4mg /ml Dexamethasone, Manual therapy, and Re-evaluation.  PLAN FOR NEXT SESSION: Neurodynamics; soft tissue stretching for adductors/quads. Manual therapy for desensitization of affected tissues. F/u on dry needling response.     Consuela Mimes, PT, DPT #[id number]  Gertie Exon, PT 03/08/2024, 8:58 AM

## 2024-03-08 NOTE — Progress Notes (Signed)
 PROVIDER NOTE: Information contained herein reflects review and annotations entered in association with encounter. Interpretation of such information and data should be left to medically-trained personnel. Information provided to patient can be located elsewhere in the medical record under "Patient Instructions". Document created using STT-dictation technology, any transcriptional errors that may result from process are unintentional.    Patient: Rachel Ortega  Service Category: E/M  Provider: Oswaldo Done, MD  DOB: [birth year]  DOS: 03/09/2024  Referring Provider: Noralyn Pick, NP  MRN: [id number]  Specialty: Interventional Pain Management  PCP: Noralyn Pick, NP  Type: Established Patient  Setting: Ambulatory outpatient    Location: Office  Delivery: Face-to-face     HPI  [name], a 28 y.o. year old female, is here today because of her Acute pain of right knee [M25.561]. [name] primary complain today is Knee Pain (right)  Pertinent problems: [name] has Positive ANA (antinuclear antibody); SS-A antibody positive; Postdural puncture headache; Postpartum spinal headache; Acute knee pain (Right); Medial knee pain (Right); Enthesopathy of knee region (Right); Right lumbar radiculitis; Chronic bilateral low back pain with right-sided sciatica; and Transient weakness of right lower extremity on their pertinent problem list. Pain Assessment: Severity of Chronic pain is reported as a 5 /10. Location: Knee Right/radiates through whole leg and whole foot. Onset: More than a month ago. Quality: Burning, Sharp. Timing: Intermittent. Modifying factor(s): meds. Vitals:  height is 5\' 6"  (1.676 m) and weight is 206 lb (93.4 kg). Her temperature is 97.4 F (36.3 C) (abnormal). Her blood pressure is 121/83 and her pulse is 102 (abnormal). Her respiration is 18 and oxygen saturation is 99%.  BMI: Estimated body mass index is 33.25 kg/m as calculated from the following:   Height as of this  encounter: 5\' 6"  (1.676 m).   Weight as of this encounter: 206 lb (93.4 kg). Last encounter: 01/28/2024. Last procedure: 02/19/2024.  Reason for encounter: post-procedure evaluation and assessment.  Discussed the use of AI scribe software for clinical note transcription with the patient, who gave verbal consent to proceed.  History of Present Illness   Rachel Ortega is a 28 year old female who presents with persistent sharp shooting pain in the right knee radiating to the foot.  She experiences persistent sharp shooting pain in the medial aspect of the right knee, which sometimes radiates down to the top and bottom of her foot. The pain is debilitating, occurring randomly, waking her from sleep, and affecting her ability to walk during episodes. It is not associated with any specific movement or position, as it can occur while cooking, sitting, or even sleeping.  She has previously received a knee injection, which alleviated the achy pain but did not affect the sharp shooting pain. Gabapentin has been effective in controlling the sharp pain, although she reports a side effect of memory issues. She has also tried a steroid taper, which did not provide relief.  She experiences constant back pain but denies having the sharp shooting pain in her back. She has not undergone an MRI but has had x-rays, including flexion and extension views, which have not identified the source of her symptoms.  She is concerned about the long-term use of gabapentin due to its side effects, particularly memory issues. No current leg weakness, but she reports inability to walk during pain episodes.      Post-procedure evaluation   Type:  Steroid Intra-articular Knee Injection #1 (diagnostic) Laterality: Right (-RT) Level/approach: Lateral (Had recently  scrapped the medial aspect while [place]) Imaging guidance: None required (CPT-20610) Anesthesia: Local anesthesia (1-2% Lidocaine) Anxiolysis: None                  Sedation: No Sedation                       DOS: 02/19/2024  Performed by: Oswaldo Done, MD  Purpose: Diagnostic/Therapeutic Indications: Knee arthralgia associated to osteoarthritis of the knee 1. Acute knee pain (Right)   2. Medial knee pain (Right)   3. Enthesopathy of knee region (Right)   4. Obesity, Class I, BMI 30-34.9    NAS-11 score:   Pre-procedure: 10-Worst pain ever/10   Post-procedure: 10-Worst pain ever/10     Effectiveness:  Initial hour after procedure: 0 %. Subsequent 4-6 hours post-procedure: 0 %. Analgesia past initial 6 hours: 0 %. Ongoing improvement:  Analgesic: The patient indicates not having attained any significant pain from the electrical-like sensations that she was experiencing over the right knee.  She refers that she has noticed this pain to extend now all the way down to the top and medial aspect of her foot suggesting the possibility that we may be dealing with a radiculitis.  Evidence to support this is the fact that the gabapentin that she takes actually does help with that pain. Function: No benefit ROM: No benefit   Pharmacotherapy Assessment  Analgesic: No chronic opioid analgesics therapy prescribed by our practice. Oxycodone/APAP 5/325, 1 tab p.o. 4 times daily (# 12) (last filled on 11/16/2023) MME/day: 30 mg/day   Monitoring: [place] PMP: PDMP reviewed during this encounter.       Pharmacotherapy: No side-effects or adverse reactions reported. Compliance: No problems identified. Effectiveness: Clinically acceptable.  Valerie Salts, RN  03/09/2024  2:04 PM  Sign when Signing Visit Safety precautions to be maintained throughout the outpatient stay will include: orient to surroundings, keep bed in low position, maintain call bell within reach at all times, provide assistance with transfer out of bed and ambulation.    No results found for: "CBDTHCR" No results found for: "D8THCCBX" No results found for: "D9THCCBX"  UDS:  No results  found for: "SUMMARY"    ROS  Constitutional: Denies any fever or chills Gastrointestinal: No reported hemesis, hematochezia, vomiting, or acute GI distress Musculoskeletal: Denies any acute onset joint swelling, redness, loss of ROM, or weakness Neurological: No reported episodes of acute onset apraxia, aphasia, dysarthria, agnosia, amnesia, paralysis, loss of coordination, or loss of consciousness  Medication Review  EPINEPHrine, Vitamin D (Ergocalciferol), acetaminophen, baclofen, doxycycline, doxylamine (Sleep), gabapentin, ibuprofen, ivermectin, norelgestromin-ethinyl estradiol, valACYclovir, and venlafaxine  History Review  Allergy: [name] is allergic to codeine, shrimp (diagnostic), hydrocod poli-chlorphe poli er, and sulfa antibiotics. Drug: [name]  reports no history of drug use. Alcohol:  reports that she does not currently use alcohol. Tobacco:  reports that she has never smoked. She has never used smokeless tobacco. Social: Ms. Skilton  reports that she has never smoked. She has never used smokeless tobacco. She reports that she does not currently use alcohol. She reports that she does not use drugs. Medical:  has a past medical history of Depression, Medical history non-contributory, and Preeclampsia, third trimester (02/05/2018). Surgical: [name]  has a past surgical history that includes Tonsillectomy (Bilateral); Wisdom tooth extraction (Bilateral); and Tonsillectomy. Family: family history includes Cancer in her maternal grandfather; Healthy in her father; Lupus in her mother; Rheum arthritis in her mother.  Laboratory Chemistry Profile   Renal Lab Results  Component Value Date   BUN 9 11/15/2023   CREATININE 0.56 11/15/2023   LABCREA 97 02/05/2018   GFRAA >60 02/06/2018   GFRNONAA >60 11/15/2023    Hepatic Lab Results  Component Value Date   AST 26 11/15/2023   ALT 21 11/15/2023   ALBUMIN 3.0 (L) 11/15/2023   ALKPHOS 69 11/15/2023    Electrolytes Lab  Results  Component Value Date   NA 141 11/15/2023   K 3.3 (L) 11/15/2023   CL 108 11/15/2023   CALCIUM 8.4 (L) 11/15/2023    Bone No results found for: "VD25OH", "VD125OH2TOT", "WU9811BJ4", "NW2956OZ3", "25OHVITD1", "25OHVITD2", "25OHVITD3", "TESTOFREE", "TESTOSTERONE"  Inflammation (CRP: Acute Phase) (ESR: Chronic Phase) No results found for: "CRP", "ESRSEDRATE", "LATICACIDVEN"       Note: Above Lab results reviewed.  Recent Imaging Review  DG Knee Complete 4 Views Right CLINICAL DATA:  Right knee pain/arthralgia. Acute. Positive ANA antinuclear antibody and SS-A antibody positive.  EXAM: RIGHT KNEE - COMPLETE 4+ VIEW  COMPARISON:  None Available.  FINDINGS: Normal bone mineralization. Joint spaces are preserved. No joint effusion. No acute fracture or dislocation. No cortical erosion.  IMPRESSION: Normal right knee radiographs.  Electronically Signed   By: Neita Garnet M.D.   On: 01/07/2024 22:51 Note: Reviewed        Physical Exam  General appearance: Well nourished, well developed, and well hydrated. In no apparent acute distress Mental status: Alert, oriented x 3 (person, place, & time)       Respiratory: No evidence of acute respiratory distress Eyes: PERLA Vitals: BP 121/83   Pulse (!) 102   Temp (!) 97.4 F (36.3 C)   Resp 18   Ht 5\' 6"  (1.676 m)   Wt 206 lb (93.4 kg)   LMP 02/03/2024   SpO2 99%   BMI 33.25 kg/m  BMI: Estimated body mass index is 33.25 kg/m as calculated from the following:   Height as of this encounter: 5\' 6"  (1.676 m).   Weight as of this encounter: 206 lb (93.4 kg). Ideal: Ideal body weight: 59.3 kg (130 lb 11.7 oz) Adjusted ideal body weight: 73 kg (160 lb 13.4 oz)  Assessment   Diagnosis Status  1. Acute knee pain (Right)   2. Medial knee pain (Right)   3. Enthesopathy of knee region (Right)   4. Postop check   5. Right lumbar radiculitis   6. Chronic bilateral low back pain with right-sided sciatica   7. Transient  weakness of right lower extremity    Controlled Controlled Controlled   Updated Problems: Problem  Right Lumbar Radiculitis  Chronic Bilateral Low Back Pain With Right-Sided Sciatica  Transient Weakness of Right Lower Extremity    Plan of Care  Problem-specific:  Assessment and Plan    Right leg radicular pain   She experiences sharp, shooting pain in the medial aspect of the right knee, radiating to the foot. The pain is random, debilitating, and sometimes disrupts sleep. Lack of relief from knee injections suggests a non-knee origin. Partial relief with gabapentin indicates a neuropathic component. Differential diagnosis includes lumbar radiculopathy, possibly due to disc herniation affecting L4 or L5 nerve roots. She reports chronic back pain without sharp back pain. No current leg weakness, but pain can prevent walking. Order MRI of the lumbar spine to assess for nerve root compression or other abnormalities. Review MRI results with her to determine next steps based on findings. Discuss potential long-term use of gabapentin  and its side effects, including memory issues. Consider physical therapy focusing on exercise, stretching, and strengthening if MRI is unremarkable.       Rachel Ortega has a current medication list which includes the following long-term medication(s): gabapentin, norelgestromin-ethinyl estradiol, and venlafaxine.  Pharmacotherapy (Medications Ordered): No orders of the defined types were placed in this encounter.  Orders:  Orders Placed This Encounter  Procedures   MR LUMBAR SPINE WO CONTRAST    Patient presents with axial pain with possible radicular component. Please assist [place] in identifying specific level(s) and laterality of any additional findings such as: 1. Facet (Zygapophyseal) joint DJD (Hypertrophy, space narrowing, subchondral sclerosis, and/or osteophyte formation) 2. DDD and/or IVDD (Loss of disc height, desiccation, gas patterns, osteophytes,  endplate sclerosis, or "Black disc disease") 3. Pars defects 4. Spondylolisthesis, spondylosis, and/or spondyloarthropathies (include Degree/Grade of displacement in mm) (stability) 5. Vertebral body Fractures (acute/chronic) (state percentage of collapse) 6. Demineralization (osteopenia/osteoporotic) 7. Bone pathology 8. Foraminal narrowing  9. Surgical changes 10. Central, Lateral Recess, and/or Foraminal Stenosis (include AP diameter of stenosis in mm) 11. Surgical changes (hardware type, status, and presence of fibrosis) 12. Modic Type Changes (MRI only) 13. IVDD (Disc bulge, protrusion, herniation, extrusion) (Level, laterality, extent)    Standing Status:   Future    Expiration Date:   06/09/2024    Scheduling Instructions:     Please make sure that the patient understands that this needs to be done as soon as possible. Never have the patient do the imaging "just before the next appointment". Inform patient that having the imaging done within the [facility] will expedite the availability of the results and will provide      imaging availability to the requesting physician. In addition inform the patient that the imaging order has an expiration date and will not be renewed if not done within the active period.    What is the patient's sedation requirement?:   No Sedation    Does the patient have a pacemaker or implanted devices?:   No    Preferred imaging location?:   [facility] (table limit-350lbs)    Call Results- Best Contact Number?:   [phone] [facility]    Radiology Contrast Protocol - do NOT remove file path:   [email or web address]   Nursing Instructions:    Please complete this patient's postprocedure evaluation.    Scheduling Instructions:     Please complete this patient's postprocedure evaluation.   Follow-up plan:   Return if symptoms worsen or fail to improve, for for review of  ordered tests.      Interventional Therapies  Risk Factors  Considerations  Medical Comorbidities:     Planned  Pending:   Diagnostic right IA steroid knee joint injection #1    Under consideration:   Possible physical therapy for knee pain should she continue to have pain after steroid taper. Possible right knee MRI should she continue having the pain despite the steroid taper and the physical therapy. Possible diagnostic right knee injections should all the above fails and pathology is found on the right knee MRI.   Completed:   Therapeutic Lumbar epidural blood patch x1 (11/18/2023) (100/100/100/100)   Therapeutic  Palliative (PRN) options:   None established   Completed by other providers:   None reported     Recent Visits Date Type Provider Dept  02/19/24 Procedure visit Delano Metz, MD [facility]  01/28/24 Office Visit Delano Metz, MD  [facility]  01/07/24 Office Visit Delano Metz, MD [facility]  Showing recent visits within past 90 days and meeting all other requirements Today's Visits Date Type Provider Dept  03/09/24 Office Visit Delano Metz, MD [facility]  Showing today's visits and meeting all other requirements Future Appointments No visits were found meeting these conditions. Showing future appointments within next 90 days and meeting all other requirements  I discussed the assessment and treatment plan with the patient. The patient was provided an opportunity to ask questions and all were answered. The patient agreed with the plan and demonstrated an understanding of the instructions.  Patient advised to call back or seek an in-person evaluation if the symptoms or condition worsens.  Duration of encounter: 30 minutes.  Total time on encounter, as per AMA guidelines included both the face-to-face and non-face-to-face time personally spent by the physician and/or other qualified health  care professional(s) on the day of the encounter (includes time in activities that require the physician or other qualified health care professional and does not include time in activities normally performed by clinical staff). Physician's time may include the following activities when performed: Preparing to see the patient (e.g., pre-charting review of records, searching for previously ordered imaging, lab work, and nerve conduction tests) Review of prior analgesic pharmacotherapies. Reviewing PMP Interpreting ordered tests (e.g., lab work, imaging, nerve conduction tests) Performing post-procedure evaluations, including interpretation of diagnostic procedures Obtaining and/or reviewing separately obtained history Performing a medically appropriate examination and/or evaluation Counseling and educating the patient/family/caregiver Ordering medications, tests, or procedures Referring and communicating with other health care professionals (when not separately reported) Documenting clinical information in the electronic or other health record Independently interpreting results (not separately reported) and communicating results to the patient/ family/caregiver Care coordination (not separately reported)  Note by: Oswaldo Done, MD Date: 03/09/2024; Time: 2:34 PM

## 2024-03-09 ENCOUNTER — Ambulatory Visit: Payer: Medicaid Other | Attending: Pain Medicine | Admitting: Pain Medicine

## 2024-03-09 ENCOUNTER — Encounter: Payer: Self-pay | Admitting: Pain Medicine

## 2024-03-09 VITALS — BP 121/83 | HR 102 | Temp 97.4°F | Resp 18 | Ht 66.0 in | Wt 206.0 lb

## 2024-03-09 DIAGNOSIS — M76891 Other specified enthesopathies of right lower limb, excluding foot: Secondary | ICD-10-CM | POA: Diagnosis present

## 2024-03-09 DIAGNOSIS — M5441 Lumbago with sciatica, right side: Secondary | ICD-10-CM | POA: Diagnosis present

## 2024-03-09 DIAGNOSIS — Z09 Encounter for follow-up examination after completed treatment for conditions other than malignant neoplasm: Secondary | ICD-10-CM | POA: Diagnosis present

## 2024-03-09 DIAGNOSIS — M5416 Radiculopathy, lumbar region: Secondary | ICD-10-CM | POA: Insufficient documentation

## 2024-03-09 DIAGNOSIS — M25561 Pain in right knee: Secondary | ICD-10-CM | POA: Insufficient documentation

## 2024-03-09 DIAGNOSIS — R29898 Other symptoms and signs involving the musculoskeletal system: Secondary | ICD-10-CM | POA: Insufficient documentation

## 2024-03-09 DIAGNOSIS — G8929 Other chronic pain: Secondary | ICD-10-CM | POA: Insufficient documentation

## 2024-03-09 NOTE — Progress Notes (Signed)
 Safety precautions to be maintained throughout the outpatient stay will include: orient to surroundings, keep bed in low position, maintain call bell within reach at all times, provide assistance with transfer out of bed and ambulation.

## 2024-03-10 ENCOUNTER — Telehealth: Payer: Self-pay

## 2024-03-10 NOTE — Telephone Encounter (Signed)
 Her insurance, medicaid, requires xrays and PT before approving an MRI. She has had 2 PT visits so far, we will probably have to wait until she has had at least 4 and we will need an xray as well. Please order xray and I will submit once she has had that done.

## 2024-03-11 ENCOUNTER — Encounter: Payer: Self-pay | Admitting: Physical Therapy

## 2024-03-11 ENCOUNTER — Ambulatory Visit: Payer: Medicaid Other | Admitting: Physical Therapy

## 2024-03-11 DIAGNOSIS — M79604 Pain in right leg: Secondary | ICD-10-CM

## 2024-03-11 DIAGNOSIS — R262 Difficulty in walking, not elsewhere classified: Secondary | ICD-10-CM

## 2024-03-11 NOTE — Therapy (Unsigned)
 OUTPATIENT PHYSICAL THERAPY TREATMENT  Patient Name: Rachel Ortega MRN: [id number] DOB:Oct 21, 1996, 28 y.o., female Today's Date: 03/11/2024  END OF SESSION:  PT End of Session - 03/11/24 0943     Visit Number 3    Number of Visits 13    Date for PT Re-Evaluation 04/15/24    PT Start Time 0947    PT Stop Time 1027    PT Time Calculation (min) 40 min    Activity Tolerance Patient tolerated treatment well;No increased pain    Behavior During Therapy [facility] for tasks assessed/performed               Past Medical History:  Diagnosis Date   Depression    Medical history non-contributory    Preeclampsia, third trimester 02/05/2018   Past Surgical History:  Procedure Laterality Date   TONSILLECTOMY Bilateral    TONSILLECTOMY     WISDOM TOOTH EXTRACTION Bilateral    Patient Active Problem List   Diagnosis Date Noted   Right lumbar radiculitis 03/09/2024   Chronic bilateral low back pain with right-sided sciatica 03/09/2024   Transient weakness of right lower extremity 03/09/2024   Obesity, Class I, BMI 30-34.9 02/19/2024   Enthesopathy of knee region (Right) 01/27/2024   Acute knee pain (Right) 01/07/2024   Medial knee pain (Right) 01/07/2024   Postdural puncture headache 11/18/2023   Postpartum spinal headache 11/18/2023   Anxiety due to invasive procedure 11/18/2023   NSVD (normal spontaneous vaginal delivery) 11/13/2023   Gestational diabetes 11/11/2023   Postpartum hemorrhage 11/11/2023   Spotting affecting pregnancy 09/22/2023   Vaginal bleeding during pregnancy 09/22/2023   SS-A antibody positive 09/22/2023   Obesity affecting pregnancy 09/22/2023   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, antepartum 09/22/2023   Rash and nonspecific skin eruption 09/19/2023   Anxiety and depression 07/14/2023   History of pre-eclampsia in prior pregnancy, currently pregnant 07/14/2023   Positive ANA (antinuclear antibody) 07/14/2023   Supervision of high risk  pregnancy in second trimester 04/22/2023   Thrombocytopenia due to blood loss 02/07/2018   family H/O inherited metabolic disease 40/98/1191    PCP: Noralyn Pick, NP  REFERRING PROVIDER: Delano Metz, MD  REFERRING DIAG:  [phone] (ICD-10-CM) - Acute pain of right knee  M25.561 (ICD-10-CM) - Right medial knee pain  M76.891 (ICD-10-CM) - Enthesopathy of right knee region    RATIONALE FOR EVALUATION AND TREATMENT: Rehabilitation  THERAPY DIAG: Pain in right leg  Difficulty in walking, not elsewhere classified  ONSET DATE: 10/2023  FOLLOW-UP APPT SCHEDULED WITH REFERRING PROVIDER: Yes ; 03/09/24   PERTINENT HISTORY: Pt is a 28 year old female with primary c/o R knee/medial thigh/leg pain. Pt had injections on 02/19/24 in R knee that did not help. Patient reports negative experience with epidural, and she feels that her symptoms began after this. Patient reports burning "all of a sudden" pain that can go down to her foot. She states it is debilitating. Pt is taking Gabapentin for pain control. No significant integumentary change. Her child is 19 months old and she has 52-year-old. Pt reports doing well with childcare duties. Pt reports being unable to step on RLE when she is having episode of pain. Pt reports some nocturnal symptoms and even pain when at rest; sporadic onset of symptoms. Symptoms go away after 1-2 minutes. Pt is ANA positive; no active autoimmune disease.   PAIN:   Pain Intensity: Present: 0/10, Best: 0/10, Worst: 10/10 Pain location: anteromedial knee along medial peripatellar region  Pain quality:  burning Radiating pain: Yes ; radiating from distomedial thigh down to dorsum of foot Swelling: No  Popping, catching, locking: No  Numbness/Tingling: No Focal weakness or buckling: No Aggravating factors: Hurts sporadically without aggravating position/posture Relieving factors: Gabapentin, massage along distomedial thigh/VMO  24-hour pain behavior:  None  History of prior back, hip, or knee injury, pain, surgery, or therapy: Yes; hx of back pain and chiropractic   Imaging: Yes ;  X-rays negative  Prior level of function: Independent 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: Relaxing with family   Living Environment Lives with: lives with their partner, 15 y/o daughter, 51-month-old boy Lives in: House/apartment  Patient Goals: Knee pain gone; able to get off of Gabapentin    OBJECTIVE:   Patient Surveys  LEFS 12/80 = 15%  Gross Musculoskeletal Assessment Tremor: None Bulk: Normal Tone: Normal Pt squats with weight on forefoot and is able to maintain SLR well with control of pronation of ankles    GAIT: Distance walked: 50 ft Assistive device utilized: None Level of assistance: Complete Independence Comments: L>R ankle overpronation  AROM  Thoracolumbar AROM Flexion 75% (no pain) Extension WNL (no pain) Lateral flexion: R 50%, L 100% Rotation: R 100%, L 75%   AROM (Normal range in degrees) AROM 03/04/24  Hip Right Left  Flexion (125) WNL   Extension (15)    Abduction (40)    Adduction     Internal Rotation (45) WNL   External Rotation (45) WNL       Knee    Flexion (135) WNL   Extension (0) WNL       (* = pain; Blank rows = not tested)  LE MMT: MMT (out of 5) Right 03/04/24 Left 03/04/24  Hip flexion 4+ 4+  Hip extension 4 4  Hip abduction 4+ 5  Hip adduction    Hip internal rotation 5 5  Hip external rotation 4 4+  Knee flexion 5 5  Knee extension 5 5  Ankle dorsiflexion    Ankle plantarflexion    Ankle inversion    Ankle eversion    (* = pain; Blank rows = not tested)  Reflexes R/L Knee Jerk (L3/4): 2+/2+ Ankle Jerk (S1/2): 2+/2+ Clonus: Negative bilat  Muscle Length Hamstrings: R: Positive L: Positive Quadriceps (Ely): R: Positive L: Positive  Palpation TTP pes anserinus 1, adductor longus 1 Graded on 0-4 scale (0 = no pain, 1 = pain, 2 = pain with  wincing/grimacing/flinching, 3 = pain with withdrawal, 4 = unwilling to allow palpation)  VASCULAR Dorsalis pedis and posterior tibial pulses are palpable   SPECIAL TESTS  Lumbar Radiculopathy SLUMP: R Negative, L Negative SLR: R Negative, L Negative  Prone Knee Bend: R Positive, L Negative  Meniscus Tests McMurray's Test:  Medial Meniscus (Tibial ER): R: Negative L: Negative Effusion: R: Negative L: Negative   Patellofemoral Pain Syndrome Patellar Tilt (Lateral): R: Not examined L: Not examined Patellar Apprehension: R: Negative L: Negative Squatting pain: R: Negative L: Negative Resisted knee extension pain: R: Negative L: Negative Compression: R: Not examined L: Not examined Clarke's sign: R: Not examined L: Not examined Lateral Pull: R: Negative L: Negative     TODAY'S TREATMENT:   SUBJECTIVE STATEMENT:   Patient reports no flares since last PT follow-up. Symptoms are under control with Gabapentin. Patient reports no relief from intra-articular steroid injections to R knee. Patient reports compliance with her HEP. Patient reports no symptoms at the moment at arrival to PT.     Manual Therapy -  for symptom modulation, soft tissue sensitivity and mobility, joint mobility, ROM   STM along R vastus medialis and adductor longus/gracilis x 10 minutes    Trigger Point Dry Needling  Subsequent Treatment: Instructions provided previously at initial dry needling treatment.   Patient Verbal Consent Given: Yes Education Handout Provided: Yes Muscles Treated: R adductor longus and R vastus medialis x 2 with 0.30 x 60 mm single needle placements Electrical Stimulation Performed: Yes, 2 0.30 x 60 mm needles in vastus medialis; Parameters: 1.5 milliamps at 5 Hz x 7 min, 2 microamps at 80 Hz Treatment Response/Outcome: "Feels looser" and no significant post-treatment pain    Therapeutic Exercise - for improved soft tissue flexibility and extensibility as needed for ROM,  neurodynamics to improve neural tension and reduce neuralgias/paresthesias  Prone femoral nerve flossing; 20x Prone femoral nerve tensioner; 20x  Quadruped donkey kick; 2 x 10 on either side  Glute bridge; 2x10   PATIENT EDUCATION: Discussed expectations following treatment and encouraged continued HEP with addition of prone femoral tensioners as long as pt is not having increased neuralgias. Discussed increased hydration following DN.    *not today* Supine hip abduction stretch; 3 x 30 sec Supine active hamstring stretch; 20x  PATIENT EDUCATION:  Education details: see above for patient education details Person educated: Patient Education method: Explanation, Demonstration, and Handouts Education comprehension: verbalized understanding and returned demonstration   HOME EXERCISE PROGRAM:  Access Code: [email or web address] URL: [email or web address] Date: 03/07/2024 Prepared by: Consuela Mimes  Exercises - Prone Femoral Nerve Mobilization  - 3 x daily - 7 x weekly - 2 sets - 10 reps - 1-2sec hold - Supine Hamstring Stretch  - 3 x daily - 7 x weekly - 2 sets - 10 reps - 1-2sec hold - Supine Butterfly Groin Stretch  - 3 x daily - 7 x weekly - 3 sets - 30sec hold   ASSESSMENT:  CLINICAL IMPRESSION: Patient fortunately has not experienced significant flare-up since first treatment this week, though her symptoms are sporadic and can occur with severe intensity. We utilized dry needling with e-stim today for further symptom modulation and neuromodulation along with continued neurodynamics and stretching program to address neuralgias/leg pain. We will continue to monitor for any future flare-ups. Pt will continue to benefit from skilled PT services to address deficits and improve function.    OBJECTIVE IMPAIRMENTS: difficulty walking, decreased ROM, decreased strength, impaired flexibility, and pain.   ACTIVITY LIMITATIONS: lifting, standing, stairs, transfers, and locomotion  level  PARTICIPATION LIMITATIONS: cleaning, shopping, and community activity  PERSONAL FACTORS: Past/current experiences, Time since onset of injury/illness/exacerbation, and 1-2 comorbidities: (Depression, Positive ANA)  are also affecting patient's functional outcome.   REHAB POTENTIAL: Good  CLINICAL DECISION MAKING: Unstable/unpredictable  EVALUATION COMPLEXITY: High   GOALS: Goals reviewed with patient? Yes  SHORT TERM GOALS: Target date: 03/28/2024  Pt will be independent with HEP to improve strength and decrease knee pain to improve pain-free function at home and work. Baseline: 03/04/24: Baseline HEP initiated.  Goal status: INITIAL   LONG TERM GOALS: Target date: 04/18/2024  Pt will have no nocturnal symptoms disturbing sleep as needed for improved sleep quality and long-term wellness Baseline: 03/04/24: Frequent symptoms which occur sporadically even when at rest.  Goal status: INITIAL  2.  Pt will decrease worst knee pain by at least 3 points on the NPRS in order to demonstrate clinically significant reduction in knee pain. Baseline: 03/04/24: 10/10 pain at worst.  Goal status: INITIAL  3.  Pt will decrease  LEFS score by at least 9 points in order demonstrate clinically significant reduction in knee pain/disability.       Baseline: 03/04/24:  Goal status: INITIAL    PLAN: PT FREQUENCY: 1-2x/week  PT DURATION: 6 weeks  PLANNED INTERVENTIONS: Therapeutic exercises, Therapeutic activity, Neuromuscular re-education, Balance training, Gait training, Patient/Family education, Self Care, Joint mobilization, Joint manipulation, Vestibular training, Canalith repositioning, Orthotic/Fit training, DME instructions, Dry Needling, Electrical stimulation, Spinal manipulation, Spinal mobilization, Cryotherapy, Moist heat, Taping, Traction, Ultrasound, Ionotophoresis 4mg /ml Dexamethasone, Manual therapy, and Re-evaluation.  PLAN FOR NEXT SESSION: Neurodynamics; soft tissue  stretching for adductors/quads. Manual therapy for desensitization of affected tissues. F/u on dry needling response.     Consuela Mimes, PT, DPT #[id number]  Gertie Exon, PT 03/11/2024, 9:47 AM

## 2024-03-15 ENCOUNTER — Other Ambulatory Visit: Payer: Self-pay | Admitting: Pain Medicine

## 2024-03-15 DIAGNOSIS — G8929 Other chronic pain: Secondary | ICD-10-CM | POA: Insufficient documentation

## 2024-03-15 DIAGNOSIS — M5416 Radiculopathy, lumbar region: Secondary | ICD-10-CM

## 2024-03-15 DIAGNOSIS — R29898 Other symptoms and signs involving the musculoskeletal system: Secondary | ICD-10-CM

## 2024-03-15 NOTE — Telephone Encounter (Signed)
 I called her and told her the xray order is in and to go ahead asap and get it done. Patient agreed.

## 2024-03-16 ENCOUNTER — Ambulatory Visit: Admitting: Physical Therapy

## 2024-03-16 ENCOUNTER — Encounter: Payer: Self-pay | Admitting: Physical Therapy

## 2024-03-16 DIAGNOSIS — R262 Difficulty in walking, not elsewhere classified: Secondary | ICD-10-CM

## 2024-03-16 DIAGNOSIS — M79604 Pain in right leg: Secondary | ICD-10-CM | POA: Diagnosis not present

## 2024-03-16 NOTE — Therapy (Unsigned)
 OUTPATIENT PHYSICAL THERAPY TREATMENT  Patient Name: Rachel Ortega MRN: [id number] DOB:[birth year], 28 y.o., female Today's Date: 03/16/2024  END OF SESSION:  PT End of Session - 03/16/24 1350     Visit Number 4    Number of Visits 13    Date for PT Re-Evaluation 04/15/24    PT Start Time 1346    PT Stop Time 1431    PT Time Calculation (min) 45 min    Activity Tolerance Patient tolerated treatment well;No increased pain    Behavior During Therapy [facility] for tasks assessed/performed                Past Medical History:  Diagnosis Date   Depression    Medical history non-contributory    Preeclampsia, third trimester 02/05/2018   Past Surgical History:  Procedure Laterality Date   TONSILLECTOMY Bilateral    TONSILLECTOMY     WISDOM TOOTH EXTRACTION Bilateral    Patient Active Problem List   Diagnosis Date Noted   Chronic lower extremity pain (Right) 03/15/2024   Right lumbar radiculitis 03/09/2024   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) 03/09/2024   Transient weakness of right lower extremity 03/09/2024   Obesity, Class I, BMI 30-34.9 02/19/2024   Enthesopathy of knee region (Right) 01/27/2024   Acute knee pain (Right) 01/07/2024   Medial knee pain (Right) 01/07/2024   Postdural puncture headache 11/18/2023   Postpartum spinal headache 11/18/2023   Anxiety due to invasive procedure 11/18/2023   NSVD (normal spontaneous vaginal delivery) 11/13/2023   Gestational diabetes 11/11/2023   Postpartum hemorrhage 11/11/2023   Spotting affecting pregnancy 09/22/2023   Vaginal bleeding during pregnancy 09/22/2023   SS-A antibody positive 09/22/2023   Obesity affecting pregnancy 09/22/2023   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, antepartum 09/22/2023   Rash and nonspecific skin eruption 09/19/2023   Anxiety and depression 07/14/2023   History of pre-eclampsia in prior pregnancy, currently pregnant 07/14/2023   Positive ANA (antinuclear  antibody) 07/14/2023   Supervision of high risk pregnancy in second trimester 04/22/2023   Thrombocytopenia due to blood loss 02/07/2018   family H/O inherited metabolic disease 56/21/3086    PCP: Noralyn Pick, NP  REFERRING PROVIDER: Delano Metz, MD  REFERRING DIAG:  [phone] (ICD-10-CM) - Acute pain of right knee  M25.561 (ICD-10-CM) - Right medial knee pain  M76.891 (ICD-10-CM) - Enthesopathy of right knee region    RATIONALE FOR EVALUATION AND TREATMENT: Rehabilitation  THERAPY DIAG: Pain in right leg  Difficulty in walking, not elsewhere classified  ONSET DATE: 10/2023  FOLLOW-UP APPT SCHEDULED WITH REFERRING PROVIDER: Yes ; 03/09/24   PERTINENT HISTORY: Pt is a 28 year old female with primary c/o R knee/medial thigh/leg pain. Pt had injections on 02/19/24 in R knee that did not help. Patient reports negative experience with epidural, and she feels that her symptoms began after this. Patient reports burning "all of a sudden" pain that can go down to her foot. She states it is debilitating. Pt is taking Gabapentin for pain control. No significant integumentary change. Her child is 6 months old and she has 8-year-old. Pt reports doing well with childcare duties. Pt reports being unable to step on RLE when she is having episode of pain. Pt reports some nocturnal symptoms and even pain when at rest; sporadic onset of symptoms. Symptoms go away after 1-2 minutes. Pt is ANA positive; no active autoimmune disease.   PAIN:   Pain Intensity: Present: 0/10, Best: 0/10, Worst: 10/10 Pain location:  anteromedial knee along medial peripatellar region  Pain quality: burning Radiating pain: Yes ; radiating from distomedial thigh down to dorsum of foot Swelling: No  Popping, catching, locking: No  Numbness/Tingling: No Focal weakness or buckling: No Aggravating factors: Hurts sporadically without aggravating position/posture Relieving factors: Gabapentin, massage along distomedial  thigh/VMO  24-hour pain behavior: None  History of prior back, hip, or knee injury, pain, surgery, or therapy: Yes; hx of back pain and chiropractic   Imaging: Yes ;  X-rays negative  Prior level of function: Independent 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: Relaxing with family   Living Environment Lives with: lives with their partner, 70 y/o daughter, 29-month-old boy Lives in: House/apartment  Patient Goals: Knee pain gone; able to get off of Gabapentin    OBJECTIVE:   Patient Surveys  LEFS 12/80 = 15%  Gross Musculoskeletal Assessment Tremor: None Bulk: Normal Tone: Normal Pt squats with weight on forefoot and is able to maintain SLR well with control of pronation of ankles    GAIT: Distance walked: 50 ft Assistive device utilized: None Level of assistance: Complete Independence Comments: L>R ankle overpronation  AROM  Thoracolumbar AROM Flexion 75% (no pain) Extension WNL (no pain) Lateral flexion: R 50%, L 100% Rotation: R 100%, L 75%   AROM (Normal range in degrees) AROM 03/04/24  Hip Right Left  Flexion (125) WNL   Extension (15)    Abduction (40)    Adduction     Internal Rotation (45) WNL   External Rotation (45) WNL       Knee    Flexion (135) WNL   Extension (0) WNL       (* = pain; Blank rows = not tested)  LE MMT: MMT (out of 5) Right 03/04/24 Left 03/04/24  Hip flexion 4+ 4+  Hip extension 4 4  Hip abduction 4+ 5  Hip adduction    Hip internal rotation 5 5  Hip external rotation 4 4+  Knee flexion 5 5  Knee extension 5 5  Ankle dorsiflexion    Ankle plantarflexion    Ankle inversion    Ankle eversion    (* = pain; Blank rows = not tested)  Reflexes R/L Knee Jerk (L3/4): 2+/2+ Ankle Jerk (S1/2): 2+/2+ Clonus: Negative bilat  Muscle Length Hamstrings: R: Positive L: Positive Quadriceps (Ely): R: Positive L: Positive  Palpation TTP pes anserinus 1, adductor longus 1 Graded on 0-4 scale (0 = no pain, 1 =  pain, 2 = pain with wincing/grimacing/flinching, 3 = pain with withdrawal, 4 = unwilling to allow palpation)  VASCULAR Dorsalis pedis and posterior tibial pulses are palpable   SPECIAL TESTS  Lumbar Radiculopathy SLUMP: R Negative, L Negative SLR: R Negative, L Negative  Prone Knee Bend: R Positive, L Negative  Meniscus Tests McMurray's Test:  Medial Meniscus (Tibial ER): R: Negative L: Negative Effusion: R: Negative L: Negative   Patellofemoral Pain Syndrome Patellar Tilt (Lateral): R: Not examined L: Not examined Patellar Apprehension: R: Negative L: Negative Squatting pain: R: Negative L: Negative Resisted knee extension pain: R: Negative L: Negative Compression: R: Not examined L: Not examined Clarke's sign: R: Not examined L: Not examined Lateral Pull: R: Negative L: Negative     TODAY'S TREATMENT:   SUBJECTIVE STATEMENT:   Patient reports no flares since last PT follow-up. Symptoms are under control with Gabapentin. Patient reports no relief from intra-articular steroid injections to R knee. Patient reports compliance with her HEP. Patient reports no symptoms at the moment at arrival  to PT.     Manual Therapy - for symptom modulation, soft tissue sensitivity and mobility, joint mobility, ROM   STM along R vastus medialis and adductor longus/gracilis x 10 minutes    Trigger Point Dry Needling  Subsequent Treatment: Instructions provided previously at initial dry needling treatment.   Patient Verbal Consent Given: Yes Education Handout Provided: Yes Muscles Treated: R adductor longus and R vastus medialis x 2 with 0.30 x 60 mm single needle placements Electrical Stimulation Performed: Yes, 2 0.30 x 60 mm needles in vastus medialis; Parameters: 1.0 milliamps at 5 Hz x 7 min, 2 microamps at 80 Hz Treatment Response/Outcome:     Therapeutic Exercise - for improved soft tissue flexibility and extensibility as needed for ROM, neurodynamics to improve neural  tension and reduce neuralgias/paresthesias  Prone Quadriceps stretch; 3 x 30 sec  Quadruped donkey kick; 2 x 10 on either side  Glute bridge, with alternating leg extension; 1x10 alt R/L   PATIENT EDUCATION: Discussed expectations following treatment and encouraged continued HEP with addition of prone femoral tensioners as long as pt is not having increased neuralgias. Discussed increased hydration following DN.    *not today* Prone femoral nerve flossing; 20x Prone femoral nerve tensioner; 20x Supine hip abduction stretch; 3 x 30 sec Supine active hamstring stretch; 20x   PATIENT EDUCATION:  Education details: see above for patient education details Person educated: Patient Education method: Explanation, Demonstration, and Handouts Education comprehension: verbalized understanding and returned demonstration   HOME EXERCISE PROGRAM:  Access Code: [email or web address] URL: [email or web address] Date: 03/07/2024 Prepared by: Consuela Mimes  Exercises - Prone Femoral Nerve Mobilization  - 3 x daily - 7 x weekly - 2 sets - 10 reps - 1-2sec hold - Supine Hamstring Stretch  - 3 x daily - 7 x weekly - 2 sets - 10 reps - 1-2sec hold - Supine Butterfly Groin Stretch  - 3 x daily - 7 x weekly - 3 sets - 30sec hold   ASSESSMENT:  CLINICAL IMPRESSION: Patient fortunately has not experienced significant flare-up since first treatment this week, though her symptoms are sporadic and can occur with severe intensity. We utilized dry needling with e-stim today for further symptom modulation and neuromodulation along with continued neurodynamics and stretching program to address neuralgias/leg pain. We will continue to monitor for any future flare-ups. Pt will continue to benefit from skilled PT services to address deficits and improve function.    OBJECTIVE IMPAIRMENTS: difficulty walking, decreased ROM, decreased strength, impaired flexibility, and pain.   ACTIVITY LIMITATIONS:  lifting, standing, stairs, transfers, and locomotion level  PARTICIPATION LIMITATIONS: cleaning, shopping, and community activity  PERSONAL FACTORS: Past/current experiences, Time since onset of injury/illness/exacerbation, and 1-2 comorbidities: (Depression, Positive ANA)  are also affecting patient's functional outcome.   REHAB POTENTIAL: Good  CLINICAL DECISION MAKING: Unstable/unpredictable  EVALUATION COMPLEXITY: High   GOALS: Goals reviewed with patient? Yes  SHORT TERM GOALS: Target date: 03/28/2024  Pt will be independent with HEP to improve strength and decrease knee pain to improve pain-free function at home and work. Baseline: 03/04/24: Baseline HEP initiated.  Goal status: INITIAL   LONG TERM GOALS: Target date: 04/18/2024  Pt will have no nocturnal symptoms disturbing sleep as needed for improved sleep quality and long-term wellness Baseline: 03/04/24: Frequent symptoms which occur sporadically even when at rest.  Goal status: INITIAL  2.  Pt will decrease worst knee pain by at least 3 points on the NPRS in order to demonstrate clinically significant reduction in knee  pain. Baseline: 03/04/24: 10/10 pain at worst.  Goal status: INITIAL  3.  Pt will decrease LEFS score by at least 9 points in order demonstrate clinically significant reduction in knee pain/disability.       Baseline: 03/04/24:  Goal status: INITIAL    PLAN: PT FREQUENCY: 1-2x/week  PT DURATION: 6 weeks  PLANNED INTERVENTIONS: Therapeutic exercises, Therapeutic activity, Neuromuscular re-education, Balance training, Gait training, Patient/Family education, Self Care, Joint mobilization, Joint manipulation, Vestibular training, Canalith repositioning, Orthotic/Fit training, DME instructions, Dry Needling, Electrical stimulation, Spinal manipulation, Spinal mobilization, Cryotherapy, Moist heat, Taping, Traction, Ultrasound, Ionotophoresis 4mg /ml Dexamethasone, Manual therapy, and Re-evaluation.  PLAN  FOR NEXT SESSION: Neurodynamics; soft tissue stretching for adductors/quads. Manual therapy for desensitization of affected tissues. F/u on dry needling response.     Consuela Mimes, PT, DPT #[id number]  Gertie Exon, PT 03/16/2024, 2:29 PM

## 2024-03-17 ENCOUNTER — Encounter: Payer: Self-pay | Admitting: Physical Therapy

## 2024-03-18 ENCOUNTER — Ambulatory Visit: Payer: Medicaid Other | Admitting: Physical Therapy

## 2024-03-19 ENCOUNTER — Ambulatory Visit
Admission: RE | Admit: 2024-03-19 | Discharge: 2024-03-19 | Disposition: A | Discharge status: Home / Self Care | Attending: Pain Medicine | Admitting: Pain Medicine

## 2024-03-19 ENCOUNTER — Ambulatory Visit
Admission: RE | Admit: 2024-03-19 | Discharge: 2024-03-19 | Disposition: A | Discharge status: Home / Self Care | Source: Ambulatory Visit | Attending: Pain Medicine | Admitting: Pain Medicine

## 2024-03-19 DIAGNOSIS — R29898 Other symptoms and signs involving the musculoskeletal system: Secondary | ICD-10-CM | POA: Insufficient documentation

## 2024-03-19 DIAGNOSIS — G8929 Other chronic pain: Secondary | ICD-10-CM | POA: Diagnosis present

## 2024-03-19 DIAGNOSIS — M5416 Radiculopathy, lumbar region: Secondary | ICD-10-CM | POA: Insufficient documentation

## 2024-03-19 DIAGNOSIS — M5441 Lumbago with sciatica, right side: Secondary | ICD-10-CM | POA: Diagnosis present

## 2024-03-19 DIAGNOSIS — M79604 Pain in right leg: Secondary | ICD-10-CM | POA: Insufficient documentation

## 2024-03-22 ENCOUNTER — Ambulatory Visit: Admitting: Physical Therapy

## 2024-03-22 ENCOUNTER — Telehealth: Payer: Self-pay | Admitting: Pain Medicine

## 2024-03-22 ENCOUNTER — Encounter: Payer: Self-pay | Admitting: Physical Therapy

## 2024-03-22 DIAGNOSIS — M79604 Pain in right leg: Secondary | ICD-10-CM | POA: Diagnosis not present

## 2024-03-22 DIAGNOSIS — R262 Difficulty in walking, not elsewhere classified: Secondary | ICD-10-CM

## 2024-03-22 NOTE — Telephone Encounter (Signed)
 Message sent to [facility] to check insurance coverage status.

## 2024-03-22 NOTE — Telephone Encounter (Signed)
 FYI patient wanted to inform Laban Emperor that she went got xrays done on 03-19-24. Just wanted to see when she can get schedule to have a MRI done.

## 2024-03-22 NOTE — Therapy (Signed)
 OUTPATIENT PHYSICAL THERAPY TREATMENT  Patient Name: Rachel Ortega MRN: [id number] DOB:December 13, 1996, 28 y.o., female Today's Date: 03/22/2024  END OF SESSION:  PT End of Session - 03/22/24 1033     Visit Number 5    Number of Visits 13    Date for PT Re-Evaluation 04/15/24    PT Start Time 1033    PT Stop Time 1118    PT Time Calculation (min) 45 min    Activity Tolerance Patient tolerated treatment well;No increased pain    Behavior During Therapy [facility] for tasks assessed/performed              Past Medical History:  Diagnosis Date   Depression    Medical history non-contributory    Preeclampsia, third trimester 02/05/2018   Past Surgical History:  Procedure Laterality Date   TONSILLECTOMY Bilateral    TONSILLECTOMY     WISDOM TOOTH EXTRACTION Bilateral    Patient Active Problem List   Diagnosis Date Noted   Chronic lower extremity pain (Right) 03/15/2024   Right lumbar radiculitis 03/09/2024   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) 03/09/2024   Transient weakness of right lower extremity 03/09/2024   Obesity, Class I, BMI 30-34.9 02/19/2024   Enthesopathy of knee region (Right) 01/27/2024   Acute knee pain (Right) 01/07/2024   Medial knee pain (Right) 01/07/2024   Postdural puncture headache 11/18/2023   Postpartum spinal headache 11/18/2023   Anxiety due to invasive procedure 11/18/2023   NSVD (normal spontaneous vaginal delivery) 11/13/2023   Gestational diabetes 11/11/2023   Postpartum hemorrhage 11/11/2023   Spotting affecting pregnancy 09/22/2023   Vaginal bleeding during pregnancy 09/22/2023   SS-A antibody positive 09/22/2023   Obesity affecting pregnancy 09/22/2023   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, antepartum 09/22/2023   Rash and nonspecific skin eruption 09/19/2023   Anxiety and depression 07/14/2023   History of pre-eclampsia in prior pregnancy, currently pregnant 07/14/2023   Positive ANA (antinuclear  antibody) 07/14/2023   Supervision of high risk pregnancy in second trimester 04/22/2023   Thrombocytopenia due to blood loss 02/07/2018   family H/O inherited metabolic disease 40/98/1191    PCP: Noralyn Pick, NP  REFERRING PROVIDER: Delano Metz, MD  REFERRING DIAG:  [phone] (ICD-10-CM) - Acute pain of right knee  M25.561 (ICD-10-CM) - Right medial knee pain  M76.891 (ICD-10-CM) - Enthesopathy of right knee region    RATIONALE FOR EVALUATION AND TREATMENT: Rehabilitation  THERAPY DIAG: Pain in right leg  Difficulty in walking, not elsewhere classified  ONSET DATE: 10/2023  FOLLOW-UP APPT SCHEDULED WITH REFERRING PROVIDER: Yes ; 03/09/24   PERTINENT HISTORY: Pt is a 28 year old female with primary c/o R knee/medial thigh/leg pain. Pt had injections on 02/19/24 in R knee that did not help. Patient reports negative experience with epidural, and she feels that her symptoms began after this. Patient reports burning "all of a sudden" pain that can go down to her foot. She states it is debilitating. Pt is taking Gabapentin for pain control. No significant integumentary change. Her child is 27 months old and she has 50-year-old. Pt reports doing well with childcare duties. Pt reports being unable to step on RLE when she is having episode of pain. Pt reports some nocturnal symptoms and even pain when at rest; sporadic onset of symptoms. Symptoms go away after 1-2 minutes. Pt is ANA positive; no active autoimmune disease.   PAIN:   Pain Intensity: Present: 0/10, Best: 0/10, Worst: 10/10 Pain location: anteromedial knee  along medial peripatellar region  Pain quality: burning Radiating pain: Yes ; radiating from distomedial thigh down to dorsum of foot Swelling: No  Popping, catching, locking: No  Numbness/Tingling: No Focal weakness or buckling: No Aggravating factors: Hurts sporadically without aggravating position/posture Relieving factors: Gabapentin, massage along distomedial  thigh/VMO  24-hour pain behavior: None  History of prior back, hip, or knee injury, pain, surgery, or therapy: Yes; hx of back pain and chiropractic   Imaging: Yes ;  X-rays negative  Prior level of function: Independent 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: Relaxing with family   Living Environment Lives with: lives with their partner, 47 y/o daughter, 31-month-old boy Lives in: House/apartment  Patient Goals: Knee pain gone; able to get off of Gabapentin    OBJECTIVE:   Patient Surveys  LEFS 12/80 = 15%  Gross Musculoskeletal Assessment Tremor: None Bulk: Normal Tone: Normal Pt squats with weight on forefoot and is able to maintain SLR well with control of pronation of ankles    GAIT: Distance walked: 50 ft Assistive device utilized: None Level of assistance: Complete Independence Comments: L>R ankle overpronation  AROM  Thoracolumbar AROM Flexion 75% (no pain) Extension WNL (no pain) Lateral flexion: R 50%, L 100% Rotation: R 100%, L 75%   AROM (Normal range in degrees) AROM 03/04/24  Hip Right Left  Flexion (125) WNL   Extension (15)    Abduction (40)    Adduction     Internal Rotation (45) WNL   External Rotation (45) WNL       Knee    Flexion (135) WNL   Extension (0) WNL       (* = pain; Blank rows = not tested)  LE MMT: MMT (out of 5) Right 03/04/24 Left 03/04/24  Hip flexion 4+ 4+  Hip extension 4 4  Hip abduction 4+ 5  Hip adduction    Hip internal rotation 5 5  Hip external rotation 4 4+  Knee flexion 5 5  Knee extension 5 5  Ankle dorsiflexion    Ankle plantarflexion    Ankle inversion    Ankle eversion    (* = pain; Blank rows = not tested)  Reflexes R/L Knee Jerk (L3/4): 2+/2+ Ankle Jerk (S1/2): 2+/2+ Clonus: Negative bilat  Muscle Length Hamstrings: R: Positive L: Positive Quadriceps (Ely): R: Positive L: Positive  Palpation TTP pes anserinus 1, adductor longus 1 Graded on 0-4 scale (0 = no pain, 1 =  pain, 2 = pain with wincing/grimacing/flinching, 3 = pain with withdrawal, 4 = unwilling to allow palpation)  VASCULAR Dorsalis pedis and posterior tibial pulses are palpable   SPECIAL TESTS  Lumbar Radiculopathy SLUMP: R Negative, L Negative SLR: R Negative, L Negative  Prone Knee Bend: R Positive, L Negative  Meniscus Tests McMurray's Test:  Medial Meniscus (Tibial ER): R: Negative L: Negative Effusion: R: Negative L: Negative   Patellofemoral Pain Syndrome Patellar Tilt (Lateral): R: Not examined L: Not examined Patellar Apprehension: R: Negative L: Negative Squatting pain: R: Negative L: Negative Resisted knee extension pain: R: Negative L: Negative Compression: R: Not examined L: Not examined Clarke's sign: R: Not examined L: Not examined Lateral Pull: R: Negative L: Negative     TODAY'S TREATMENT          03/22/2024   SUBJECTIVE STATEMENT:   Patient reports one episode of aching along R medial knee yesterday without specific aggravating activity. Pt denies significant symptoms at arrival. Pt is compliant with HEP.    Manual Therapy -  for symptom modulation, soft tissue sensitivity and mobility, joint mobility, ROM   STM along R vastus medialis and adductor longus/gracilis x 15 minutes    Trigger Point Dry Needling  Subsequent Treatment: Instructions provided previously at initial dry needling treatment.   Patient Verbal Consent Given: Yes Education Handout Provided: Yes Muscles Treated: R adductor longus and R vastus medialis x 2 with 0.30 x 60 mm single needle placements Electrical Stimulation Performed: Yes, 2 0.30 x 60 mm needles in vastus medialis; Parameters: 1.5 milliamps at 5 Hz x 7.5 min, 2 microamps at 80 Hz x 7.5 min Treatment Response/Outcome:     Therapeutic Exercise - for improved soft tissue flexibility and extensibility as needed for ROM, neurodynamics to improve neural tension and reduce neuralgias/paresthesias   Prone Quadriceps stretch; 3  x 30 sec  Seated elliptical; x 6 min, Level 6   PATIENT EDUCATION: HEP update and review (see Med[employer] program below).    *not today* Quadruped donkey kick; 2 x 10 on either side  Glute bridge, with alternating leg extension; 1x10 alt R/L Prone femoral nerve flossing; 20x Prone femoral nerve tensioner; 20x Supine hip abduction stretch; 3 x 30 sec Supine active hamstring stretch; 20x   PATIENT EDUCATION:  Education details: see above for patient education details Person educated: Patient Education method: Explanation, Demonstration, and Handouts Education comprehension: verbalized understanding and returned demonstration   HOME EXERCISE PROGRAM:  Access Code: [email or web address] URL: [email or web address] Date: 03/22/2024 Prepared by: Consuela Mimes  Exercises - Prone Femoral Nerve Mobilization  - 3 x daily - 7 x weekly - 2 sets - 10 reps - 1-2sec hold - Supine Hamstring Stretch  - 3 x daily - 7 x weekly - 2 sets - 10 reps - 1-2sec hold - Supine Butterfly Groin Stretch  - 3 x daily - 7 x weekly - 3 sets - 30sec hold - Quadruped Hip Abduction and External Rotation  - 1 x daily - 4-5 x weekly - 2 sets - 10 reps - 3sec hold - Supine Bridge  - 1 x daily - 4-5 x weekly - 2 sets - 10 reps - 3sec hold   ASSESSMENT:  CLINICAL IMPRESSION: Patient did experience episode of achy R medial knee pain over the weekend. She has no pain this AM and has largely reported minimal neuralgia or lancinating symptoms over previous 2 weeks. Pt had L-spine X-ray with no significant findings - only mild levoscoliosis. HEP was updated for hip strengthening today; pt to continue with stretching program and neurodynamics. We will continue to monitor for any future flare-ups. Pt will continue to benefit from skilled PT services to address deficits and improve function.    OBJECTIVE IMPAIRMENTS: difficulty walking, decreased ROM, decreased strength, impaired flexibility, and pain.   ACTIVITY  LIMITATIONS: lifting, standing, stairs, transfers, and locomotion level  PARTICIPATION LIMITATIONS: cleaning, shopping, and community activity  PERSONAL FACTORS: Past/current experiences, Time since onset of injury/illness/exacerbation, and 1-2 comorbidities: (Depression, Positive ANA)  are also affecting patient's functional outcome.   REHAB POTENTIAL: Good  CLINICAL DECISION MAKING: Unstable/unpredictable  EVALUATION COMPLEXITY: High   GOALS: Goals reviewed with patient? Yes  SHORT TERM GOALS: Target date: 03/28/2024  Pt will be independent with HEP to improve strength and decrease knee pain to improve pain-free function at home and work. Baseline: 03/04/24: Baseline HEP initiated.  Goal status: INITIAL   LONG TERM GOALS: Target date: 04/18/2024  Pt will have no nocturnal symptoms disturbing sleep as needed for improved sleep quality and long-term wellness Baseline:  03/04/24: Frequent symptoms which occur sporadically even when at rest.  Goal status: INITIAL  2.  Pt will decrease worst knee pain by at least 3 points on the NPRS in order to demonstrate clinically significant reduction in knee pain. Baseline: 03/04/24: 10/10 pain at worst.  Goal status: INITIAL  3.  Pt will decrease LEFS score by at least 9 points in order demonstrate clinically significant reduction in knee pain/disability.       Baseline: 03/04/24:  Goal status: INITIAL    PLAN: PT FREQUENCY: 1-2x/week  PT DURATION: 6 weeks  PLANNED INTERVENTIONS: Therapeutic exercises, Therapeutic activity, Neuromuscular re-education, Balance training, Gait training, Patient/Family education, Self Care, Joint mobilization, Joint manipulation, Vestibular training, Canalith repositioning, Orthotic/Fit training, DME instructions, Dry Needling, Electrical stimulation, Spinal manipulation, Spinal mobilization, Cryotherapy, Moist heat, Taping, Traction, Ultrasound, Ionotophoresis 4mg /ml Dexamethasone, Manual therapy, and  Re-evaluation.  PLAN FOR NEXT SESSION: Neurodynamics; soft tissue stretching for adductors/quads. Manual therapy for desensitization of affected tissues. F/u on dry needling response.     Consuela Mimes, PT, DPT #[id number]  Gertie Exon, PT 03/22/2024, 12:33 PM

## 2024-03-24 ENCOUNTER — Ambulatory Visit: Attending: Pain Medicine | Admitting: Physical Therapy

## 2024-03-24 ENCOUNTER — Encounter: Payer: Self-pay | Admitting: Physical Therapy

## 2024-03-24 DIAGNOSIS — M79604 Pain in right leg: Secondary | ICD-10-CM | POA: Insufficient documentation

## 2024-03-24 DIAGNOSIS — R262 Difficulty in walking, not elsewhere classified: Secondary | ICD-10-CM | POA: Insufficient documentation

## 2024-03-24 NOTE — Therapy (Unsigned)
 OUTPATIENT PHYSICAL THERAPY TREATMENT  Patient Name: Rachel Ortega MRN: [id number] DOB:[birth year], 28 y.o., female Today's Date: 03/24/2024  END OF SESSION:  PT End of Session - 03/24/24 1251     Visit Number 6    Number of Visits 13    Date for PT Re-Evaluation 04/15/24    PT Start Time 1248    PT Stop Time 1329    PT Time Calculation (min) 41 min    Activity Tolerance Patient tolerated treatment well;No increased pain    Behavior During Therapy [facility] for tasks assessed/performed              Past Medical History:  Diagnosis Date   Depression    Medical history non-contributory    Preeclampsia, third trimester 02/05/2018   Past Surgical History:  Procedure Laterality Date   TONSILLECTOMY Bilateral    TONSILLECTOMY     WISDOM TOOTH EXTRACTION Bilateral    Patient Active Problem List   Diagnosis Date Noted   Chronic lower extremity pain (Right) 03/15/2024   Right lumbar radiculitis 03/09/2024   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) 03/09/2024   Transient weakness of right lower extremity 03/09/2024   Obesity, Class I, BMI 30-34.9 02/19/2024   Enthesopathy of knee region (Right) 01/27/2024   Acute knee pain (Right) 01/07/2024   Medial knee pain (Right) 01/07/2024   Postdural puncture headache 11/18/2023   Postpartum spinal headache 11/18/2023   Anxiety due to invasive procedure 11/18/2023   NSVD (normal spontaneous vaginal delivery) 11/13/2023   Gestational diabetes 11/11/2023   Postpartum hemorrhage 11/11/2023   Spotting affecting pregnancy 09/22/2023   Vaginal bleeding during pregnancy 09/22/2023   SS-A antibody positive 09/22/2023   Obesity affecting pregnancy 09/22/2023   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, antepartum 09/22/2023   Rash and nonspecific skin eruption 09/19/2023   Anxiety and depression 07/14/2023   History of pre-eclampsia in prior pregnancy, currently pregnant 07/14/2023   Positive ANA (antinuclear  antibody) 07/14/2023   Supervision of high risk pregnancy in second trimester 04/22/2023   Thrombocytopenia due to blood loss 02/07/2018   family H/O inherited metabolic disease 86/57/8469    PCP: Noralyn Pick, NP  REFERRING PROVIDER: Delano Metz, MD  REFERRING DIAG:  [phone] (ICD-10-CM) - Acute pain of right knee  M25.561 (ICD-10-CM) - Right medial knee pain  M76.891 (ICD-10-CM) - Enthesopathy of right knee region    RATIONALE FOR EVALUATION AND TREATMENT: Rehabilitation  THERAPY DIAG: Pain in right leg  Difficulty in walking, not elsewhere classified  ONSET DATE: 10/2023  FOLLOW-UP APPT SCHEDULED WITH REFERRING PROVIDER: Yes ; 03/09/24   PERTINENT HISTORY: Pt is a 28 year old female with primary c/o R knee/medial thigh/leg pain. Pt had injections on 02/19/24 in R knee that did not help. Patient reports negative experience with epidural, and she feels that her symptoms began after this. Patient reports burning "all of a sudden" pain that can go down to her foot. She states it is debilitating. Pt is taking Gabapentin for pain control. No significant integumentary change. Her child is 67 months old and she has 48-year-old. Pt reports doing well with childcare duties. Pt reports being unable to step on RLE when she is having episode of pain. Pt reports some nocturnal symptoms and even pain when at rest; sporadic onset of symptoms. Symptoms go away after 1-2 minutes. Pt is ANA positive; no active autoimmune disease.   PAIN:   Pain Intensity: Present: 0/10, Best: 0/10, Worst: 10/10 Pain location: anteromedial knee  along medial peripatellar region  Pain quality: burning Radiating pain: Yes ; radiating from distomedial thigh down to dorsum of foot Swelling: No  Popping, catching, locking: No  Numbness/Tingling: No Focal weakness or buckling: No Aggravating factors: Hurts sporadically without aggravating position/posture Relieving factors: Gabapentin, massage along distomedial  thigh/VMO  24-hour pain behavior: None  History of prior back, hip, or knee injury, pain, surgery, or therapy: Yes; hx of back pain and chiropractic   Imaging: Yes ;  X-rays negative  Prior level of function: Independent 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: Relaxing with family   Living Environment Lives with: lives with their partner, 57 y/o daughter, 55-month-old boy Lives in: House/apartment  Patient Goals: Knee pain gone; able to get off of Gabapentin    OBJECTIVE:   Patient Surveys  LEFS 12/80 = 15%  Gross Musculoskeletal Assessment Tremor: None Bulk: Normal Tone: Normal Pt squats with weight on forefoot and is able to maintain SLR well with control of pronation of ankles    GAIT: Distance walked: 50 ft Assistive device utilized: None Level of assistance: Complete Independence Comments: L>R ankle overpronation  AROM  Thoracolumbar AROM Flexion 75% (no pain) Extension WNL (no pain) Lateral flexion: R 50%, L 100% Rotation: R 100%, L 75%   AROM (Normal range in degrees) AROM 03/04/24  Hip Right Left  Flexion (125) WNL   Extension (15)    Abduction (40)    Adduction     Internal Rotation (45) WNL   External Rotation (45) WNL       Knee    Flexion (135) WNL   Extension (0) WNL       (* = pain; Blank rows = not tested)  LE MMT: MMT (out of 5) Right 03/04/24 Left 03/04/24  Hip flexion 4+ 4+  Hip extension 4 4  Hip abduction 4+ 5  Hip adduction    Hip internal rotation 5 5  Hip external rotation 4 4+  Knee flexion 5 5  Knee extension 5 5  Ankle dorsiflexion    Ankle plantarflexion    Ankle inversion    Ankle eversion    (* = pain; Blank rows = not tested)  Reflexes R/L Knee Jerk (L3/4): 2+/2+ Ankle Jerk (S1/2): 2+/2+ Clonus: Negative bilat  Muscle Length Hamstrings: R: Positive L: Positive Quadriceps (Ely): R: Positive L: Positive  Palpation TTP pes anserinus 1, adductor longus 1 Graded on 0-4 scale (0 = no pain, 1 =  pain, 2 = pain with wincing/grimacing/flinching, 3 = pain with withdrawal, 4 = unwilling to allow palpation)  VASCULAR Dorsalis pedis and posterior tibial pulses are palpable   SPECIAL TESTS  Lumbar Radiculopathy SLUMP: R Negative, L Negative SLR: R Negative, L Negative  Prone Knee Bend: R Positive, L Negative  Meniscus Tests McMurray's Test:  Medial Meniscus (Tibial ER): R: Negative L: Negative Effusion: R: Negative L: Negative   Patellofemoral Pain Syndrome Patellar Tilt (Lateral): R: Not examined L: Not examined Patellar Apprehension: R: Negative L: Negative Squatting pain: R: Negative L: Negative Resisted knee extension pain: R: Negative L: Negative Compression: R: Not examined L: Not examined Clarke's sign: R: Not examined L: Not examined Lateral Pull: R: Negative L: Negative     TODAY'S TREATMENT          03/24/2024   SUBJECTIVE STATEMENT:   Patient reports one episode of sharp shooting pain yesterday that was not debilitating. Patient denies pain at arrival. She has comorbid cervicothoracic and periscapular pain at this time.   Manual Therapy -  for symptom modulation, soft tissue sensitivity and mobility, joint mobility, ROM   STM along R vastus medialis and adductor longus/gracilis x 15 minutes    Trigger Point Dry Needling  Subsequent Treatment: Instructions provided previously at initial dry needling treatment.   Patient Verbal Consent Given: Yes Education Handout Provided: Yes Muscles Treated: R adductor longus and R vastus medialis x 2 with 0.30 x 60 mm single needle placements Electrical Stimulation Performed: Yes, 2 0.30 x 60 mm needles in vastus medialis; Parameters: 1.5 milliamps at 5 Hz x 7.5 min, 2 microamps at 80 Hz x 7.5 min Treatment Response/Outcome:     Therapeutic Exercise - for improved soft tissue flexibility and extensibility as needed for ROM, neurodynamics to improve neural tension and reduce neuralgias/paresthesias  Seated elliptical;  x 5 min, Level 7   Prone Quadriceps stretch; 2 x 30 sec   PATIENT EDUCATION: HEP reviewed. Discussed transition in POC over next week from DN/e-stim to exercise-based intervention.   *not today* Quadruped donkey kick; 2 x 10 on either side  Glute bridge, with alternating leg extension; 1x10 alt R/L Prone femoral nerve flossing; 20x Prone femoral nerve tensioner; 20x Supine hip abduction stretch; 3 x 30 sec Supine active hamstring stretch; 20x   PATIENT EDUCATION:  Education details: see above for patient education details Person educated: Patient Education method: Explanation, Demonstration, and Handouts Education comprehension: verbalized understanding and returned demonstration   HOME EXERCISE PROGRAM:  Access Code: [email or web address] URL: [email or web address] Date: 03/22/2024 Prepared by: Consuela Mimes  Exercises - Prone Femoral Nerve Mobilization  - 3 x daily - 7 x weekly - 2 sets - 10 reps - 1-2sec hold - Supine Hamstring Stretch  - 3 x daily - 7 x weekly - 2 sets - 10 reps - 1-2sec hold - Supine Butterfly Groin Stretch  - 3 x daily - 7 x weekly - 3 sets - 30sec hold - Quadruped Hip Abduction and External Rotation  - 1 x daily - 4-5 x weekly - 2 sets - 10 reps - 3sec hold - Supine Bridge  - 1 x daily - 4-5 x weekly - 2 sets - 10 reps - 3sec hold   ASSESSMENT:  CLINICAL IMPRESSION: Patient has experienced intermittent episodes of pain affecting R thigh/medial knee complex which have been of lesser intensity per pt report. She has not experienced notable pain during time in clinic with exception of fleeting reproduction of symptoms with needle insertion. Pt does seem to be responding well so far; however, she is still controlling symptoms with Gabapentin. Pt is awaiting further workup for L-spine and may move to advanced imaging if symptoms persist. We will continue to monitor for any future flare-ups. Pt will continue to benefit from skilled PT services to address  deficits and improve function.    OBJECTIVE IMPAIRMENTS: difficulty walking, decreased ROM, decreased strength, impaired flexibility, and pain.   ACTIVITY LIMITATIONS: lifting, standing, stairs, transfers, and locomotion level  PARTICIPATION LIMITATIONS: cleaning, shopping, and community activity  PERSONAL FACTORS: Past/current experiences, Time since onset of injury/illness/exacerbation, and 1-2 comorbidities: (Depression, Positive ANA)  are also affecting patient's functional outcome.   REHAB POTENTIAL: Good  CLINICAL DECISION MAKING: Unstable/unpredictable  EVALUATION COMPLEXITY: High   GOALS: Goals reviewed with patient? Yes  SHORT TERM GOALS: Target date: 03/28/2024  Pt will be independent with HEP to improve strength and decrease knee pain to improve pain-free function at home and work. Baseline: 03/04/24: Baseline HEP initiated.  Goal status: INITIAL   LONG TERM GOALS: Target date:  04/18/2024  Pt will have no nocturnal symptoms disturbing sleep as needed for improved sleep quality and long-term wellness Baseline: 03/04/24: Frequent symptoms which occur sporadically even when at rest.  Goal status: INITIAL  2.  Pt will decrease worst knee pain by at least 3 points on the NPRS in order to demonstrate clinically significant reduction in knee pain. Baseline: 03/04/24: 10/10 pain at worst.  Goal status: INITIAL  3.  Pt will decrease LEFS score by at least 9 points in order demonstrate clinically significant reduction in knee pain/disability.       Baseline: 03/04/24:  Goal status: INITIAL    PLAN: PT FREQUENCY: 1-2x/week  PT DURATION: 6 weeks  PLANNED INTERVENTIONS: Therapeutic exercises, Therapeutic activity, Neuromuscular re-education, Balance training, Gait training, Patient/Family education, Self Care, Joint mobilization, Joint manipulation, Vestibular training, Canalith repositioning, Orthotic/Fit training, DME instructions, Dry Needling, Electrical stimulation,  Spinal manipulation, Spinal mobilization, Cryotherapy, Moist heat, Taping, Traction, Ultrasound, Ionotophoresis 4mg /ml Dexamethasone, Manual therapy, and Re-evaluation.  PLAN FOR NEXT SESSION: Neurodynamics; soft tissue stretching for adductors/quads. Manual therapy for desensitization of affected tissues. F/u on dry needling response.     Consuela Mimes, PT, DPT #[id number]  Gertie Exon, PT 03/24/2024, 12:52 PM

## 2024-03-25 ENCOUNTER — Ambulatory Visit: Payer: Medicaid Other | Admitting: Physical Therapy

## 2024-03-30 ENCOUNTER — Ambulatory Visit: Admitting: Physical Therapy

## 2024-03-30 NOTE — Therapy (Deleted)
 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) 03/09/2024   Transient weakness of right lower extremity 03/09/2024   Obesity, Class I, BMI 30-34.9 02/19/2024   Enthesopathy of knee region (Right) 01/27/2024   Acute knee pain (Right) 01/07/2024   Medial knee pain (Right) 01/07/2024   Postdural puncture headache 11/18/2023   Postpartum spinal headache 11/18/2023   Anxiety due to invasive procedure 11/18/2023   NSVD (normal spontaneous vaginal delivery) 11/13/2023   Gestational diabetes 11/11/2023   Postpartum hemorrhage 11/11/2023   Spotting affecting pregnancy 09/22/2023   Vaginal bleeding during pregnancy 09/22/2023   SS-A antibody positive 09/22/2023   Obesity affecting pregnancy 09/22/2023   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, antepartum 09/22/2023   Rash and nonspecific skin eruption 09/19/2023   Anxiety and depression 07/14/2023   History of pre-eclampsia in prior pregnancy, currently pregnant 07/14/2023   Positive ANA (antinuclear antibody) 07/14/2023   Supervision of high risk pregnancy in second trimester 04/22/2023   Thrombocytopenia due to blood loss 02/07/2018   family H/O inherited metabolic disease 29/51/8841    PCP: Noralyn Pick, NP  REFERRING PROVIDER: Delano Metz, MD  REFERRING DIAG:  [phone] (ICD-10-CM) - Acute pain of right knee  M25.561  (ICD-10-CM) - Right medial knee pain  M76.891 (ICD-10-CM) - Enthesopathy of right knee region    RATIONALE FOR EVALUATION AND TREATMENT: Rehabilitation  THERAPY DIAG: Pain in right leg  Difficulty in walking, not elsewhere classified  ONSET DATE: 10/2023  FOLLOW-UP APPT SCHEDULED WITH REFERRING PROVIDER: Yes ; 03/09/24   PERTINENT HISTORY: Pt is a 28 year old female with primary c/o R knee/medial thigh/leg pain. Pt had injections on 02/19/24 in R knee that did not help. Patient reports negative experience with epidural, and she feels that her symptoms began after this. Patient reports burning "all of a sudden" pain that can go down to her foot. She states it is debilitating. Pt is taking Gabapentin for pain control. No significant integumentary change. Her child is 45 months old and she has 1-year-old. Pt reports doing well with childcare duties. Pt reports being unable to step on RLE when she is having episode of pain. Pt reports some nocturnal symptoms and even pain when at rest; sporadic onset of symptoms. Symptoms go away after 1-2 minutes. Pt is ANA positive; no active autoimmune disease.   PAIN:   Pain Intensity: Present: 0/10, Best: 0/10, Worst: 10/10 Pain location: anteromedial knee along medial peripatellar region  Pain quality: burning Radiating pain: Yes ; radiating from distomedial thigh down to dorsum of foot Swelling: No  Popping, catching, locking: No  Numbness/Tingling: No Focal weakness or buckling: No Aggravating factors: Hurts sporadically without aggravating position/posture Relieving factors: Gabapentin, massage along distomedial thigh/VMO  24-hour pain behavior: None  History of prior back, hip, or knee injury, pain, surgery, or therapy: Yes; hx of back pain and chiropractic   Imaging: Yes ;  X-rays negative  Prior level of function: Independent 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: Relaxing with family   Living Environment Lives with: lives with  their partner, 73 y/o daughter, 75-month-old boy Lives in: House/apartment  Patient Goals: Knee pain gone; able to get off of Gabapentin    OBJECTIVE:   Patient Surveys  LEFS 12/80 = 15%  Gross Musculoskeletal Assessment Tremor: None Bulk: Normal Tone: Normal Pt squats with weight on forefoot and is able to maintain SLR well with control of pronation of ankles    GAIT: Distance walked: 50 ft Assistive device utilized: None Level of assistance: Complete Independence Comments: L>R ankle overpronation  AROM  Thoracolumbar AROM Flexion 75% (no pain) Extension WNL (no pain) Lateral flexion: R 50%, L 100% Rotation: R 100%, L 75%   AROM (Normal range in degrees) AROM 03/04/24  Hip Right Left  Flexion (125) WNL   Extension (15)    Abduction (40)    Adduction     Internal Rotation (45) WNL   External Rotation (45) WNL       Knee    Flexion (135) WNL   Extension (0) WNL       (* = pain; Blank rows = not tested)  LE MMT: MMT (out of 5) Right 03/04/24 Left 03/04/24  Hip flexion 4+ 4+  Hip extension 4 4  Hip abduction 4+ 5  Hip adduction    Hip internal rotation 5 5  Hip external rotation 4 4+  Knee flexion 5 5  Knee extension 5 5  Ankle dorsiflexion    Ankle plantarflexion    Ankle inversion    Ankle eversion    (* = pain; Blank rows = not tested)  Reflexes R/L Knee Jerk (L3/4): 2+/2+ Ankle Jerk (S1/2): 2+/2+ Clonus: Negative bilat  Muscle Length Hamstrings: R: Positive L: Positive Quadriceps (Ely): R: Positive L: Positive  Palpation TTP pes anserinus 1, adductor longus 1 Graded on 0-4 scale (0 = no pain, 1 = pain, 2 = pain with wincing/grimacing/flinching, 3 = pain with withdrawal, 4 = unwilling to allow palpation)  VASCULAR Dorsalis pedis and posterior tibial pulses are palpable   SPECIAL TESTS  Lumbar Radiculopathy SLUMP: R Negative, L Negative SLR: R Negative, L Negative  Prone Knee Bend: R Positive, L Negative  Meniscus  Tests McMurray's Test:  Medial Meniscus (Tibial ER): R: Negative L: Negative Effusion: R: Negative L: Negative   Patellofemoral Pain Syndrome Patellar Tilt (Lateral): R: Not examined L: Not examined Patellar Apprehension: R: Negative L: Negative Squatting pain: R: Negative L: Negative Resisted knee extension pain: R: Negative L: Negative Compression: R: Not examined L: Not examined Clarke's sign: R: Not examined L: Not examined Lateral Pull: R: Negative L: Negative     TODAY'S TREATMENT          03/30/2024   SUBJECTIVE STATEMENT:   Patient reports one episode of sharp shooting pain yesterday that was not debilitating. Patient denies pain at arrival. She has comorbid cervicothoracic and periscapular pain at this time.   Manual Therapy - for symptom modulation, soft tissue sensitivity and mobility, joint mobility, ROM   STM along R vastus medialis and adductor longus/gracilis x 15 minutes    Trigger Point Dry Needling  Subsequent Treatment: Instructions provided previously at initial dry needling treatment.   Patient Verbal Consent Given: Yes Education Handout Provided: Yes Muscles Treated: R adductor longus and R vastus medialis x 2 with 0.30 x 60 mm single needle placements Electrical Stimulation Performed: Yes, 2 0.30 x 60 mm needles in  vastus medialis; Parameters: 1.5 milliamps at 5 Hz x 7.5 min, 2 microamps at 80 Hz x 7.5 min Treatment Response/Outcome:     Therapeutic Exercise - for improved soft tissue flexibility and extensibility as needed for ROM, neurodynamics to improve neural tension and reduce neuralgias/paresthesias  Seated elliptical; x 5 min, Level 7   Prone Quadriceps stretch; 2 x 30 sec   PATIENT EDUCATION: HEP reviewed. Discussed transition in POC over next week from DN/e-stim to exercise-based intervention.   *not today* Quadruped donkey kick; 2 x 10 on either side  Glute bridge, with alternating leg extension; 1x10 alt R/L Prone femoral nerve  flossing; 20x Prone femoral nerve tensioner; 20x Supine hip abduction stretch; 3 x 30 sec Supine active hamstring stretch; 20x   PATIENT EDUCATION:  Education details: see above for patient education details Person educated: Patient Education method: Explanation, Demonstration, and Handouts Education comprehension: verbalized understanding and returned demonstration   HOME EXERCISE PROGRAM:  Access Code: [email or web address] URL: [email or web address] Date: 03/22/2024 Prepared by: Consuela Mimes  Exercises - Prone Femoral Nerve Mobilization  - 3 x daily - 7 x weekly - 2 sets - 10 reps - 1-2sec hold - Supine Hamstring Stretch  - 3 x daily - 7 x weekly - 2 sets - 10 reps - 1-2sec hold - Supine Butterfly Groin Stretch  - 3 x daily - 7 x weekly - 3 sets - 30sec hold - Quadruped Hip Abduction and External Rotation  - 1 x daily - 4-5 x weekly - 2 sets - 10 reps - 3sec hold - Supine Bridge  - 1 x daily - 4-5 x weekly - 2 sets - 10 reps - 3sec hold   ASSESSMENT:  CLINICAL IMPRESSION: Patient has experienced intermittent episodes of pain affecting R thigh/medial knee complex which have been of lesser intensity per pt report. She has not experienced notable pain during time in clinic with exception of fleeting reproduction of symptoms with needle insertion. Pt does seem to be responding well so far; however, she is still controlling symptoms with Gabapentin. Pt is awaiting further workup for L-spine and may move to advanced imaging if symptoms persist. We will continue to monitor for any future flare-ups. Pt will continue to benefit from skilled PT services to address deficits and improve function.    OBJECTIVE IMPAIRMENTS: difficulty walking, decreased ROM, decreased strength, impaired flexibility, and pain.   ACTIVITY LIMITATIONS: lifting, standing, stairs, transfers, and locomotion level  PARTICIPATION LIMITATIONS: cleaning, shopping, and community activity  PERSONAL FACTORS:  Past/current experiences, Time since onset of injury/illness/exacerbation, and 1-2 comorbidities: (Depression, Positive ANA)  are also affecting patient's functional outcome.   REHAB POTENTIAL: Good  CLINICAL DECISION MAKING: Unstable/unpredictable  EVALUATION COMPLEXITY: High   GOALS: Goals reviewed with patient? Yes  SHORT TERM GOALS: Target date: 03/28/2024  Pt will be independent with HEP to improve strength and decrease knee pain to improve pain-free function at home and work. Baseline: 03/04/24: Baseline HEP initiated.  Goal status: INITIAL   LONG TERM GOALS: Target date: 04/18/2024  Pt will have no nocturnal symptoms disturbing sleep as needed for improved sleep quality and long-term wellness Baseline: 03/04/24: Frequent symptoms which occur sporadically even when at rest.  Goal status: INITIAL  2.  Pt will decrease worst knee pain by at least 3 points on the NPRS in order to demonstrate clinically significant reduction in knee pain. Baseline: 03/04/24: 10/10 pain at worst.  Goal status: INITIAL  3.  Pt will decrease LEFS score by at least 9  points in order demonstrate clinically significant reduction in knee pain/disability.       Baseline: 03/04/24:  Goal status: INITIAL    PLAN: PT FREQUENCY: 1-2x/week  PT DURATION: 6 weeks  PLANNED INTERVENTIONS: Therapeutic exercises, Therapeutic activity, Neuromuscular re-education, Balance training, Gait training, Patient/Family education, Self Care, Joint mobilization, Joint manipulation, Vestibular training, Canalith repositioning, Orthotic/Fit training, DME instructions, Dry Needling, Electrical stimulation, Spinal manipulation, Spinal mobilization, Cryotherapy, Moist heat, Taping, Traction, Ultrasound, Ionotophoresis 4mg /ml Dexamethasone, Manual therapy, and Re-evaluation.  PLAN FOR NEXT SESSION: Neurodynamics; soft tissue stretching for adductors/quads. Manual therapy for desensitization of affected tissues. F/u on dry needling  response.     Consuela Mimes, PT, DPT #[id number]  Gertie Exon, PT 03/30/2024, 8:09 AM

## 2024-04-01 ENCOUNTER — Encounter: Payer: Medicaid Other | Admitting: Physical Therapy

## 2024-04-06 ENCOUNTER — Ambulatory Visit: Admitting: Physical Therapy

## 2024-04-06 ENCOUNTER — Encounter: Payer: Self-pay | Admitting: Physical Therapy

## 2024-04-06 DIAGNOSIS — M79604 Pain in right leg: Secondary | ICD-10-CM | POA: Diagnosis not present

## 2024-04-06 DIAGNOSIS — R262 Difficulty in walking, not elsewhere classified: Secondary | ICD-10-CM

## 2024-04-06 NOTE — Therapy (Unsigned)
 OUTPATIENT PHYSICAL THERAPY TREATMENT  Patient Name: Rachel Ortega MRN: [id number] DOB:07/24/1996, 28 y.o., female Today's Date: 04/06/2024  END OF SESSION:  PT End of Session - 04/06/24 0954     Visit Number 7    Number of Visits 13    Date for PT Re-Evaluation 04/15/24    PT Start Time 0950    PT Stop Time 1029    PT Time Calculation (min) 39 min    Activity Tolerance Patient tolerated treatment well;No increased pain    Behavior During Therapy [facility] for tasks assessed/performed               Past Medical History:  Diagnosis Date   Depression    Medical history non-contributory    Preeclampsia, third trimester 02/05/2018   Past Surgical History:  Procedure Laterality Date   TONSILLECTOMY Bilateral    TONSILLECTOMY     WISDOM TOOTH EXTRACTION Bilateral    Patient Active Problem List   Diagnosis Date Noted   Chronic lower extremity pain (Right) 03/15/2024   Right lumbar radiculitis 03/09/2024   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) 03/09/2024   Transient weakness of right lower extremity 03/09/2024   Obesity, Class I, BMI 30-34.9 02/19/2024   Enthesopathy of knee region (Right) 01/27/2024   Acute knee pain (Right) 01/07/2024   Medial knee pain (Right) 01/07/2024   Postdural puncture headache 11/18/2023   Postpartum spinal headache 11/18/2023   Anxiety due to invasive procedure 11/18/2023   NSVD (normal spontaneous vaginal delivery) 11/13/2023   Gestational diabetes 11/11/2023   Postpartum hemorrhage 11/11/2023   Spotting affecting pregnancy 09/22/2023   Vaginal bleeding during pregnancy 09/22/2023   SS-A antibody positive 09/22/2023   Obesity affecting pregnancy 09/22/2023   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, antepartum 09/22/2023   Rash and nonspecific skin eruption 09/19/2023   Anxiety and depression 07/14/2023   History of pre-eclampsia in prior pregnancy, currently pregnant 07/14/2023   Positive ANA (antinuclear  antibody) 07/14/2023   Supervision of high risk pregnancy in second trimester 04/22/2023   Thrombocytopenia due to blood loss 02/07/2018   family H/O inherited metabolic disease 40/98/1191    PCP: Noralyn Pick, NP  REFERRING PROVIDER: Delano Metz, MD  REFERRING DIAG:  [phone] (ICD-10-CM) - Acute pain of right knee  M25.561 (ICD-10-CM) - Right medial knee pain  M76.891 (ICD-10-CM) - Enthesopathy of right knee region    RATIONALE FOR EVALUATION AND TREATMENT: Rehabilitation  THERAPY DIAG: Pain in right leg  Difficulty in walking, not elsewhere classified  ONSET DATE: 10/2023  FOLLOW-UP APPT SCHEDULED WITH REFERRING PROVIDER: Yes ; 03/09/24   PERTINENT HISTORY: Pt is a 28 year old female with primary c/o R knee/medial thigh/leg pain. Pt had injections on 02/19/24 in R knee that did not help. Patient reports negative experience with epidural, and she feels that her symptoms began after this. Patient reports burning "all of a sudden" pain that can go down to her foot. She states it is debilitating. Pt is taking Gabapentin for pain control. No significant integumentary change. Her child is 72 months old and she has 5-year-old. Pt reports doing well with childcare duties. Pt reports being unable to step on RLE when she is having episode of pain. Pt reports some nocturnal symptoms and even pain when at rest; sporadic onset of symptoms. Symptoms go away after 1-2 minutes. Pt is ANA positive; no active autoimmune disease.   PAIN:   Pain Intensity: Present: 0/10, Best: 0/10, Worst: 10/10 Pain location: anteromedial  knee along medial peripatellar region  Pain quality: burning Radiating pain: Yes ; radiating from distomedial thigh down to dorsum of foot Swelling: No  Popping, catching, locking: No  Numbness/Tingling: No Focal weakness or buckling: No Aggravating factors: Hurts sporadically without aggravating position/posture Relieving factors: Gabapentin, massage along distomedial  thigh/VMO  24-hour pain behavior: None  History of prior back, hip, or knee injury, pain, surgery, or therapy: Yes; hx of back pain and chiropractic   Imaging: Yes ;  X-rays negative  Prior level of function: Independent 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: Relaxing with family   Living Environment Lives with: lives with their partner, 75 y/o daughter, 76-month-old boy Lives in: House/apartment  Patient Goals: Knee pain gone; able to get off of Gabapentin    OBJECTIVE:   Patient Surveys  LEFS 12/80 = 15%  Gross Musculoskeletal Assessment Tremor: None Bulk: Normal Tone: Normal Pt squats with weight on forefoot and is able to maintain SLR well with control of pronation of ankles    GAIT: Distance walked: 50 ft Assistive device utilized: None Level of assistance: Complete Independence Comments: L>R ankle overpronation  AROM  Thoracolumbar AROM Flexion 75% (no pain) Extension WNL (no pain) Lateral flexion: R 50%, L 100% Rotation: R 100%, L 75%   AROM (Normal range in degrees) AROM 03/04/24  Hip Right Left  Flexion (125) WNL   Extension (15)    Abduction (40)    Adduction     Internal Rotation (45) WNL   External Rotation (45) WNL       Knee    Flexion (135) WNL   Extension (0) WNL       (* = pain; Blank rows = not tested)  LE MMT: MMT (out of 5) Right 03/04/24 Left 03/04/24  Hip flexion 4+ 4+  Hip extension 4 4  Hip abduction 4+ 5  Hip adduction    Hip internal rotation 5 5  Hip external rotation 4 4+  Knee flexion 5 5  Knee extension 5 5  Ankle dorsiflexion    Ankle plantarflexion    Ankle inversion    Ankle eversion    (* = pain; Blank rows = not tested)  Reflexes R/L Knee Jerk (L3/4): 2+/2+ Ankle Jerk (S1/2): 2+/2+ Clonus: Negative bilat  Muscle Length Hamstrings: R: Positive L: Positive Quadriceps (Ely): R: Positive L: Positive  Palpation TTP pes anserinus 1, adductor longus 1 Graded on 0-4 scale (0 = no pain, 1 =  pain, 2 = pain with wincing/grimacing/flinching, 3 = pain with withdrawal, 4 = unwilling to allow palpation)  VASCULAR Dorsalis pedis and posterior tibial pulses are palpable   SPECIAL TESTS  Lumbar Radiculopathy SLUMP: R Negative, L Negative SLR: R Negative, L Negative  Prone Knee Bend: R Positive, L Negative  Meniscus Tests McMurray's Test:  Medial Meniscus (Tibial ER): R: Negative L: Negative Effusion: R: Negative L: Negative   Patellofemoral Pain Syndrome Patellar Tilt (Lateral): R: Not examined L: Not examined Patellar Apprehension: R: Negative L: Negative Squatting pain: R: Negative L: Negative Resisted knee extension pain: R: Negative L: Negative Compression: R: Not examined L: Not examined Clarke's sign: R: Not examined L: Not examined Lateral Pull: R: Negative L: Negative     TODAY'S TREATMENT          04/06/2024   SUBJECTIVE STATEMENT:   Patient reports no further episodes of severe pain or flare-ups since last visit. Patient has L-spine MRI planned for tomorrow.    Manual Therapy - for symptom modulation, soft tissue  sensitivity and mobility, joint mobility, ROM   STM and IASTM with Theraband roller along R vastus medialis and adductor longus/gracilis x 10 minutes    Therapeutic Exercise - for improved soft tissue flexibility and extensibility as needed for ROM, neurodynamics to improve neural tension and reduce neuralgias/paresthesias  Seated elliptical; x 6 min, Level 7   -interval subjective gathered   Quadruped Academic librarian; 2 x 10, performed on each LE   Bird dog; 2 x 10, alternating R/L  Sidelying clamshell;  2 x 8, Green Tband   Glute bridge, with alternating leg extension; 3x5 alt R/L  Dying bug, hooklying, LE hover to end of bed; 2x10 alternating upper/lower limbs    PATIENT EDUCATION: HEP reviewed. Discussed transition in POC moving forward with use of manual/DN as needed.   *not today* Prone Quadriceps stretch; 2 x 30 sec Quadruped  donkey kick; 2 x 10 on either side  Prone femoral nerve flossing; 20x Prone femoral nerve tensioner; 20x Supine hip abduction stretch; 3 x 30 sec Supine active hamstring stretch; 20x   PATIENT EDUCATION:  Education details: see above for patient education details Person educated: Patient Education method: Explanation, Demonstration, and Handouts Education comprehension: verbalized understanding and returned demonstration   HOME EXERCISE PROGRAM:  Access Code: [email or web address] URL: [email or web address] Date: 03/22/2024 Prepared by: Denese Finn  Exercises - Prone Femoral Nerve Mobilization  - 3 x daily - 7 x weekly - 2 sets - 10 reps - 1-2sec hold - Supine Hamstring Stretch  - 3 x daily - 7 x weekly - 2 sets - 10 reps - 1-2sec hold - Supine Butterfly Groin Stretch  - 3 x daily - 7 x weekly - 3 sets - 30sec hold - Quadruped Hip Abduction and External Rotation  - 1 x daily - 4-5 x weekly - 2 sets - 10 reps - 3sec hold - Supine Bridge  - 1 x daily - 4-5 x weekly - 2 sets - 10 reps - 3sec hold   ASSESSMENT:  CLINICAL IMPRESSION: Patient has lumbar spine MRI tomorrow. She has fortunately not experienced further flare-ups recently. We are able to progress with focus on hip strengthening and trunk stabilization today -  pt to continue with neurodynamics at home. Pt will continue to benefit from skilled PT services to address deficits and improve function.    OBJECTIVE IMPAIRMENTS: difficulty walking, decreased ROM, decreased strength, impaired flexibility, and pain.   ACTIVITY LIMITATIONS: lifting, standing, stairs, transfers, and locomotion level  PARTICIPATION LIMITATIONS: cleaning, shopping, and community activity  PERSONAL FACTORS: Past/current experiences, Time since onset of injury/illness/exacerbation, and 1-2 comorbidities: (Depression, Positive ANA)  are also affecting patient's functional outcome.   REHAB POTENTIAL: Good  CLINICAL DECISION MAKING:  Unstable/unpredictable  EVALUATION COMPLEXITY: High   GOALS: Goals reviewed with patient? Yes  SHORT TERM GOALS: Target date: 03/28/2024  Pt will be independent with HEP to improve strength and decrease knee pain to improve pain-free function at home and work. Baseline: 03/04/24: Baseline HEP initiated.  Goal status: INITIAL   LONG TERM GOALS: Target date: 04/18/2024  Pt will have no nocturnal symptoms disturbing sleep as needed for improved sleep quality and long-term wellness Baseline: 03/04/24: Frequent symptoms which occur sporadically even when at rest.  Goal status: INITIAL  2.  Pt will decrease worst knee pain by at least 3 points on the NPRS in order to demonstrate clinically significant reduction in knee pain. Baseline: 03/04/24: 10/10 pain at worst.  Goal status: INITIAL  3.  Pt will decrease  LEFS score by at least 9 points in order demonstrate clinically significant reduction in knee pain/disability.       Baseline: 03/04/24:  Goal status: INITIAL    PLAN: PT FREQUENCY: 1-2x/week  PT DURATION: 6 weeks  PLANNED INTERVENTIONS: Therapeutic exercises, Therapeutic activity, Neuromuscular re-education, Balance training, Gait training, Patient/Family education, Self Care, Joint mobilization, Joint manipulation, Vestibular training, Canalith repositioning, Orthotic/Fit training, DME instructions, Dry Needling, Electrical stimulation, Spinal manipulation, Spinal mobilization, Cryotherapy, Moist heat, Taping, Traction, Ultrasound, Ionotophoresis 4mg /ml Dexamethasone, Manual therapy, and Re-evaluation.  PLAN FOR NEXT SESSION: Neurodynamics; soft tissue stretching for adductors/quads. Manual therapy for desensitization of affected tissues. F/u on dry needling response.     Denese Finn, PT, DPT #[id number]  Aleatha Hunting, PT 04/06/2024, 9:54 AM

## 2024-04-07 ENCOUNTER — Ambulatory Visit
Admission: RE | Admit: 2024-04-07 | Discharge: 2024-04-07 | Disposition: A | Discharge status: Home / Self Care | Source: Ambulatory Visit | Attending: Pain Medicine | Admitting: Pain Medicine

## 2024-04-07 DIAGNOSIS — G8929 Other chronic pain: Secondary | ICD-10-CM | POA: Insufficient documentation

## 2024-04-07 DIAGNOSIS — M5441 Lumbago with sciatica, right side: Secondary | ICD-10-CM | POA: Insufficient documentation

## 2024-04-07 DIAGNOSIS — R29898 Other symptoms and signs involving the musculoskeletal system: Secondary | ICD-10-CM | POA: Diagnosis present

## 2024-04-07 DIAGNOSIS — M5416 Radiculopathy, lumbar region: Secondary | ICD-10-CM | POA: Insufficient documentation

## 2024-04-08 ENCOUNTER — Ambulatory Visit: Payer: Medicaid Other | Admitting: Physical Therapy

## 2024-04-08 DIAGNOSIS — R262 Difficulty in walking, not elsewhere classified: Secondary | ICD-10-CM

## 2024-04-08 DIAGNOSIS — M79604 Pain in right leg: Secondary | ICD-10-CM | POA: Diagnosis not present

## 2024-04-08 NOTE — Therapy (Signed)
 OUTPATIENT PHYSICAL THERAPY TREATMENT  Patient Name: Rachel Ortega MRN: [id number] DOB:1996/08/19, 28 y.o., female Today's Date: 04/08/2024  END OF SESSION:  PT End of Session - 04/08/24 1001     Visit Number 8    Number of Visits 13    Date for PT Re-Evaluation 04/15/24    PT Start Time 0951    PT Stop Time 1030    PT Time Calculation (min) 39 min    Activity Tolerance Patient tolerated treatment well;No increased pain    Behavior During Therapy [facility] for tasks assessed/performed                Past Medical History:  Diagnosis Date   Depression    Medical history non-contributory    Preeclampsia, third trimester 02/05/2018   Past Surgical History:  Procedure Laterality Date   TONSILLECTOMY Bilateral    TONSILLECTOMY     WISDOM TOOTH EXTRACTION Bilateral    Patient Active Problem List   Diagnosis Date Noted   Chronic lower extremity pain (Right) 03/15/2024   Right lumbar radiculitis 03/09/2024   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) 03/09/2024   Transient weakness of right lower extremity 03/09/2024   Obesity, Class I, BMI 30-34.9 02/19/2024   Enthesopathy of knee region (Right) 01/27/2024   Acute knee pain (Right) 01/07/2024   Medial knee pain (Right) 01/07/2024   Postdural puncture headache 11/18/2023   Postpartum spinal headache 11/18/2023   Anxiety due to invasive procedure 11/18/2023   NSVD (normal spontaneous vaginal delivery) 11/13/2023   Gestational diabetes 11/11/2023   Postpartum hemorrhage 11/11/2023   Spotting affecting pregnancy 09/22/2023   Vaginal bleeding during pregnancy 09/22/2023   SS-A antibody positive 09/22/2023   Obesity affecting pregnancy 09/22/2023   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, antepartum 09/22/2023   Rash and nonspecific skin eruption 09/19/2023   Anxiety and depression 07/14/2023   History of pre-eclampsia in prior pregnancy, currently pregnant 07/14/2023   Positive ANA (antinuclear  antibody) 07/14/2023   Supervision of high risk pregnancy in second trimester 04/22/2023   Thrombocytopenia due to blood loss 02/07/2018   family H/O inherited metabolic disease 78/29/5621    PCP: Laurence Pons, NP  REFERRING PROVIDER: Renaldo Caroli, MD  REFERRING DIAG:  [phone] (ICD-10-CM) - Acute pain of right knee  M25.561 (ICD-10-CM) - Right medial knee pain  M76.891 (ICD-10-CM) - Enthesopathy of right knee region    RATIONALE FOR EVALUATION AND TREATMENT: Rehabilitation  THERAPY DIAG: Difficulty in walking, not elsewhere classified  Pain in right leg  ONSET DATE: 10/2023  FOLLOW-UP APPT SCHEDULED WITH REFERRING PROVIDER: Yes ; 03/09/24   PERTINENT HISTORY: Pt is a 29 year old female with primary c/o R knee/medial thigh/leg pain. Pt had injections on 02/19/24 in R knee that did not help. Patient reports negative experience with epidural, and she feels that her symptoms began after this. Patient reports burning "all of a sudden" pain that can go down to her foot. She states it is debilitating. Pt is taking Gabapentin for pain control. No significant integumentary change. Her child is 8 months old and she has 67-year-old. Pt reports doing well with childcare duties. Pt reports being unable to step on RLE when she is having episode of pain. Pt reports some nocturnal symptoms and even pain when at rest; sporadic onset of symptoms. Symptoms go away after 1-2 minutes. Pt is ANA positive; no active autoimmune disease.   PAIN:   Pain Intensity: Present: 0/10, Best: 0/10, Worst: 10/10 Pain location:  anteromedial knee along medial peripatellar region  Pain quality: burning Radiating pain: Yes ; radiating from distomedial thigh down to dorsum of foot Swelling: No  Popping, catching, locking: No  Numbness/Tingling: No Focal weakness or buckling: No Aggravating factors: Hurts sporadically without aggravating position/posture Relieving factors: Gabapentin, massage along distomedial  thigh/VMO  24-hour pain behavior: None  History of prior back, hip, or knee injury, pain, surgery, or therapy: Yes; hx of back pain and chiropractic   Imaging: Yes ;  X-rays negative  Prior level of function: Independent 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: Relaxing with family   Living Environment Lives with: lives with their partner, 3 y/o daughter, 48-month-old boy Lives in: House/apartment  Patient Goals: Knee pain gone; able to get off of Gabapentin    OBJECTIVE:   Patient Surveys  LEFS 12/80 = 15%  Gross Musculoskeletal Assessment Tremor: None Bulk: Normal Tone: Normal Pt squats with weight on forefoot and is able to maintain SLR well with control of pronation of ankles    GAIT: Distance walked: 50 ft Assistive device utilized: None Level of assistance: Complete Independence Comments: L>R ankle overpronation  AROM  Thoracolumbar AROM Flexion 75% (no pain) Extension WNL (no pain) Lateral flexion: R 50%, L 100% Rotation: R 100%, L 75%   AROM (Normal range in degrees) AROM 03/04/24  Hip Right Left  Flexion (125) WNL   Extension (15)    Abduction (40)    Adduction     Internal Rotation (45) WNL   External Rotation (45) WNL       Knee    Flexion (135) WNL   Extension (0) WNL       (* = pain; Blank rows = not tested)  LE MMT: MMT (out of 5) Right 03/04/24 Left 03/04/24  Hip flexion 4+ 4+  Hip extension 4 4  Hip abduction 4+ 5  Hip adduction    Hip internal rotation 5 5  Hip external rotation 4 4+  Knee flexion 5 5  Knee extension 5 5  Ankle dorsiflexion    Ankle plantarflexion    Ankle inversion    Ankle eversion    (* = pain; Blank rows = not tested)  Reflexes R/L Knee Jerk (L3/4): 2+/2+ Ankle Jerk (S1/2): 2+/2+ Clonus: Negative bilat  Muscle Length Hamstrings: R: Positive L: Positive Quadriceps (Ely): R: Positive L: Positive  Palpation TTP pes anserinus 1, adductor longus 1 Graded on 0-4 scale (0 = no pain, 1 =  pain, 2 = pain with wincing/grimacing/flinching, 3 = pain with withdrawal, 4 = unwilling to allow palpation)  VASCULAR Dorsalis pedis and posterior tibial pulses are palpable   SPECIAL TESTS  Lumbar Radiculopathy SLUMP: R Negative, L Negative SLR: R Negative, L Negative  Prone Knee Bend: R Positive, L Negative  Meniscus Tests McMurray's Test:  Medial Meniscus (Tibial ER): R: Negative L: Negative Effusion: R: Negative L: Negative   Patellofemoral Pain Syndrome Patellar Tilt (Lateral): R: Not examined L: Not examined Patellar Apprehension: R: Negative L: Negative Squatting pain: R: Negative L: Negative Resisted knee extension pain: R: Negative L: Negative Compression: R: Not examined L: Not examined Clarke's sign: R: Not examined L: Not examined Lateral Pull: R: Negative L: Negative     TODAY'S TREATMENT          04/08/2024   SUBJECTIVE STATEMENT:   Patient had MRI yesterday; pt is awaiting results/radiology report. Pt reports two episodes of R medial thigh/knee pain this past week, though intensity did not reach highest level previously experienced.  Pt is compliant with HEP.    Manual Therapy - for symptom modulation, soft tissue sensitivity and mobility, joint mobility, ROM   STM and IASTM with Theraband roller along R vastus medialis and adductor longus/gracilis x 12 minutes   Trigger Point Dry Needling   Subsequent Treatment: Instructions provided previously at initial dry needling treatment.    Patient Verbal Consent Given: Yes Education Handout Provided: Yes Muscles Treated: R adductor longus and R vastus medialis x 2 with 0.30 x 60 mm single needle placements Electrical Stimulation Performed: Yes, 2 0.30 x 60 mm needles in vastus medialis; Parameters: 2.5 milliamps at 5 Hz x 7.5 min, 2.5 microamps at 80 Hz x 7.5 min Treatment Response/Outcome: No notable post-treatment pain   Therapeutic Exercise - for improved soft tissue flexibility and extensibility as needed  for ROM, neurodynamics to improve neural tension and reduce neuralgias/paresthesias  Seated elliptical; x 6 min, Level 7   -interval subjective gathered   SLR: Negative Prone knee bend: Negative for back/leg pain Ely's Test: R Positive, L Negative   PATIENT EDUCATION: HEP reviewed. Discussed transition in POC moving forward with use of manual/DN as needed.   *not today* Quadruped Academic librarian; 2 x 10, performed on each LE  Bird dog; 2 x 10, alternating R/L Sidelying clamshell;  2 x 8, Green Tband  Glute bridge, with alternating leg extension; 3x5 alt R/L Dying bug, hooklying, LE hover to end of bed; 2x10 alternating upper/lower limbs  Prone Quadriceps stretch; 2 x 30 sec Quadruped donkey kick; 2 x 10 on either side  Prone femoral nerve flossing; 20x Prone femoral nerve tensioner; 20x Supine hip abduction stretch; 3 x 30 sec Supine active hamstring stretch; 20x   PATIENT EDUCATION:  Education details: see above for patient education details Person educated: Patient Education method: Explanation, Demonstration, and Handouts Education comprehension: verbalized understanding and returned demonstration   HOME EXERCISE PROGRAM:  Access Code: [email or web address] URL: [email or web address] Date: 03/22/2024 Prepared by: Denese Finn  Exercises - Prone Femoral Nerve Mobilization  - 3 x daily - 7 x weekly - 2 sets - 10 reps - 1-2sec hold - Supine Hamstring Stretch  - 3 x daily - 7 x weekly - 2 sets - 10 reps - 1-2sec hold - Supine Butterfly Groin Stretch  - 3 x daily - 7 x weekly - 3 sets - 30sec hold - Quadruped Hip Abduction and External Rotation  - 1 x daily - 4-5 x weekly - 2 sets - 10 reps - 3sec hold - Supine Bridge  - 1 x daily - 4-5 x weekly - 2 sets - 10 reps - 3sec hold   ASSESSMENT:  CLINICAL IMPRESSION: Patient is awaiting formal MRI report from radiologist. She had two episodes of fleeting medial knee/thigh pain this past week of moderate intensity. She  denies recent sleep disturbance. Pt tolerated recent increase in exercise intensity well, but she has experienced intermittent flare-ups over the week. We re-visited DN + e-stim today to modulate medial thigh/knee pain given good response to date. Pt will continue to benefit from skilled PT services to address deficits and improve function.    OBJECTIVE IMPAIRMENTS: difficulty walking, decreased ROM, decreased strength, impaired flexibility, and pain.   ACTIVITY LIMITATIONS: lifting, standing, stairs, transfers, and locomotion level  PARTICIPATION LIMITATIONS: cleaning, shopping, and community activity  PERSONAL FACTORS: Past/current experiences, Time since onset of injury/illness/exacerbation, and 1-2 comorbidities: (Depression, Positive ANA)  are also affecting patient's functional outcome.   REHAB POTENTIAL: Good  CLINICAL DECISION MAKING:  Unstable/unpredictable  EVALUATION COMPLEXITY: High   GOALS: Goals reviewed with patient? Yes  SHORT TERM GOALS: Target date: 03/28/2024  Pt will be independent with HEP to improve strength and decrease knee pain to improve pain-free function at home and work. Baseline: 03/04/24: Baseline HEP initiated.  Goal status: INITIAL   LONG TERM GOALS: Target date: 04/18/2024  Pt will have no nocturnal symptoms disturbing sleep as needed for improved sleep quality and long-term wellness Baseline: 03/04/24: Frequent symptoms which occur sporadically even when at rest.  Goal status: INITIAL  2.  Pt will decrease worst knee pain by at least 3 points on the NPRS in order to demonstrate clinically significant reduction in knee pain. Baseline: 03/04/24: 10/10 pain at worst.  Goal status: INITIAL  3.  Pt will decrease LEFS score by at least 9 points in order demonstrate clinically significant reduction in knee pain/disability.       Baseline: 03/04/24:  Goal status: INITIAL    PLAN: PT FREQUENCY: 1-2x/week  PT DURATION: 6 weeks  PLANNED INTERVENTIONS:  Therapeutic exercises, Therapeutic activity, Neuromuscular re-education, Balance training, Gait training, Patient/Family education, Self Care, Joint mobilization, Joint manipulation, Vestibular training, Canalith repositioning, Orthotic/Fit training, DME instructions, Dry Needling, Electrical stimulation, Spinal manipulation, Spinal mobilization, Cryotherapy, Moist heat, Taping, Traction, Ultrasound, Ionotophoresis 4mg /ml Dexamethasone, Manual therapy, and Re-evaluation.  PLAN FOR NEXT SESSION: Neurodynamics; soft tissue stretching for adductors/quads. Manual therapy for desensitization of affected tissues. F/u on dry needling response.    Denese Finn, PT, DPT #[id number]  Aleatha Hunting, PT 04/08/2024, 10:03 AM

## 2024-04-13 ENCOUNTER — Ambulatory Visit: Admitting: Physical Therapy

## 2024-04-15 ENCOUNTER — Ambulatory Visit: Payer: Medicaid Other | Admitting: Physical Therapy

## 2024-04-15 ENCOUNTER — Encounter: Payer: Self-pay | Admitting: Physical Therapy

## 2024-04-15 DIAGNOSIS — M79604 Pain in right leg: Secondary | ICD-10-CM

## 2024-04-15 DIAGNOSIS — R262 Difficulty in walking, not elsewhere classified: Secondary | ICD-10-CM

## 2024-04-15 NOTE — Therapy (Signed)
 OUTPATIENT PHYSICAL THERAPY TREATMENT  Patient Name: Rachel Ortega MRN: [id number] DOB:Nov 11, 1996, 28 y.o., female Today's Date: 04/15/2024  END OF SESSION:  PT End of Session - 04/15/24 0953     Visit Number 9    Number of Visits 13    Date for PT Re-Evaluation 04/15/24    PT Start Time 0947    PT Stop Time 1028    PT Time Calculation (min) 41 min    Activity Tolerance Patient tolerated treatment well;No increased pain    Behavior During Therapy [facility] for tasks assessed/performed             Past Medical History:  Diagnosis Date   Depression    Medical history non-contributory    Preeclampsia, third trimester 02/05/2018   Past Surgical History:  Procedure Laterality Date   TONSILLECTOMY Bilateral    TONSILLECTOMY     WISDOM TOOTH EXTRACTION Bilateral    Patient Active Problem List   Diagnosis Date Noted   Chronic lower extremity pain (Right) 03/15/2024   Right lumbar radiculitis 03/09/2024   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) 03/09/2024   Transient weakness of right lower extremity 03/09/2024   Obesity, Class I, BMI 30-34.9 02/19/2024   Enthesopathy of knee region (Right) 01/27/2024   Acute knee pain (Right) 01/07/2024   Medial knee pain (Right) 01/07/2024   Postdural puncture headache 11/18/2023   Postpartum spinal headache 11/18/2023   Anxiety due to invasive procedure 11/18/2023   NSVD (normal spontaneous vaginal delivery) 11/13/2023   Gestational diabetes 11/11/2023   Postpartum hemorrhage 11/11/2023   Spotting affecting pregnancy 09/22/2023   Vaginal bleeding during pregnancy 09/22/2023   SS-A antibody positive 09/22/2023   Obesity affecting pregnancy 09/22/2023   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, antepartum 09/22/2023   Rash and nonspecific skin eruption 09/19/2023   Anxiety and depression 07/14/2023   History of pre-eclampsia in prior pregnancy, currently pregnant 07/14/2023   Positive ANA (antinuclear  antibody) 07/14/2023   Supervision of high risk pregnancy in second trimester 04/22/2023   Thrombocytopenia due to blood loss 02/07/2018   family H/O inherited metabolic disease 40/98/1191    PCP: Laurence Pons, NP  REFERRING PROVIDER: Renaldo Caroli, MD  REFERRING DIAG:  [phone] (ICD-10-CM) - Acute pain of right knee  M25.561 (ICD-10-CM) - Right medial knee pain  M76.891 (ICD-10-CM) - Enthesopathy of right knee region    RATIONALE FOR EVALUATION AND TREATMENT: Rehabilitation  THERAPY DIAG: Pain in right leg  Difficulty in walking, not elsewhere classified  ONSET DATE: 10/2023  FOLLOW-UP APPT SCHEDULED WITH REFERRING PROVIDER: Yes ; 03/09/24   PERTINENT HISTORY: Pt is a 28 year old female with primary c/o R knee/medial thigh/leg pain. Pt had injections on 02/19/24 in R knee that did not help. Patient reports negative experience with epidural, and she feels that her symptoms began after this. Patient reports burning "all of a sudden" pain that can go down to her foot. She states it is debilitating. Pt is taking Gabapentin for pain control. No significant integumentary change. Her child is 63 months old and she has 65-year-old. Pt reports doing well with childcare duties. Pt reports being unable to step on RLE when she is having episode of pain. Pt reports some nocturnal symptoms and even pain when at rest; sporadic onset of symptoms. Symptoms go away after 1-2 minutes. Pt is ANA positive; no active autoimmune disease.   PAIN:   Pain Intensity: Present: 0/10, Best: 0/10, Worst: 10/10 Pain location: anteromedial knee along  medial peripatellar region  Pain quality: burning Radiating pain: Yes ; radiating from distomedial thigh down to dorsum of foot Swelling: No  Popping, catching, locking: No  Numbness/Tingling: No Focal weakness or buckling: No Aggravating factors: Hurts sporadically without aggravating position/posture Relieving factors: Gabapentin, massage along distomedial  thigh/VMO  24-hour pain behavior: None  History of prior back, hip, or knee injury, pain, surgery, or therapy: Yes; hx of back pain and chiropractic   Imaging: Yes ;  X-rays negative  Prior level of function: Independent 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: Relaxing with family   Living Environment Lives with: lives with their partner, 18 y/o daughter, 85-month-old boy Lives in: House/apartment  Patient Goals: Knee pain gone; able to get off of Gabapentin    OBJECTIVE:   Patient Surveys  LEFS 12/80 = 15%  Gross Musculoskeletal Assessment Tremor: None Bulk: Normal Tone: Normal Pt squats with weight on forefoot and is able to maintain SLR well with control of pronation of ankles    GAIT: Distance walked: 50 ft Assistive device utilized: None Level of assistance: Complete Independence Comments: L>R ankle overpronation  AROM  Thoracolumbar AROM Flexion 75% (no pain) Extension WNL (no pain) Lateral flexion: R 50%, L 100% Rotation: R 100%, L 75%   AROM (Normal range in degrees) AROM 03/04/24  Hip Right Left  Flexion (125) WNL   Extension (15)    Abduction (40)    Adduction     Internal Rotation (45) WNL   External Rotation (45) WNL       Knee    Flexion (135) WNL   Extension (0) WNL       (* = pain; Blank rows = not tested)  LE MMT: MMT (out of 5) Right 03/04/24 Left 03/04/24  Hip flexion 4+ 4+  Hip extension 4 4  Hip abduction 4+ 5  Hip adduction    Hip internal rotation 5 5  Hip external rotation 4 4+  Knee flexion 5 5  Knee extension 5 5  Ankle dorsiflexion    Ankle plantarflexion    Ankle inversion    Ankle eversion    (* = pain; Blank rows = not tested)  Reflexes R/L Knee Jerk (L3/4): 2+/2+ Ankle Jerk (S1/2): 2+/2+ Clonus: Negative bilat  Muscle Length Hamstrings: R: Positive L: Positive Quadriceps ([place]): R: Positive L: Positive  Palpation TTP pes anserinus 1, adductor longus 1 Graded on 0-4 scale (0 = no pain, 1 =  pain, 2 = pain with wincing/grimacing/flinching, 3 = pain with withdrawal, 4 = unwilling to allow palpation)  VASCULAR Dorsalis pedis and posterior tibial pulses are palpable   SPECIAL TESTS  Lumbar Radiculopathy SLUMP: R Negative, L Negative SLR: R Negative, L Negative  Prone Knee Bend: R Positive, L Negative  Meniscus Tests McMurray's Test:  Medial Meniscus (Tibial ER): R: Negative L: Negative Effusion: R: Negative L: Negative   Patellofemoral Pain Syndrome Patellar Tilt (Lateral): R: Not examined L: Not examined Patellar Apprehension: R: Negative L: Negative Squatting pain: R: Negative L: Negative Resisted knee extension pain: R: Negative L: Negative Compression: R: Not examined L: Not examined Clarke's sign: R: Not examined L: Not examined Lateral Pull: R: Negative L: Negative     TODAY'S TREATMENT          04/15/2024   SUBJECTIVE STATEMENT:   Patient is still awaiting her MRI results. Pt reports no notable flare-up since her last PT visit. Pt reports tolerating last visit well.    Manual Therapy - for symptom modulation, soft  tissue sensitivity and mobility, joint mobility, ROM   STM and IASTM with Theraband roller along R vastus medialis and adductor longus/gracilis x 10 minutes   Trigger Point Dry Needling   Subsequent Treatment: Instructions provided previously at initial dry needling treatment.    Patient Verbal Consent Given: Yes Education Handout Provided: Yes Muscles Treated: R adductor magnus and gracilis with 0.30 x 60 mm single needle placements Electrical Stimulation Performed: No Treatment Response/Outcome: No notable post-treatment pain   Therapeutic Exercise - for improved soft tissue flexibility and extensibility as needed for ROM, neurodynamics to improve neural tension and reduce neuralgias/paresthesias  Seated elliptical; x 8 min, Level 7   -interval subjective gathered, 3 minutes not billed  Supine hip abduction stretch, with blanket  looped around foot; 3 x 30 sec  Quadruped fire hydrant; 2 x 10, performed on each LE   Bird dog; 2 x 10, alternating R/L  Prone knee bend test: R , L   Sidelying clamshell;  2 x 10 on each side, Green Tband   Prone hip extension, alternating; 2 x 10   PATIENT EDUCATION: HEP reviewed. Discussed continuing with neurodynamics and stretching/progressive strengthening as tolerated.   *not today* Glute bridge, with alternating leg extension; 3x5 alt R/L Dying bug, hooklying, LE hover to end of bed; 2x10 alternating upper/lower limbs  Prone Quadriceps stretch; 2 x 30 sec Quadruped donkey kick; 2 x 10 on either side  Prone femoral nerve flossing; 20x Prone femoral nerve tensioner; 20x Supine active hamstring stretch; 20x   PATIENT EDUCATION:  Education details: see above for patient education details Person educated: Patient Education method: Explanation, Demonstration, and Handouts Education comprehension: verbalized understanding and returned demonstration   HOME EXERCISE PROGRAM:  Access Code: [email or web address] URL: [email or web address] Date: 03/22/2024 Prepared by: Denese Finn  Exercises - Prone Femoral Nerve Mobilization  - 3 x daily - 7 x weekly - 2 sets - 10 reps - 1-2sec hold - Supine Hamstring Stretch  - 3 x daily - 7 x weekly - 2 sets - 10 reps - 1-2sec hold - Supine Butterfly Groin Stretch  - 3 x daily - 7 x weekly - 3 sets - 30sec hold - Quadruped Hip Abduction and External Rotation  - 1 x daily - 4-5 x weekly - 2 sets - 10 reps - 3sec hold - Supine Bridge  - 1 x daily - 4-5 x weekly - 2 sets - 10 reps - 3sec hold   ASSESSMENT:  CLINICAL IMPRESSION: Patient fortunately has not had notable re-occurrence of leg pain since her last visit. At end of session today, she does attest to experiencing some tightness in her low back intermittently with some at end of session today. We can further check for L-spine provocative tests and lumbar spine repeated  movement next time to check for effect on LE symptoms. Early in POC, we completed testing to rule out radiculopathy, though results can be affected by pain being controlled with Gabapentin. Pt will continue to benefit from skilled PT services to address deficits and improve function.    OBJECTIVE IMPAIRMENTS: difficulty walking, decreased ROM, decreased strength, impaired flexibility, and pain.   ACTIVITY LIMITATIONS: lifting, standing, stairs, transfers, and locomotion level  PARTICIPATION LIMITATIONS: cleaning, shopping, and community activity  PERSONAL FACTORS: Past/current experiences, Time since onset of injury/illness/exacerbation, and 1-2 comorbidities: (Depression, Positive ANA)  are also affecting patient's functional outcome.   REHAB POTENTIAL: Good  CLINICAL DECISION MAKING: Unstable/unpredictable  EVALUATION COMPLEXITY: High   GOALS: Goals reviewed with patient?  Yes  SHORT TERM GOALS: Target date: 03/28/2024  Pt will be independent with HEP to improve strength and decrease knee pain to improve pain-free function at home and work. Baseline: 03/04/24: Baseline HEP initiated.  Goal status: INITIAL   LONG TERM GOALS: Target date: 04/18/2024  Pt will have no nocturnal symptoms disturbing sleep as needed for improved sleep quality and long-term wellness Baseline: 03/04/24: Frequent symptoms which occur sporadically even when at rest.  Goal status: INITIAL  2.  Pt will decrease worst knee pain by at least 3 points on the NPRS in order to demonstrate clinically significant reduction in knee pain. Baseline: 03/04/24: 10/10 pain at worst.  Goal status: INITIAL  3.  Pt will decrease LEFS score by at least 9 points in order demonstrate clinically significant reduction in knee pain/disability.       Baseline: 03/04/24:  Goal status: INITIAL    PLAN: PT FREQUENCY: 1-2x/week  PT DURATION: 6 weeks  PLANNED INTERVENTIONS: Therapeutic exercises, Therapeutic activity, Neuromuscular  re-education, Balance training, Gait training, Patient/Family education, Self Care, Joint mobilization, Joint manipulation, Vestibular training, Canalith repositioning, Orthotic/Fit training, DME instructions, Dry Needling, Electrical stimulation, Spinal manipulation, Spinal mobilization, Cryotherapy, Moist heat, Taping, Traction, Ultrasound, Ionotophoresis 4mg /ml Dexamethasone, Manual therapy, and Re-evaluation.  PLAN FOR NEXT SESSION: Neurodynamics; soft tissue stretching for adductors/quads. Manual therapy for desensitization of affected tissues. F/u on dry needling response.    Denese Finn, PT, DPT #[id number]  Aleatha Hunting, PT 04/15/2024, 9:53 AM

## 2024-04-20 ENCOUNTER — Ambulatory Visit: Admitting: Physical Therapy

## 2024-04-20 ENCOUNTER — Encounter: Payer: Self-pay | Admitting: Physical Therapy

## 2024-04-20 ENCOUNTER — Telehealth: Payer: Self-pay | Admitting: Pain Medicine

## 2024-04-20 DIAGNOSIS — M79604 Pain in right leg: Secondary | ICD-10-CM | POA: Diagnosis not present

## 2024-04-20 DIAGNOSIS — R262 Difficulty in walking, not elsewhere classified: Secondary | ICD-10-CM

## 2024-04-20 NOTE — Telephone Encounter (Signed)
 PT stated that she had MRI due on the 16th of April. Wants to know if her results are in. Please give patient a call. TY

## 2024-04-20 NOTE — Telephone Encounter (Signed)
 Patient informed MRI has not yet been read.

## 2024-04-20 NOTE — Therapy (Signed)
 OUTPATIENT PHYSICAL THERAPY TREATMENT AND PROGRESS NOTE   Dates of reporting period  03/04/24   to   04/20/24   Patient Name: Rachel Ortega MRN: [id number] DOB:25-Jul-1996, 28 y.o., female Today's Date: 04/20/2024  END OF SESSION:  PT End of Session - 04/20/24 0948     Visit Number 10    Number of Visits 13    Date for PT Re-Evaluation 04/15/24    PT Start Time 0947    PT Stop Time 1033    PT Time Calculation (min) 46 min    Activity Tolerance Patient tolerated treatment well;No increased pain    Behavior During Therapy [facility] for tasks assessed/performed             Past Medical History:  Diagnosis Date   Depression    Medical history non-contributory    Preeclampsia, third trimester 02/05/2018   Past Surgical History:  Procedure Laterality Date   TONSILLECTOMY Bilateral    TONSILLECTOMY     WISDOM TOOTH EXTRACTION Bilateral    Patient Active Problem List   Diagnosis Date Noted   Chronic lower extremity pain (Right) 03/15/2024   Right lumbar radiculitis 03/09/2024   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) 03/09/2024   Transient weakness of right lower extremity 03/09/2024   Obesity, Class I, BMI 30-34.9 02/19/2024   Enthesopathy of knee region (Right) 01/27/2024   Acute knee pain (Right) 01/07/2024   Medial knee pain (Right) 01/07/2024   Postdural puncture headache 11/18/2023   Postpartum spinal headache 11/18/2023   Anxiety due to invasive procedure 11/18/2023   NSVD (normal spontaneous vaginal delivery) 11/13/2023   Gestational diabetes 11/11/2023   Postpartum hemorrhage 11/11/2023   Spotting affecting pregnancy 09/22/2023   Vaginal bleeding during pregnancy 09/22/2023   SS-A antibody positive 09/22/2023   Obesity affecting pregnancy 09/22/2023   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, antepartum 09/22/2023   Rash and nonspecific skin eruption 09/19/2023   Anxiety and depression 07/14/2023   History of pre-eclampsia in prior  pregnancy, currently pregnant 07/14/2023   Positive ANA (antinuclear antibody) 07/14/2023   Supervision of high risk pregnancy in second trimester 04/22/2023   Thrombocytopenia due to blood loss 02/07/2018   family H/O inherited metabolic disease 78/29/5621    PCP: Laurence Pons, NP  REFERRING PROVIDER: Renaldo Caroli, MD  REFERRING DIAG:  [phone] (ICD-10-CM) - Acute pain of right knee  M25.561 (ICD-10-CM) - Right medial knee pain  M76.891 (ICD-10-CM) - Enthesopathy of right knee region    RATIONALE FOR EVALUATION AND TREATMENT: Rehabilitation  THERAPY DIAG: Pain in right leg  Difficulty in walking, not elsewhere classified  ONSET DATE: 10/2023  FOLLOW-UP APPT SCHEDULED WITH REFERRING PROVIDER: Yes ; 03/09/24   PERTINENT HISTORY: Pt is a 28 year old female with primary c/o R knee/medial thigh/leg pain. Pt had injections on 02/19/24 in R knee that did not help. Patient reports negative experience with epidural, and she feels that her symptoms began after this. Patient reports burning "all of a sudden" pain that can go down to her foot. She states it is debilitating. Pt is taking Gabapentin for pain control. No significant integumentary change. Her child is 25 months old and she has 29-year-old. Pt reports doing well with childcare duties. Pt reports being unable to step on RLE when she is having episode of pain. Pt reports some nocturnal symptoms and even pain when at rest; sporadic onset of symptoms. Symptoms go away after 1-2 minutes. Pt is ANA positive; no active autoimmune disease.  PAIN:   Pain Intensity: Present: 0/10, Best: 0/10, Worst: 10/10 Pain location: anteromedial knee along medial peripatellar region  Pain quality: burning Radiating pain: Yes ; radiating from distomedial thigh down to dorsum of foot Swelling: No  Popping, catching, locking: No  Numbness/Tingling: No Focal weakness or buckling: No Aggravating factors: Hurts sporadically without aggravating  position/posture Relieving factors: Gabapentin, massage along distomedial thigh/VMO  24-hour pain behavior: None  History of prior back, hip, or knee injury, pain, surgery, or therapy: Yes; hx of back pain and chiropractic   Imaging: Yes ;  X-rays negative  Prior level of function: Independent 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: Relaxing with family   Living Environment Lives with: lives with their partner, 29 y/o daughter, 33-month-old boy Lives in: House/apartment  Patient Goals: Knee pain gone; able to get off of Gabapentin    OBJECTIVE:   Patient Surveys  LEFS 12/80 = 15%  Gross Musculoskeletal Assessment Tremor: None Bulk: Normal Tone: Normal Pt squats with weight on forefoot and is able to maintain SLR well with control of pronation of ankles    GAIT: Distance walked: 50 ft Assistive device utilized: None Level of assistance: Complete Independence Comments: L>R ankle overpronation  AROM  Thoracolumbar AROM Flexion 75% (no pain) Extension WNL (no pain) Lateral flexion: R 50%, L 100% Rotation: R 100%, L 75%  04/20/24 Flexion 75% (no pain) Extension WNL (no pain) Lateral flexion: R 50%, L 100% Rotation: R 100%, L 75%    AROM (Normal range in degrees) AROM 03/04/24  Hip Right Left  Flexion (125) WNL   Extension (15)    Abduction (40)    Adduction     Internal Rotation (45) WNL   External Rotation (45) WNL       Knee    Flexion (135) WNL   Extension (0) WNL       (* = pain; Blank rows = not tested)  LE MMT: MMT (out of 5) Right 03/04/24 Left 03/04/24  Hip flexion 4+ 4+  Hip extension 4 4  Hip abduction 4+ 5  Hip adduction    Hip internal rotation 5 5  Hip external rotation 4 4+  Knee flexion 5 5  Knee extension 5 5  Ankle dorsiflexion    Ankle plantarflexion    Ankle inversion    Ankle eversion    (* = pain; Blank rows = not tested)  Reflexes R/L Knee Jerk (L3/4): 2+/2+ Ankle Jerk (S1/2): 2+/2+ Clonus: Negative  bilat  Muscle Length Hamstrings: R: Positive L: Positive Quadriceps (Ely): R: Positive L: Positive  Palpation TTP pes anserinus 1, adductor longus 1 Graded on 0-4 scale (0 = no pain, 1 = pain, 2 = pain with wincing/grimacing/flinching, 3 = pain with withdrawal, 4 = unwilling to allow palpation)  VASCULAR Dorsalis pedis and posterior tibial pulses are palpable   SPECIAL TESTS  Lumbar Radiculopathy SLUMP: R Negative, L Negative SLR: R Negative, L Negative  Prone Knee Bend: R Positive, L Negative  Meniscus Tests McMurray's Test:  Medial Meniscus (Tibial ER): R: Negative L: Negative Effusion: R: Negative L: Negative   Patellofemoral Pain Syndrome Patellar Tilt (Lateral): R: Not examined L: Not examined Patellar Apprehension: R: Negative L: Negative Squatting pain: R: Negative L: Negative Resisted knee extension pain: R: Negative L: Negative Compression: R: Not examined L: Not examined Clarke's sign: R: Not examined L: Not examined Lateral Pull: R: Negative L: Negative     TODAY'S TREATMENT          04/20/2024  SUBJECTIVE STATEMENT:   Patient is still awaiting her MRI results. Pt reports maintaining progress with R thigh/knee pain. Patient reports notable back pain over last 2 weeks. Pt saw chiropractor today and had X-rays demonstrating mild curvature and "one hip being higher." Pt feels that she is maintaining progress - PT is helping. Pt feels that if she doesn't come, she will notice more pain.    Thoracolumbar AROM  Flexion 75% (no pain) Extension WNL (no pain) Lateral flexion: R 75%, L 100% Rotation: R 100%, L 100%  Repeated movement screen: no pain with repeated flexion/extension.   SLR: R Negative, L Negative   Manual Therapy - for symptom modulation, soft tissue sensitivity and mobility, joint mobility, ROM   STM  along R vastus medialis and adductor magnus/longus/gracilis x 8 minutes   Trigger Point Dry Needling   Subsequent Treatment: Instructions  provided previously at initial dry needling treatment.    Patient Verbal Consent Given: Yes Education Handout Provided: Yes Muscles Treated: R adductor magnus and gracilis with 0.30 x 60 mm single needle placements Electrical Stimulation Performed: No Treatment Response/Outcome: No notable post-treatment pain   Therapeutic Exercise - for improved soft tissue flexibility and extensibility as needed for ROM, neurodynamics to improve neural tension and reduce neuralgias/paresthesias  Seated elliptical; x 10 min, Level 7   -interval subjective gathered, 3 minutes not billed  *GOAL UPDATE PERFORMED   Supine hip abduction stretch, with blanket looped around foot; 3 x 30 sec  Bird dog; reviewed  Prone hip extension, reviewed  PATIENT EDUCATION: HEP updated/reviewed. Discussed continuing with neurodynamics and stretching/progressive strengthening as tolerated. Discussed tapering of PT visits and continuing with chiropractic follow-up as scheduled; pt continuing with advanced HEP.    *not today* Quadruped Academic librarian; 2 x 10, performed on each LE  Sidelying clamshell;  2 x 10 on each side, Green Tband  Glute bridge, with alternating leg extension; 3x5 alt R/L Dying bug, hooklying, LE hover to end of bed; 2x10 alternating upper/lower limbs  Prone Quadriceps stretch; 2 x 30 sec Quadruped donkey kick; 2 x 10 on either side  Prone femoral nerve flossing; 20x Prone femoral nerve tensioner; 20x Supine active hamstring stretch; 20x   PATIENT EDUCATION:  Education details: see above for patient education details Person educated: Patient Education method: Explanation, Demonstration, and Handouts Education comprehension: verbalized understanding and returned demonstration   HOME EXERCISE PROGRAM:  Access Code: [email or web address] URL: [email or web address] Date: 03/22/2024 Prepared by: Denese Finn  Exercises - Prone Femoral Nerve Mobilization  - 3 x daily - 7 x weekly - 2  sets - 10 reps - 1-2sec hold - Supine Hamstring Stretch  - 3 x daily - 7 x weekly - 2 sets - 10 reps - 1-2sec hold - Supine Butterfly Groin Stretch  - 3 x daily - 7 x weekly - 3 sets - 30sec hold - Quadruped Hip Abduction and External Rotation  - 1 x daily - 4-5 x weekly - 2 sets - 10 reps - 3sec hold - Supine Bridge  - 1 x daily - 4-5 x weekly - 2 sets - 10 reps - 3sec hold   ASSESSMENT:  CLINICAL IMPRESSION: Patient has fortunately met LEFS goal and has largely met other long-term pain goals. Results are affected by comorbid back pain experienced recently, which can be largely affected by sporadic and limited sleep schedule with baby at home and post-partum status. Pt reports she is largely maintaining well with regular physical therapy for RLE pain. Pt fortunately does  not have notable reproduction of symptoms with L-spine AROM, repeated motion, or SLR. She is undergoing chiropractic intervention for back pain, and this has helped this AM with back symptoms. Pt has made good progress to date, and she is appropriate for tapering of visits with gradual transition to home program pending ongoing progress and no regression in patient's condition. Pt will continue to benefit from skilled PT services to address deficits and improve function.    OBJECTIVE IMPAIRMENTS: difficulty walking, decreased ROM, decreased strength, impaired flexibility, and pain.   ACTIVITY LIMITATIONS: lifting, standing, stairs, transfers, and locomotion level  PARTICIPATION LIMITATIONS: cleaning, shopping, and community activity  PERSONAL FACTORS: Past/current experiences, Time since onset of injury/illness/exacerbation, and 1-2 comorbidities: (Depression, Positive ANA)  are also affecting patient's functional outcome.   REHAB POTENTIAL: Good  CLINICAL DECISION MAKING: Unstable/unpredictable  EVALUATION COMPLEXITY: High   GOALS: Goals reviewed with patient? Yes  SHORT TERM GOALS: Target date: 03/28/2024  Pt will  be independent with HEP to improve strength and decrease knee pain to improve pain-free function at home and work. Baseline: 03/04/24: Baseline HEP initiated.    04/20/24: Pt reports completing HEP most days.  Goal status: ACHIEVED   LONG TERM GOALS: Target date: 04/18/2024  Pt will have no nocturnal symptoms disturbing sleep as needed for improved sleep quality and long-term wellness Baseline: 03/04/24: Frequent symptoms which occur sporadically even when at rest.   04/20/24: Some back pain disturbing sleep, no leg pain during PM.  Goal status: MOSTLY MET   2.  Pt will decrease worst knee pain by at least 3 points on the NPRS in order to demonstrate clinically significant reduction in knee pain. Baseline: 03/04/24: 10/10 pain at worst.    04/20/24: Low back pain, no pain affecting knee/leg over previous week.  Goal status: MOSTLY MET   3.  Pt will decrease LEFS score by at least 9 points in order demonstrate clinically significant reduction in knee pain/disability.       Baseline: 03/04/24: 12/80 = 15%.   04/20/24: 61/80 = 76.25% Goal status: ACHIEVED    PLAN: PT FREQUENCY: 1x/week  PT DURATION: 3-4 weeks  PLANNED INTERVENTIONS: Therapeutic exercises, Therapeutic activity, Neuromuscular re-education, Balance training, Gait training, Patient/Family education, Self Care, Joint mobilization, Joint manipulation, Vestibular training, Canalith repositioning, Orthotic/Fit training, DME instructions, Dry Needling, Electrical stimulation, Spinal manipulation, Spinal mobilization, Cryotherapy, Moist heat, Taping, Traction, Ultrasound, Ionotophoresis 4mg /ml Dexamethasone, Manual therapy, and Re-evaluation.  PLAN FOR NEXT SESSION: Neurodynamics; soft tissue stretching for adductors/quads. Manual therapy/dry needling for desensitization of affected tissues. Continued hip and core strengthening with transition to HEP toward end of scheduled visits.    Denese Finn, PT, DPT #[id number]  Aleatha Hunting,  PT 04/20/2024, 10:42 AM

## 2024-04-22 ENCOUNTER — Ambulatory Visit: Payer: Medicaid Other | Admitting: Physical Therapy

## 2024-05-06 ENCOUNTER — Telehealth: Payer: Self-pay

## 2024-05-06 NOTE — Telephone Encounter (Signed)
 Returned patient phone call, keep scheduled appt for review of MRI and plan of care with FN.

## 2024-05-06 NOTE — Telephone Encounter (Signed)
 Telephone call from patient, stating that she received her MRI results on Mychart. Patient is wanting to know the next steps. Appointment made for 05/24/24 incase appointment needed.

## 2024-05-10 NOTE — Therapy (Deleted)
 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) 03/09/2024   Transient weakness of right lower extremity 03/09/2024   Obesity, Class I, BMI 30-34.9 02/19/2024   Enthesopathy of knee region (Right) 01/27/2024   Acute knee pain (Right) 01/07/2024   Medial knee pain (Right) 01/07/2024   Postdural puncture headache 11/18/2023   Postpartum spinal headache 11/18/2023   Anxiety due to invasive procedure 11/18/2023   NSVD (normal spontaneous vaginal delivery) 11/13/2023   Gestational diabetes 11/11/2023   Postpartum hemorrhage 11/11/2023   Spotting affecting pregnancy 09/22/2023   Vaginal bleeding during pregnancy 09/22/2023   SS-A antibody positive 09/22/2023   Obesity affecting pregnancy 09/22/2023   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, antepartum 09/22/2023   Rash and nonspecific skin eruption 09/19/2023   Anxiety and depression 07/14/2023   History of pre-eclampsia in prior pregnancy, currently pregnant 07/14/2023   Positive ANA (antinuclear antibody) 07/14/2023   Supervision of high risk pregnancy in second trimester 04/22/2023   Thrombocytopenia due to blood loss 02/07/2018   family H/O inherited metabolic disease 78/46/9629    PCP: Laurence Pons, NP  REFERRING PROVIDER: Renaldo Caroli, MD  REFERRING DIAG:  [phone] (ICD-10-CM) - Acute pain of right knee  M25.561  (ICD-10-CM) - Right medial knee pain  M76.891 (ICD-10-CM) - Enthesopathy of right knee region    RATIONALE FOR EVALUATION AND TREATMENT: Rehabilitation  THERAPY DIAG: Pain in right leg  Difficulty in walking, not elsewhere classified  ONSET DATE: 10/2023  FOLLOW-UP APPT SCHEDULED WITH REFERRING PROVIDER: Yes ; 03/09/24   PERTINENT HISTORY: Pt is a 28 year old female with primary c/o R knee/medial thigh/leg pain. Pt had injections on 02/19/24 in R knee that did not help. Patient reports negative experience with epidural, and she feels that her symptoms began after this. Patient reports burning "all of a sudden" pain that can go down to her foot. She states it is debilitating. Pt is taking Gabapentin for pain control. No significant integumentary change. Her child is 30 months old and she has 33-year-old. Pt reports doing well with childcare duties. Pt reports being unable to step on RLE when she is having episode of pain. Pt reports some nocturnal symptoms and even pain when at rest; sporadic onset of symptoms. Symptoms go away after 1-2 minutes. Pt is ANA positive; no active autoimmune disease.   PAIN:   Pain Intensity: Present: 0/10, Best: 0/10, Worst: 10/10 Pain location: anteromedial knee along medial peripatellar region  Pain quality: burning Radiating pain: Yes ; radiating from distomedial thigh down to dorsum of foot Swelling: No  Popping, catching, locking: No  Numbness/Tingling: No Focal weakness or buckling: No Aggravating factors: Hurts sporadically without aggravating position/posture Relieving factors: Gabapentin, massage along distomedial thigh/VMO  24-hour pain behavior: None  History of prior back, hip, or knee injury, pain, surgery, or therapy: Yes; hx of back pain and chiropractic   Imaging: Yes ;  X-rays negative  Prior level of function: Independent 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: Relaxing with family   Living Environment Lives with: lives with  their partner, 33 y/o daughter, 31-month-old boy Lives in: House/apartment  Patient Goals: Knee pain gone; able to get off of Gabapentin    OBJECTIVE:   Patient Surveys  LEFS 12/80 = 15%  Gross Musculoskeletal Assessment Tremor: None Bulk: Normal Tone: Normal Pt squats with weight on forefoot and is able to maintain SLR well with control of pronation of ankles    GAIT: Distance walked: 50 ft Assistive device utilized: None Level of assistance: Complete Independence Comments: L>R ankle overpronation  AROM  Thoracolumbar AROM Flexion 75% (no pain) Extension WNL (no pain) Lateral flexion: R 50%, L 100% Rotation: R 100%, L 75%  04/20/24 Flexion 75% (no pain) Extension WNL (no pain) Lateral flexion: R 50%, L 100% Rotation: R 100%, L 75%    AROM (Normal range in degrees) AROM 03/04/24  Hip Right Left  Flexion (125) WNL   Extension (15)    Abduction (40)    Adduction     Internal Rotation (45) WNL   External Rotation (45) WNL       Knee    Flexion (135) WNL   Extension (0) WNL       (* = pain; Blank rows = not tested)  LE MMT: MMT (out of 5) Right 03/04/24 Left 03/04/24  Hip flexion 4+ 4+  Hip extension 4 4  Hip abduction 4+ 5  Hip adduction    Hip internal rotation 5 5  Hip external rotation 4 4+  Knee flexion 5 5  Knee extension 5 5  Ankle dorsiflexion    Ankle plantarflexion    Ankle inversion    Ankle eversion    (* = pain; Blank rows = not tested)  Reflexes R/L Knee Jerk (L3/4): 2+/2+ Ankle Jerk (S1/2): 2+/2+ Clonus: Negative bilat  Muscle Length Hamstrings: R: Positive L: Positive Quadriceps ([place]): R: Positive L: Positive  Palpation TTP pes anserinus 1, adductor longus 1 Graded on 0-4 scale (0 = no pain, 1 = pain, 2 = pain with wincing/grimacing/flinching, 3 = pain with withdrawal, 4 = unwilling to allow palpation)  VASCULAR Dorsalis pedis and posterior tibial pulses are palpable   SPECIAL TESTS  Lumbar Radiculopathy SLUMP: R  Negative, L Negative SLR: R Negative, L Negative  Prone Knee Bend: R Positive, L Negative  Meniscus Tests McMurray's Test:  Medial Meniscus (Tibial ER): R: Negative L: Negative Effusion: R: Negative L: Negative   Patellofemoral Pain Syndrome Patellar Tilt (Lateral): R: Not examined L: Not examined Patellar Apprehension: R: Negative L: Negative Squatting pain: R: Negative L: Negative Resisted knee extension pain: R: Negative L: Negative Compression: R: Not examined L: Not examined Clarke's sign: R: Not examined L: Not examined Lateral Pull: R: Negative L: Negative     TODAY'S TREATMENT          05/10/2024   SUBJECTIVE STATEMENT:   Patient is still awaiting her MRI results. Pt reports maintaining progress with R thigh/knee pain. Patient reports notable back pain over last 2 weeks. Pt saw chiropractor today and had X-rays demonstrating mild curvature and "one hip being higher." Pt feels that she is maintaining progress - PT is helping. Pt feels that if she doesn't come, she will notice more pain.    Thoracolumbar AROM  Flexion 75% (no pain) Extension WNL (no pain) Lateral flexion: R 75%, L 100% Rotation: R 100%, L 100%  Repeated movement screen: no pain with  repeated flexion/extension.   SLR: R Negative, L Negative   Manual Therapy - for symptom modulation, soft tissue sensitivity and mobility, joint mobility, ROM   STM  along R vastus medialis and adductor magnus/longus/gracilis x 8 minutes   Trigger Point Dry Needling   Subsequent Treatment: Instructions provided previously at initial dry needling treatment.    Patient Verbal Consent Given: Yes Education Handout Provided: Yes Muscles Treated: R adductor magnus and gracilis with 0.30 x 60 mm single needle placements Electrical Stimulation Performed: No Treatment Response/Outcome: No notable post-treatment pain   Therapeutic Exercise - for improved soft tissue flexibility and extensibility as needed for ROM,  neurodynamics to improve neural tension and reduce neuralgias/paresthesias  Seated elliptical; x 10 min, Level 7   -interval subjective gathered, 3 minutes not billed  *GOAL UPDATE PERFORMED   Supine hip abduction stretch, with blanket looped around foot; 3 x 30 sec  Bird dog; reviewed  Prone hip extension, reviewed  PATIENT EDUCATION: HEP updated/reviewed. Discussed continuing with neurodynamics and stretching/progressive strengthening as tolerated. Discussed tapering of PT visits and continuing with chiropractic follow-up as scheduled; pt continuing with advanced HEP.    *not today* Quadruped Academic librarian; 2 x 10, performed on each LE  Sidelying clamshell;  2 x 10 on each side, Green Tband  Glute bridge, with alternating leg extension; 3x5 alt R/L Dying bug, hooklying, LE hover to end of bed; 2x10 alternating upper/lower limbs  Prone Quadriceps stretch; 2 x 30 sec Quadruped donkey kick; 2 x 10 on either side  Prone femoral nerve flossing; 20x Prone femoral nerve tensioner; 20x Supine active hamstring stretch; 20x   PATIENT EDUCATION:  Education details: see above for patient education details Person educated: Patient Education method: Explanation, Demonstration, and Handouts Education comprehension: verbalized understanding and returned demonstration   HOME EXERCISE PROGRAM:  Access Code: [email or web address] URL: [email or web address] Date: 03/22/2024 Prepared by: Denese Finn  Exercises - Prone Femoral Nerve Mobilization  - 3 x daily - 7 x weekly - 2 sets - 10 reps - 1-2sec hold - Supine Hamstring Stretch  - 3 x daily - 7 x weekly - 2 sets - 10 reps - 1-2sec hold - Supine Butterfly Groin Stretch  - 3 x daily - 7 x weekly - 3 sets - 30sec hold - Quadruped Hip Abduction and External Rotation  - 1 x daily - 4-5 x weekly - 2 sets - 10 reps - 3sec hold - Supine Bridge  - 1 x daily - 4-5 x weekly - 2 sets - 10 reps - 3sec hold   ASSESSMENT:  CLINICAL  IMPRESSION: Patient has fortunately met LEFS goal and has largely met other long-term pain goals. Results are affected by comorbid back pain experienced recently, which can be largely affected by sporadic and limited sleep schedule with baby at home and post-partum status. Pt reports she is largely maintaining well with regular physical therapy for RLE pain. Pt fortunately does not have notable reproduction of symptoms with L-spine AROM, repeated motion, or SLR. She is undergoing chiropractic intervention for back pain, and this has helped this AM with back symptoms. Pt has made good progress to date, and she is appropriate for tapering of visits with gradual transition to home program pending ongoing progress and no regression in patient's condition. Pt will continue to benefit from skilled PT services to address deficits and improve function.    OBJECTIVE IMPAIRMENTS: difficulty walking, decreased ROM, decreased strength, impaired flexibility, and pain.   ACTIVITY LIMITATIONS: lifting, standing, stairs, transfers,  and locomotion level  PARTICIPATION LIMITATIONS: cleaning, shopping, and community activity  PERSONAL FACTORS: Past/current experiences, Time since onset of injury/illness/exacerbation, and 1-2 comorbidities: (Depression, Positive ANA) are also affecting patient's functional outcome.   REHAB POTENTIAL: Good  CLINICAL DECISION MAKING: Unstable/unpredictable  EVALUATION COMPLEXITY: High   GOALS: Goals reviewed with patient? Yes  SHORT TERM GOALS: Target date: 03/28/2024  Pt will be independent with HEP to improve strength and decrease knee pain to improve pain-free function at home and work. Baseline: 03/04/24: Baseline HEP initiated.    04/20/24: Pt reports completing HEP most days.  Goal status: ACHIEVED   LONG TERM GOALS: Target date: 04/18/2024  Pt will have no nocturnal symptoms disturbing sleep as needed for improved sleep quality and long-term wellness Baseline: 03/04/24:  Frequent symptoms which occur sporadically even when at rest.   04/20/24: Some back pain disturbing sleep, no leg pain during PM.  Goal status: MOSTLY MET   2.  Pt will decrease worst knee pain by at least 3 points on the NPRS in order to demonstrate clinically significant reduction in knee pain. Baseline: 03/04/24: 10/10 pain at worst.    04/20/24: Low back pain, no pain affecting knee/leg over previous week.  Goal status: MOSTLY MET   3.  Pt will decrease LEFS score by at least 9 points in order demonstrate clinically significant reduction in knee pain/disability.       Baseline: 03/04/24: 12/80 = 15%.   04/20/24: 61/80 = 76.25% Goal status: ACHIEVED    PLAN: PT FREQUENCY: 1x/week  PT DURATION: 3-4 weeks  PLANNED INTERVENTIONS: Therapeutic exercises, Therapeutic activity, Neuromuscular re-education, Balance training, Gait training, Patient/Family education, Self Care, Joint mobilization, Joint manipulation, Vestibular training, Canalith repositioning, Orthotic/Fit training, DME instructions, Dry Needling, Electrical stimulation, Spinal manipulation, Spinal mobilization, Cryotherapy, Moist heat, Taping, Traction, Ultrasound, Ionotophoresis 4mg /ml Dexamethasone, Manual therapy, and Re-evaluation.  PLAN FOR NEXT SESSION: Neurodynamics; soft tissue stretching for adductors/quads. Manual therapy/dry needling for desensitization of affected tissues. Continued hip and core strengthening with transition to HEP toward end of scheduled visits.    Denese Finn, PT, DPT #[id number]  Aleatha Hunting, PT 05/10/2024, 12:20 PM

## 2024-05-11 ENCOUNTER — Ambulatory Visit: Admitting: Physical Therapy

## 2024-05-18 ENCOUNTER — Encounter: Payer: Self-pay | Admitting: Physical Therapy

## 2024-05-18 ENCOUNTER — Ambulatory Visit: Attending: Pain Medicine | Admitting: Physical Therapy

## 2024-05-18 DIAGNOSIS — M79604 Pain in right leg: Secondary | ICD-10-CM

## 2024-05-18 DIAGNOSIS — R262 Difficulty in walking, not elsewhere classified: Secondary | ICD-10-CM

## 2024-05-18 NOTE — Therapy (Signed)
 OUTPATIENT PHYSICAL THERAPY TREATMENT   Patient Name: Rachel Ortega MRN: [id number] DOB:06/17/1996, 28 y.o., female Today's Date: 05/18/2024  END OF SESSION:  PT End of Session - 05/18/24 0946     Visit Number 11    Number of Visits 13    Date for PT Re-Evaluation 04/15/24    PT Start Time 0948    PT Stop Time 1029    PT Time Calculation (min) 41 min    Activity Tolerance Patient tolerated treatment well;No increased pain    Behavior During Therapy [facility] for tasks assessed/performed              Past Medical History:  Diagnosis Date   Depression    Medical history non-contributory    Preeclampsia, third trimester 02/05/2018   Past Surgical History:  Procedure Laterality Date   TONSILLECTOMY Bilateral    TONSILLECTOMY     WISDOM TOOTH EXTRACTION Bilateral    Patient Active Problem List   Diagnosis Date Noted   Chronic lower extremity pain (Right) 03/15/2024   Right lumbar radiculitis 03/09/2024   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) 03/09/2024   Transient weakness of right lower extremity 03/09/2024   Obesity, Class I, BMI 30-34.9 02/19/2024   Enthesopathy of knee region (Right) 01/27/2024   Acute knee pain (Right) 01/07/2024   Medial knee pain (Right) 01/07/2024   Postdural puncture headache 11/18/2023   Postpartum spinal headache 11/18/2023   Anxiety due to invasive procedure 11/18/2023   NSVD (normal spontaneous vaginal delivery) 11/13/2023   Gestational diabetes 11/11/2023   Postpartum hemorrhage 11/11/2023   Spotting affecting pregnancy 09/22/2023   Vaginal bleeding during pregnancy 09/22/2023   SS-A antibody positive 09/22/2023   Obesity affecting pregnancy 09/22/2023   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, antepartum 09/22/2023   Rash and nonspecific skin eruption 09/19/2023   Anxiety and depression 07/14/2023   History of pre-eclampsia in prior pregnancy, currently pregnant 07/14/2023   Positive ANA (antinuclear  antibody) 07/14/2023   Supervision of high risk pregnancy in second trimester 04/22/2023   Thrombocytopenia due to blood loss 02/07/2018   family H/O inherited metabolic disease 40/98/1191    PCP: Laurence Pons, NP  REFERRING PROVIDER: Laurence Pons, NP  REFERRING DIAG:  [phone] (ICD-10-CM) - Acute pain of right knee  M25.561 (ICD-10-CM) - Right medial knee pain  M76.891 (ICD-10-CM) - Enthesopathy of right knee region    RATIONALE FOR EVALUATION AND TREATMENT: Rehabilitation  THERAPY DIAG: Pain in right leg  Difficulty in walking, not elsewhere classified  ONSET DATE: 10/2023  FOLLOW-UP APPT SCHEDULED WITH REFERRING PROVIDER: Yes ; 03/09/24   PERTINENT HISTORY: Pt is a 28 year old female with primary c/o R knee/medial thigh/leg pain. Pt had injections on 02/19/24 in R knee that did not help. Patient reports negative experience with epidural, and she feels that her symptoms began after this. Patient reports burning "all of a sudden" pain that can go down to her foot. She states it is debilitating. Pt is taking Gabapentin for pain control. No significant integumentary change. Her child is 59 months old and she has 27-year-old. Pt reports doing well with childcare duties. Pt reports being unable to step on RLE when she is having episode of pain. Pt reports some nocturnal symptoms and even pain when at rest; sporadic onset of symptoms. Symptoms go away after 1-2 minutes. Pt is ANA positive; no active autoimmune disease.   PAIN:   Pain Intensity: Present: 0/10, Best: 0/10, Worst: 10/10 Pain location:  anteromedial knee along medial peripatellar region  Pain quality: burning Radiating pain: Yes ; radiating from distomedial thigh down to dorsum of foot Swelling: No  Popping, catching, locking: No  Numbness/Tingling: No Focal weakness or buckling: No Aggravating factors: Hurts sporadically without aggravating position/posture Relieving factors: Gabapentin, massage along distomedial  thigh/VMO  24-hour pain behavior: None  History of prior back, hip, or knee injury, pain, surgery, or therapy: Yes; hx of back pain and chiropractic   Imaging: Yes ;  X-rays negative  Prior level of function: Independent 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: Relaxing with family   Living Environment Lives with: lives with their partner, 61 y/o daughter, 49-month-old boy Lives in: House/apartment  Patient Goals: Knee pain gone; able to get off of Gabapentin    OBJECTIVE:   Patient Surveys  LEFS 12/80 = 15%  Gross Musculoskeletal Assessment Tremor: None Bulk: Normal Tone: Normal Pt squats with weight on forefoot and is able to maintain SLR well with control of pronation of ankles    GAIT: Distance walked: 50 ft Assistive device utilized: None Level of assistance: Complete Independence Comments: L>R ankle overpronation  AROM  Thoracolumbar AROM Flexion 75% (no pain) Extension WNL (no pain) Lateral flexion: R 50%, L 100% Rotation: R 100%, L 75%  04/20/24 Flexion 75% (no pain) Extension WNL (no pain) Lateral flexion: R 50%, L 100% Rotation: R 100%, L 75%    AROM (Normal range in degrees) AROM 03/04/24  Hip Right Left  Flexion (125) WNL   Extension (15)    Abduction (40)    Adduction     Internal Rotation (45) WNL   External Rotation (45) WNL       Knee    Flexion (135) WNL   Extension (0) WNL       (* = pain; Blank rows = not tested)  LE MMT: MMT (out of 5) Right 03/04/24 Left 03/04/24  Hip flexion 4+ 4+  Hip extension 4 4  Hip abduction 4+ 5  Hip adduction    Hip internal rotation 5 5  Hip external rotation 4 4+  Knee flexion 5 5  Knee extension 5 5  Ankle dorsiflexion    Ankle plantarflexion    Ankle inversion    Ankle eversion    (* = pain; Blank rows = not tested)  Reflexes R/L Knee Jerk (L3/4): 2+/2+ Ankle Jerk (S1/2): 2+/2+ Clonus: Negative bilat  Muscle Length Hamstrings: R: Positive L: Positive Quadriceps (E[place]):  R: Positive L: Positive  Palpation TTP pes anserinus 1, adductor longus 1 Graded on 0-4 scale (0 = no pain, 1 = pain, 2 = pain with wincing/grimacing/flinching, 3 = pain with withdrawal, 4 = unwilling to allow palpation)  VASCULAR Dorsalis pedis and posterior tibial pulses are palpable   SPECIAL TESTS  Lumbar Radiculopathy SLUMP: R Negative, L Negative SLR: R Negative, L Negative  Prone Knee Bend: R Positive, L Negative  Meniscus Tests McMurray's Test:  Medial Meniscus (Tibial ER): R: Negative L: Negative Effusion: R: Negative L: Negative   Patellofemoral Pain Syndrome Patellar Tilt (Lateral): R: Not examined L: Not examined Patellar Apprehension: R: Negative L: Negative Squatting pain: R: Negative L: Negative Resisted knee extension pain: R: Negative L: Negative Compression: R: Not examined L: Not examined Clarke's sign: R: Not examined L: Not examined Lateral Pull: R: Negative L: Negative     TODAY'S TREATMENT          05/18/2024   SUBJECTIVE STATEMENT:   Patient reports that her pain is relatively controlled  well with use of Gabapentin and PT. She reports going to chiropractor, and that is helping with LBP. She reports upper back pain first thing in AM. She reports some paracervical/UT pain this AM. No back or leg pain this AM.    Thoracolumbar AROM  Flexion 75% (no pain) Extension WNL (no pain) Lateral flexion: R 75%, L 100% Rotation: R 100%, L 100%   Manual Therapy - for symptom modulation, soft tissue sensitivity and mobility, joint mobility, ROM   STM and IASTM along R vastus medialis and adductor magnus/longus/gracilis x 8 minutes Manual hip adductor stretch; 2 x 30 sec   Therapeutic Exercise - for improved soft tissue flexibility and extensibility as needed for ROM, neurodynamics to improve neural tension and reduce neuralgias/paresthesias  Seated elliptical; x 6 min, Level 6.5  -interval subjective gathered, 3 minutes not billed  Physioball bridge,  Silver physioball; 2 x 10   Dying bug, alternating UE/LE with LE hover end of bed; 2 x 10 alt  Sidelying hip abduction; 4-lb ankle weights; 2 x 10, bilat   PATIENT EDUCATION: HEP updated/reviewed. Discussed re-assessment for next appointment and consideration of discharge versus further tapering of PT visits.    *not today* Supine hip abduction stretch, with blanket looped around foot; 3 x 30 sec Bird dog; reviewed Prone hip extension, reviewed Quadruped Academic librarian; 2 x 10, performed on each LE  Sidelying clamshell;  2 x 10 on each side, Green Tband  Glute bridge, with alternating leg extension; 3x5 alt R/L Prone Quadriceps stretch; 2 x 30 sec Quadruped donkey kick; 2 x 10 on either side  Prone femoral nerve flossing; 20x Prone femoral nerve tensioner; 20x Supine active hamstring stretch; 20x   PATIENT EDUCATION:  Education details: see above for patient education details Person educated: Patient Education method: Explanation, Demonstration, and Handouts Education comprehension: verbalized understanding and returned demonstration   HOME EXERCISE PROGRAM:  Access Code: [email or web address] URL: [email or web address] Date: 05/18/2024 Prepared by: Denese Finn  Exercises - Prone Femoral Nerve Mobilization  - 3 x daily - 7 x weekly - 2 sets - 10 reps - 1-2sec hold - Supine Hamstring Stretch  - 3 x daily - 7 x weekly - 2 sets - 10 reps - 1-2sec hold - Supine Butterfly Groin Stretch  - 3 x daily - 7 x weekly - 3 sets - 30sec hold - Supine Bridge  - 1 x daily - 4-5 x weekly - 2 sets - 10 reps - 3sec hold - Bird Dog  - 1 x daily - 4-5 x weekly - 2 sets - 10 reps - Prone Hip Extension  - 1 x daily - 4-5 x weekly - 2 sets - 10 reps - Supine Dead Bug with Leg Extension  - 1 x daily - 4 x weekly - 2 sets - 10 reps   ASSESSMENT:  CLINICAL IMPRESSION: Patient has comorbid cervicothoracic pain for which she is undergoing chiropractic treatment. Pt reports no recent  significant re-occurrence of RLE pain, but she is also still taking Gabapentin 2x/day. Today, we discussed following up with physician regarding how to taper from medication versus immediately stopping. Pt tolerates progression of core and hip strengthening well today without notable pain. Pt will continue to benefit from skilled PT services to address deficits and improve function.    OBJECTIVE IMPAIRMENTS: difficulty walking, decreased ROM, decreased strength, impaired flexibility, and pain.   ACTIVITY LIMITATIONS: lifting, standing, stairs, transfers, and locomotion level  PARTICIPATION LIMITATIONS: cleaning, shopping, and community activity  PERSONAL  FACTORS: Past/current experiences, Time since onset of injury/illness/exacerbation, and 1-2 comorbidities: (Depression, Positive ANA) are also affecting patient's functional outcome.   REHAB POTENTIAL: Good  CLINICAL DECISION MAKING: Unstable/unpredictable  EVALUATION COMPLEXITY: High   GOALS: Goals reviewed with patient? Yes  SHORT TERM GOALS: Target date: 03/28/2024  Pt will be independent with HEP to improve strength and decrease knee pain to improve pain-free function at home and work. Baseline: 03/04/24: Baseline HEP initiated.    04/20/24: Pt reports completing HEP most days.  Goal status: ACHIEVED   LONG TERM GOALS: Target date: 04/18/2024  Pt will have no nocturnal symptoms disturbing sleep as needed for improved sleep quality and long-term wellness Baseline: 03/04/24: Frequent symptoms which occur sporadically even when at rest.   04/20/24: Some back pain disturbing sleep, no leg pain during PM.  Goal status: MOSTLY MET   2.  Pt will decrease worst knee pain by at least 3 points on the NPRS in order to demonstrate clinically significant reduction in knee pain. Baseline: 03/04/24: 10/10 pain at worst.    04/20/24: Low back pain, no pain affecting knee/leg over previous week.  Goal status: MOSTLY MET   3.  Pt will decrease LEFS  score by at least 9 points in order demonstrate clinically significant reduction in knee pain/disability.       Baseline: 03/04/24: 12/80 = 15%.   04/20/24: 61/80 = 76.25% Goal status: ACHIEVED    PLAN: PT FREQUENCY: 1x/week  PT DURATION: 3-4 weeks  PLANNED INTERVENTIONS: Therapeutic exercises, Therapeutic activity, Neuromuscular re-education, Balance training, Gait training, Patient/Family education, Self Care, Joint mobilization, Joint manipulation, Vestibular training, Canalith repositioning, Orthotic/Fit training, DME instructions, Dry Needling, Electrical stimulation, Spinal manipulation, Spinal mobilization, Cryotherapy, Moist heat, Taping, Traction, Ultrasound, Ionotophoresis 4mg /ml Dexamethasone, Manual therapy, and Re-evaluation.  PLAN FOR NEXT SESSION: Neurodynamics; soft tissue stretching for adductors/quads. Manual therapy/dry needling for desensitization of affected tissues. Continued hip and core strengthening with transition to HEP toward end of scheduled visits.    Denese Finn, PT, DPT #[id number]  Aleatha Hunting, PT 05/18/2024, 12:58 PM

## 2024-05-24 ENCOUNTER — Ambulatory Visit: Admitting: Pain Medicine

## 2024-05-25 ENCOUNTER — Ambulatory Visit: Admitting: Physical Therapy

## 2024-05-25 NOTE — Therapy (Deleted)
 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) 03/09/2024   Transient weakness of right lower extremity 03/09/2024   Obesity, Class I, BMI 30-34.9 02/19/2024   Enthesopathy of knee region (Right) 01/27/2024   Acute knee pain (Right) 01/07/2024   Medial knee pain (Right) 01/07/2024   Postdural puncture headache 11/18/2023   Postpartum spinal headache 11/18/2023   Anxiety due to invasive procedure 11/18/2023   NSVD (normal spontaneous vaginal delivery) 11/13/2023   Gestational diabetes 11/11/2023   Postpartum hemorrhage 11/11/2023   Spotting affecting pregnancy 09/22/2023   Vaginal bleeding during pregnancy 09/22/2023   SS-A antibody positive 09/22/2023   Obesity affecting pregnancy 09/22/2023   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, antepartum 09/22/2023   Rash and nonspecific skin eruption 09/19/2023   Anxiety and depression 07/14/2023   History of pre-eclampsia in prior pregnancy, currently pregnant 07/14/2023   Positive ANA (antinuclear antibody) 07/14/2023   Supervision of high risk pregnancy in second trimester 04/22/2023   Thrombocytopenia due to blood loss 02/07/2018   family H/O inherited metabolic disease 56/21/3086    PCP: Laurence Pons, NP  REFERRING PROVIDER: Renaldo Caroli, MD  REFERRING DIAG:  [phone] (ICD-10-CM) - Acute pain of right knee  M25.561  (ICD-10-CM) - Right medial knee pain  M76.891 (ICD-10-CM) - Enthesopathy of right knee region    RATIONALE FOR EVALUATION AND TREATMENT: Rehabilitation  THERAPY DIAG: Pain in right leg  Difficulty in walking, not elsewhere classified  ONSET DATE: 10/2023  FOLLOW-UP APPT SCHEDULED WITH REFERRING PROVIDER: Yes ; 03/09/24   PERTINENT HISTORY: Pt is a 28 year old female with primary c/o R knee/medial thigh/leg pain. Pt had injections on 02/19/24 in R knee that did not help. Patient reports negative experience with epidural, and she feels that her symptoms began after this. Patient reports burning "all of a sudden" pain that can go down to her foot. She states it is debilitating. Pt is taking Gabapentin for pain control. No significant integumentary change. Her child is 15 months old and she has 75-year-old. Pt reports doing well with childcare duties. Pt reports being unable to step on RLE when she is having episode of pain. Pt reports some nocturnal symptoms and even pain when at rest; sporadic onset of symptoms. Symptoms go away after 1-2 minutes. Pt is ANA positive; no active autoimmune disease.   PAIN:   Pain Intensity: Present: 0/10, Best: 0/10, Worst: 10/10 Pain location: anteromedial knee along medial peripatellar region  Pain quality: burning Radiating pain: Yes ; radiating from distomedial thigh down to dorsum of foot Swelling: No  Popping, catching, locking: No  Numbness/Tingling: No Focal weakness or buckling: No Aggravating factors: Hurts sporadically without aggravating position/posture Relieving factors: Gabapentin, massage along distomedial thigh/VMO  24-hour pain behavior: None  History of prior back, hip, or knee injury, pain, surgery, or therapy: Yes; hx of back pain and chiropractic   Imaging: Yes ;  X-rays  negative  Prior level of function: Independent 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: Relaxing with family   Living Environment Lives with: lives with  their partner, 68 y/o daughter, 38-month-old boy Lives in: House/apartment  Patient Goals: Knee pain gone; able to get off of Gabapentin    OBJECTIVE:   Patient Surveys  LEFS 12/80 = 15%  Gross Musculoskeletal Assessment Tremor: None Bulk: Normal Tone: Normal Pt squats with weight on forefoot and is able to maintain SLR well with control of pronation of ankles    GAIT: Distance walked: 50 ft Assistive device utilized: None Level of assistance: Complete Independence Comments: L>R ankle overpronation  AROM  Thoracolumbar AROM Flexion 75% (no pain) Extension WNL (no pain) Lateral flexion: R 50%, L 100% Rotation: R 100%, L 75%  04/20/24 Flexion 75% (no pain) Extension WNL (no pain) Lateral flexion: R 50%, L 100% Rotation: R 100%, L 75%    AROM (Normal range in degrees) AROM 03/04/24  Hip Right Left  Flexion (125) WNL   Extension (15)    Abduction (40)    Adduction     Internal Rotation (45) WNL   External Rotation (45) WNL       Knee    Flexion (135) WNL   Extension (0) WNL       (* = pain; Blank rows = not tested)  LE MMT: MMT (out of 5) Right 03/04/24 Left 03/04/24  Hip flexion 4+ 4+  Hip extension 4 4  Hip abduction 4+ 5  Hip adduction    Hip internal rotation 5 5  Hip external rotation 4 4+  Knee flexion 5 5  Knee extension 5 5  Ankle dorsiflexion    Ankle plantarflexion    Ankle inversion    Ankle eversion    (* = pain; Blank rows = not tested)  Reflexes R/L Knee Jerk (L3/4): 2+/2+ Ankle Jerk (S1/2): 2+/2+ Clonus: Negative bilat  Muscle Length Hamstrings: R: Positive L: Positive Quadriceps (Ely): R: Positive L: Positive  Palpation TTP pes anserinus 1, adductor longus 1 Graded on 0-4 scale (0 = no pain, 1 = pain, 2 = pain with wincing/grimacing/flinching, 3 = pain with withdrawal, 4 = unwilling to allow palpation)  VASCULAR Dorsalis pedis and posterior tibial pulses are palpable   SPECIAL TESTS  Lumbar Radiculopathy SLUMP: R  Negative, L Negative SLR: R Negative, L Negative  Prone Knee Bend: R Positive, L Negative  Meniscus Tests McMurray's Test:  Medial Meniscus (Tibial ER): R: Negative L: Negative Effusion: R: Negative L: Negative   Patellofemoral Pain Syndrome Patellar Tilt (Lateral): R: Not examined L: Not examined Patellar Apprehension: R: Negative L: Negative Squatting pain: R: Negative L: Negative Resisted knee extension pain: R: Negative L: Negative Compression: R: Not examined L: Not examined Clarke's sign: R: Not examined L: Not examined Lateral Pull: R: Negative L: Negative     TODAY'S TREATMENT          05/25/2024   SUBJECTIVE STATEMENT:   Patient reports that her pain is relatively controlled well with use of Gabapentin and PT. She reports going to chiropractor, and that is helping with LBP. She reports upper back pain first thing in AM. She reports some paracervical/UT pain this AM. No back or leg pain this AM.    Thoracolumbar AROM  Flexion 75% (no pain) Extension WNL (no pain) Lateral flexion: R 75%, L 100% Rotation: R 100%, L 100%   Manual Therapy - for symptom modulation, soft tissue sensitivity and mobility, joint mobility, ROM  STM and IASTM along R vastus medialis and adductor magnus/longus/gracilis x 8 minutes Manual hip adductor stretch; 2 x 30 sec   Therapeutic Exercise - for improved soft tissue flexibility and extensibility as needed for ROM, neurodynamics to improve neural tension and reduce neuralgias/paresthesias  Seated elliptical; x 6 min, Level 6.5  -interval subjective gathered, 3 minutes not billed  Physioball bridge, Silver physioball; 2 x 10   Dying bug, alternating UE/LE with LE hover end of bed; 2 x 10 alt  Sidelying hip abduction; 4-lb ankle weights; 2 x 10, bilat   PATIENT EDUCATION: HEP updated/reviewed. Discussed re-assessment for next appointment and consideration of discharge versus further tapering of PT visits.    *not today* Supine hip  abduction stretch, with blanket looped around foot; 3 x 30 sec Bird dog; reviewed Prone hip extension, reviewed Quadruped Academic librarian; 2 x 10, performed on each LE  Sidelying clamshell;  2 x 10 on each side, Green Tband  Glute bridge, with alternating leg extension; 3x5 alt R/L Prone Quadriceps stretch; 2 x 30 sec Quadruped donkey kick; 2 x 10 on either side  Prone femoral nerve flossing; 20x Prone femoral nerve tensioner; 20x Supine active hamstring stretch; 20x   PATIENT EDUCATION:  Education details: see above for patient education details Person educated: Patient Education method: Explanation, Demonstration, and Handouts Education comprehension: verbalized understanding and returned demonstration   HOME EXERCISE PROGRAM:  Access Code: [email or web address] URL: [email or web address] Date: 05/18/2024 Prepared by: Denese Finn  Exercises - Prone Femoral Nerve Mobilization  - 3 x daily - 7 x weekly - 2 sets - 10 reps - 1-2sec hold - Supine Hamstring Stretch  - 3 x daily - 7 x weekly - 2 sets - 10 reps - 1-2sec hold - Supine Butterfly Groin Stretch  - 3 x daily - 7 x weekly - 3 sets - 30sec hold - Supine Bridge  - 1 x daily - 4-5 x weekly - 2 sets - 10 reps - 3sec hold - Bird Dog  - 1 x daily - 4-5 x weekly - 2 sets - 10 reps - Prone Hip Extension  - 1 x daily - 4-5 x weekly - 2 sets - 10 reps - Supine Dead Bug with Leg Extension  - 1 x daily - 4 x weekly - 2 sets - 10 reps   ASSESSMENT:  CLINICAL IMPRESSION: Patient has comorbid cervicothoracic pain for which she is undergoing chiropractic treatment. Pt reports no recent significant re-occurrence of RLE pain, but she is also still taking Gabapentin 2x/day. Today, we discussed following up with physician regarding how to taper from medication versus immediately stopping. Pt tolerates progression of core and hip strengthening well today without notable pain. Pt will continue to benefit from skilled PT services to address  deficits and improve function.    OBJECTIVE IMPAIRMENTS: difficulty walking, decreased ROM, decreased strength, impaired flexibility, and pain.   ACTIVITY LIMITATIONS: lifting, standing, stairs, transfers, and locomotion level  PARTICIPATION LIMITATIONS: cleaning, shopping, and community activity  PERSONAL FACTORS: Past/current experiences, Time since onset of injury/illness/exacerbation, and 1-2 comorbidities: (Depression, Positive ANA) are also affecting patient's functional outcome.   REHAB POTENTIAL: Good  CLINICAL DECISION MAKING: Unstable/unpredictable  EVALUATION COMPLEXITY: High   GOALS: Goals reviewed with patient? Yes  SHORT TERM GOALS: Target date: 03/28/2024  Pt will be independent with HEP to improve strength and decrease knee pain to improve pain-free function at home and work. Baseline: 03/04/24: Baseline HEP initiated.    04/20/24: Pt reports completing HEP  most days.  Goal status: ACHIEVED   LONG TERM GOALS: Target date: 04/18/2024  Pt will have no nocturnal symptoms disturbing sleep as needed for improved sleep quality and long-term wellness Baseline: 03/04/24: Frequent symptoms which occur sporadically even when at rest.   04/20/24: Some back pain disturbing sleep, no leg pain during PM.  Goal status: MOSTLY MET   2.  Pt will decrease worst knee pain by at least 3 points on the NPRS in order to demonstrate clinically significant reduction in knee pain. Baseline: 03/04/24: 10/10 pain at worst.    04/20/24: Low back pain, no pain affecting knee/leg over previous week.  Goal status: MOSTLY MET   3.  Pt will decrease LEFS score by at least 9 points in order demonstrate clinically significant reduction in knee pain/disability.       Baseline: 03/04/24: 12/80 = 15%.   04/20/24: 61/80 = 76.25% Goal status: ACHIEVED    PLAN: PT FREQUENCY: 1x/week  PT DURATION: 3-4 weeks  PLANNED INTERVENTIONS: Therapeutic exercises, Therapeutic activity, Neuromuscular re-education,  Balance training, Gait training, Patient/Family education, Self Care, Joint mobilization, Joint manipulation, Vestibular training, Canalith repositioning, Orthotic/Fit training, DME instructions, Dry Needling, Electrical stimulation, Spinal manipulation, Spinal mobilization, Cryotherapy, Moist heat, Taping, Traction, Ultrasound, Ionotophoresis 4mg /ml Dexamethasone, Manual therapy, and Re-evaluation.  PLAN FOR NEXT SESSION: Neurodynamics; soft tissue stretching for adductors/quads. Manual therapy/dry needling for desensitization of affected tissues. Continued hip and core strengthening with transition to HEP toward end of scheduled visits.    Denese Finn, PT, DPT #[id number]  Aleatha Hunting, PT 05/25/2024, 8:23 AM

## 2024-06-01 NOTE — Progress Notes (Unsigned)
 PROVIDER NOTE: Interpretation of information contained herein should be left to medically-trained personnel. Specific patient instructions are provided elsewhere under Patient Instructions section of medical record. This document was created in part using AI and STT-dictation technology, any transcriptional errors that may result from this process are unintentional.  Patient: Rachel Ortega  Service: E/M   PCP: Laurence Pons, NP  DOB: Feb 23, 1996  DOS: 06/02/2024  Provider: Candi Chafe, MD  MRN: [id number]  Delivery: Face-to-face  Specialty: Interventional Pain Management  Type: Established Patient  Setting: Ambulatory outpatient facility  Specialty designation: 09  Referring Prov.: Laurence Pons, NP  Location: Outpatient office facility       History of present illness (HPI) Rachel Ortega, a 28 y.o. year old female, is here today because of her Chronic bilateral low back pain with right-sided sciatica [G89.29, M54.41]. Rachel Ortega primary complain today is Back Pain (Lower and upper ) and Knee Pain (Right )  Pertinent problems: Rachel Ortega has Positive ANA (antinuclear antibody); SS-A antibody positive; Postdural puncture headache; Postpartum spinal headache; Acute knee pain (Right); Medial knee pain (Right); Enthesopathy of knee region (Right); Right lumbar radiculitis; 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); Transient weakness of right lower extremity; and Chronic lower extremity pain (Right) on their pertinent problem list.  Pain Assessment: Severity of Chronic pain is reported as a 2 /10. Location: Back Lower, Upper, Right, Left/Denies however has pain in right knee seprerate which that can radaite from right knee to right foot. Onset: More than a month ago. Quality: Aching, Constant. Timing: Constant. Modifying factor(s): Gabapentin, PT, messages, and chiropractor. Vitals:  height is 5' 6 (1.676 m) and weight is 206 lb (93.4 kg). Her temporal temperature is 98.1 F  (36.7 C). Her blood pressure is 117/84 and her pulse is 91. Her respiration is 16 and oxygen saturation is 100%.  BMI: Estimated body mass index is 33.25 kg/m as calculated from the following:   Height as of this encounter: 5' 6 (1.676 m).   Weight as of this encounter: 206 lb (93.4 kg).  Last encounter: 03/09/2024. Last procedure: 02/19/2024.  Reason for encounter: evaluation of worsening, or previously known (established) problem.  The patient was initially seen at our practice for the treatment of a postdural puncture headache for which she was treated with an epidural blood patch.  The postdural puncture headache completely resolved but upon returning to our office she indicated that she needed evaluation for a new problem.  This new problem consisted of right knee pain for which we did a right intra-articular steroid knee injection on 02/19/2024.  The patient was last seen on 03/09/2024 at which time she indicated having attained absolutely no short-term or long-term benefit from the intra-articular knee injection.  Further evaluation opened the possibility of the knee pain has been radicular and for that reason we entered an order for a lumbar MRI.  The patient was instructed to contact us  after having completed the MRI or if the problem worsened.  The MRI done on 04/07/2024 indicated no significant canal or foraminal stenosis and no disc herniations.  Discussed the use of AI scribe software for clinical note transcription with the patient, who gave verbal consent to proceed.  History of Present Illness   Rachel Ortega is a 28 year old female who presents with right knee pain and occasional radiating leg pain.  She experiences right knee pain, particularly after prolonged walking, described as an 'achy' sensation similar to previous  sharp pain. The pain is localized to the front and medial aspect of the knee, around the patella and infrapatellar ligament, and can be severe enough to prevent  walking. No knee instability or weakness, but the pain can be debilitating.  There is a scar on the medial aspect of the right knee, below the patella, acquired in February at [facility]. Although the scar is healing, she experiences discomfort in the same area.  The knee pain can occur randomly, even when not bearing weight, such as while lying in bed or sitting. Occasionally, the pain radiates from the thigh down to the big toe. No knee instability, weakness, or other neurological symptoms.  She has undergone physical therapy, which she found beneficial. She uses gabapentin, taking one in the morning and two at night, which effectively manages her symptoms without side effects. She also takes Unisom to aid sleep, as she often feels tired, partly due to caring for her six-month-old child.  Her vitamin D levels are very low, which could contribute to her fatigue and pain sensitivity.       Pharmacotherapy Assessment   Analgesic: No chronic opioid analgesics therapy prescribed by our practice. Oxycodone /APAP 5/325, 1 tab p.o. 4 times daily (# 12) (last filled on 11/16/2023) MME/day: 30 mg/day   Monitoring: [place] PMP: PDMP reviewed during this encounter.       Pharmacotherapy: No side-effects or adverse reactions reported. Compliance: No problems identified. Effectiveness: Clinically acceptable.  Huston Maiers, RN  06/02/2024  9:14 AM  Sign when Signing Visit Safety precautions to be maintained throughout the outpatient stay will include: orient to surroundings, keep bed in low position, maintain call bell within reach at all times, provide assistance with transfer out of bed and ambulation.     UDS:  No results found for: SUMMARY  No results found for: CBDTHCR No results found for: D8THCCBX No results found for: D9THCCBX  ROS  Constitutional: Denies any fever or chills Gastrointestinal: No reported hemesis, hematochezia, vomiting, or acute GI distress Musculoskeletal:  Denies any acute onset joint swelling, redness, loss of ROM, or weakness Neurological: No reported episodes of acute onset apraxia, aphasia, dysarthria, agnosia, amnesia, paralysis, loss of coordination, or loss of consciousness  Medication Review  EPINEPHrine, Vitamin D (Ergocalciferol), acetaminophen , baclofen, doxycycline , doxylamine (Sleep), gabapentin, ibuprofen , ivermectin, norelgestromin-ethinyl estradiol, valACYclovir, and venlafaxine   History Review  Allergy: [name] is allergic to codeine, shrimp (diagnostic), hydrocod poli-chlorphe poli er, and sulfa antibiotics. Drug: Ms. Sicard  reports no history of drug use. Alcohol:  reports that she does not currently use alcohol. Tobacco:  reports that she has never smoked. She has never used smokeless tobacco. Social: Ms. Galyean  reports that she has never smoked. She has never used smokeless tobacco. She reports that she does not currently use alcohol. She reports that she does not use drugs. Medical:  has a past medical history of Depression, Medical history non-contributory, and Preeclampsia, third trimester (02/05/2018). Surgical: Ms. Crosland  has a past surgical history that includes Tonsillectomy (Bilateral); Wisdom tooth extraction (Bilateral); and Tonsillectomy. Family: family history includes Cancer in her maternal grandfather; Healthy in her father; Lupus in her mother; Rheum arthritis in her mother.  Laboratory Chemistry Profile   Renal Lab Results  Component Value Date   BUN 9 11/15/2023   CREATININE 0.56 11/15/2023   LABCREA 97 02/05/2018   GFRAA >60 02/06/2018   GFRNONAA >60 11/15/2023    Hepatic Lab Results  Component Value Date   AST 26 11/15/2023  ALT 21 11/15/2023   ALBUMIN 3.0 (L) 11/15/2023   ALKPHOS 69 11/15/2023    Electrolytes Lab Results  Component Value Date   NA 141 11/15/2023   K 3.3 (L) 11/15/2023   CL 108 11/15/2023   CALCIUM  8.4 (L) 11/15/2023    Bone No results found for: VD25OH,  [id number], JW1191YN8, GN5621HY8, 25OHVITD1, 25OHVITD2, 25OHVITD3, TESTOFREE, TESTOSTERONE  Inflammation (CRP: Acute Phase) (ESR: Chronic Phase) No results found for: CRP, ESRSEDRATE, LATICACIDVEN       Note: Above Lab results reviewed.  Recent Imaging Review  MR LUMBAR SPINE WO CONTRAST CLINICAL DATA:  Low back pain, radiculopathy  EXAM: MRI LUMBAR SPINE WITHOUT CONTRAST  TECHNIQUE: Multiplanar, multisequence MR imaging of the lumbar spine was performed. No intravenous contrast was administered.  COMPARISON:  None Available.  FINDINGS: Segmentation: Standard.  Alignment:  Physiologic lumbar alignment is maintained.  Vertebrae: Vertebral bodies demonstrate normal signal intensity. No compression fractures.  Conus medullaris and cauda equina: The conus medullaris terminates at the level of L1-L2. The distal spinal cord signal intensity is normal.  Paraspinal and other soft tissues: The visualized abdomen and pelvis show no soft tissue abnormality. The visualized aorta is normal.  Disc levels:  L1-L2: Disc is normal in configuration. No facet arthropathy. No neuroforaminal stenosis. No spinal canal stenosis.  L2-L3: Disc is normal in configuration. No facet arthropathy. No neuroforaminal stenosis. No spinal canal stenosis.  L3-L4: Disc is normal in configuration. No facet arthropathy. No neuroforaminal stenosis. No spinal canal stenosis.  L4-L5: Disc is normal in configuration. No facet arthropathy. No neuroforaminal stenosis. No spinal canal stenosis.  L5-S1: Disc is normal in configuration. No facet arthropathy. No neuroforaminal stenosis. No spinal canal stenosis.  IMPRESSION: No significant canal or foraminal stenosis. No disc herniation.  Electronically Signed   By: Johnanna Mylar M.D.   On: 04/30/2024 10:28 Note: Reviewed         Physical Exam  General appearance: Well nourished, well developed, and well hydrated. In no apparent  acute distress Mental status: Alert, oriented x 3 (person, place, & time)       Respiratory: No evidence of acute respiratory distress Eyes: PERLA Vitals: BP 117/84 (Patient Position: Sitting, Cuff Size: Normal)   Pulse 91   Temp 98.1 F (36.7 C) (Temporal)   Resp 16   Ht 5' 6 (1.676 m)   Wt 206 lb (93.4 kg)   LMP 05/19/2024 (Exact Date)   SpO2 100%   Breastfeeding No   BMI 33.25 kg/m  BMI: Estimated body mass index is 33.25 kg/m as calculated from the following:   Height as of this encounter: 5' 6 (1.676 m).   Weight as of this encounter: 206 lb (93.4 kg). Ideal: Ideal body weight: 59.3 kg (130 lb 11.7 oz) Adjusted ideal body weight: 73 kg (160 lb 13.4 oz)  Assessment   Diagnosis Status  1. 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 Chronic lower extremity pain (Right)   3. Right lumbar radiculitis   4. Transient weakness of right lower extremity   5. Medial knee pain (Right)   6. Enthesopathy of knee region (Right)   7. Acute knee pain (Right)    Controlled Controlled Controlled   Updated Problems: No problems updated.  Plan of Care  Problem-specific:  Assessment and Plan    Knee pain   She experiences intermittent anterior and medial right knee pain, worsened by activity and sometimes present at rest. Imaging reveals normal bone structure without significant stenosis  or disc herniation, indicating musculoskeletal issues. Gabapentin effectively manages nerve-related pain, suggesting a nerve component. Continue physical therapy with a focus on stretching exercises. Use a TENS unit for pain management with provided electrode placement guidance. Continue gabapentin for nerve-related pain management and monitor renal function annually due to long-term use. Consider vitamin D supplementation to enhance energy levels and reduce pain sensitivity.  Nerve pain possibly due to nerve irritation   Pain radiates from the thigh to the big toe, suggesting possible  L5 nerve root irritation. Gabapentin effectively manages symptoms, indicating a nerve component. No weakness or numbness is reported. Nerve recovery typically occurs within a year. Continue gabapentin for nerve pain management and monitor renal function annually due to long-term use.  Lumbar spine curvature   She has a mild leftward lumbar spine curvature with no acute fracture, dislocation, or instability. MRI shows no significant stenosis or disc herniation.  Vitamin D deficiency   Low vitamin D levels contribute to increased pain sensitivity and fatigue. Supplementation is recommended to improve energy levels and reduce pain sensitivity. Administer vitamin D supplements in the morning to avoid sleep interference.       Rachel Ortega has a current medication list which includes the following long-term medication(s): gabapentin, norelgestromin-ethinyl estradiol, and venlafaxine .  Pharmacotherapy (Medications Ordered): No orders of the defined types were placed in this encounter.  Orders:  No orders of the defined types were placed in this encounter.    Interventional Therapies  Risk Factors  Considerations  Medical Comorbidities:     Planned  Pending:   Diagnostic right IA steroid knee joint injection #1    Under consideration:   Possible physical therapy for knee pain should she continue to have pain after steroid taper. Possible right knee MRI should she continue having the pain despite the steroid taper and the physical therapy. Possible diagnostic right knee injections should all the above fails and pathology is found on the right knee MRI.   Completed:   Therapeutic Lumbar epidural blood patch x1 (11/18/2023) (100/100/100/100)   Therapeutic  Palliative (PRN) options:   None established   Completed by other providers:   None reported     Return if symptoms worsen or fail to improve.    Recent Visits Date Type Provider Dept  03/09/24 Office Visit Renaldo Caroli, MD [facility]  Showing recent visits within past 90 days and meeting all other requirements Today's Visits Date Type Provider Dept  06/02/24 Office Visit Renaldo Caroli, MD [facility]  Showing today's visits and meeting all other requirements Future Appointments No visits were found meeting these conditions. Showing future appointments within next 90 days and meeting all other requirements  I discussed the assessment and treatment plan with the patient. The patient was provided an opportunity to ask questions and all were answered. The patient agreed with the plan and demonstrated an understanding of the instructions.  Patient advised to call back or seek an in-person evaluation if the symptoms or condition worsens.  Duration of encounter: 30 minutes.  Total time on encounter, as per AMA guidelines included both the face-to-face and non-face-to-face time personally spent by the physician and/or other qualified health care professional(s) on the day of the encounter (includes time in activities that require the physician or other qualified health care professional and does not include time in activities normally performed by clinical staff). Physician's time may include the following activities when performed: Preparing to see the patient (e.g., pre-charting review of records,  searching for previously ordered imaging, lab work, and nerve conduction tests) Review of prior analgesic pharmacotherapies. Reviewing PMP Interpreting ordered tests (e.g., lab work, imaging, nerve conduction tests) Performing post-procedure evaluations, including interpretation of diagnostic procedures Obtaining and/or reviewing separately obtained history Performing a medically appropriate examination and/or evaluation Counseling and educating the patient/family/caregiver Ordering medications, tests, or procedures Referring and communicating with other health care professionals  (when not separately reported) Documenting clinical information in the electronic or other health record Independently interpreting results (not separately reported) and communicating results to the patient/ family/caregiver Care coordination (not separately reported)  Note by: Candi Chafe, MD (TTS and AI technology used. I apologize for any typographical errors that were not detected and corrected.) Date: 06/02/2024; Time: 10:12 AM

## 2024-06-02 ENCOUNTER — Ambulatory Visit: Attending: Pain Medicine | Admitting: Pain Medicine

## 2024-06-02 ENCOUNTER — Encounter: Payer: Self-pay | Admitting: Pain Medicine

## 2024-06-02 VITALS — BP 117/84 | HR 91 | Temp 98.1°F | Resp 16 | Ht 66.0 in | Wt 206.0 lb

## 2024-06-02 DIAGNOSIS — M5441 Lumbago with sciatica, right side: Secondary | ICD-10-CM | POA: Diagnosis not present

## 2024-06-02 DIAGNOSIS — M25561 Pain in right knee: Secondary | ICD-10-CM | POA: Insufficient documentation

## 2024-06-02 DIAGNOSIS — M76891 Other specified enthesopathies of right lower limb, excluding foot: Secondary | ICD-10-CM | POA: Diagnosis present

## 2024-06-02 DIAGNOSIS — M79604 Pain in right leg: Secondary | ICD-10-CM | POA: Insufficient documentation

## 2024-06-02 DIAGNOSIS — M5416 Radiculopathy, lumbar region: Secondary | ICD-10-CM | POA: Diagnosis not present

## 2024-06-02 DIAGNOSIS — G8929 Other chronic pain: Secondary | ICD-10-CM | POA: Insufficient documentation

## 2024-06-02 DIAGNOSIS — R29898 Other symptoms and signs involving the musculoskeletal system: Secondary | ICD-10-CM | POA: Diagnosis present

## 2024-06-02 NOTE — Progress Notes (Signed)
 Safety precautions to be maintained throughout the outpatient stay will include: orient to surroundings, keep bed in low position, maintain call bell within reach at all times, provide assistance with transfer out of bed and ambulation.

## 2024-06-02 NOTE — Patient Instructions (Signed)
 ______________________________________________________________________    TENS (Device can be purchased online, without prescription. Search: TENS 7000.) Transcutaneous electrical nerve stimulation (TENS) is a method of pain relief involving the use of a mild electrical current. A TENS machine is a small, battery-operated device that has leads connected to sticky pads called electrodes.   Rechargeable 9V batteries:     Electrode placement:   TENS UNIT SAFETY WARNING SHEET and INFORMATION INDICATIONS AND CONTRAINDICTIONS Read the operation manual before using the device. Freight forwarder ([place] ) restricts this device to sale by or on the order of a physician. Observe your physician's precise instructions and let him show you where to apply the electrodes. For a successful therapy, the correct application of the electrodes is an important factor. Carefully write down the settings your physician recommended. Indications for use This device is a prescription device and only for symptomatic relief of chronic intractable pain. Contraindications:   Any electrode placement that applies current to the carotid sinus (neck) region.   Patients with implanted electronic devices (for example, a pacemaker) or metallic implants should not undertake.   Any electrode placement that causes current to flow transcerebrally (through the head). The use of unit whenever pain symptoms are undiagnosed, unit etiology is determined.   The use of TENS whenever pain syndromes are undiagnosed, until etiology is established.  WARNINGS AND PRECAUTIONS  Warnings:   The device must be kept out of reach of children.   The safety of device for use during pregnancy or delivery has not been established.   Do not place electrodes on front of the throat. This may result in spasms of the laryngeal and pharyngeal muscles.   Do not place the electrodes over the carotid nerve (side of neck below ear).   The device is not effective for pain of  central origin (headaches).   The device may interfere with electronic monitoring equipment (such as ECG monitors and ECG alarms).   Electrodes should not be placed over the eyes, in the mouth, or internally.   These devices have no curative value.   TENS devices should be used only under the continued supervision of a physician.   TENS is a symptomatic treatment and as such suppresses the sensation of pain which would otherwise serve as a protective mechanism. Precautions/Adverse Reactions   Isolated cases of skin irritation may occur at the site of electrode placement following long-term application.   Stimulation should be stopped and electrodes removed until the cause of the irritation can be determined.   Effectiveness is highly dependent upon patient selection by a person qualified in the management of pain patients.   If the device treatment becomes ineffective or unpleasant, stimulation should be discontinued until reevaluation by a physician/clinician.   Always turn the device off before applying or removing electrodes.   Skin irritation and electrode burns are potential adverse reactions.  PURPOSE: A Transcutaneous Electrical Nerve Stimulator, or TENS, unit is designed to relieve post-operative, acute and chronic pain. It is used for pain caused by peripheral nerves and not central. TENS units are prescription-only devices.  OPERATION: TENS units work in a couple of ways. The first way they are thought to work is by a method called the [employer]. The [employer] states that our brains can only handle one stimulus at a time. When you have chronic pain, this pain signal is constantly being sent to your brain and recognized as pain. When an electrical stimulus is added to the area of pain the body feels  this electrical stimulus, and since the brain can only handle one thing at a time, the pain is not transmitted to the brain. The second method thought to be part of TENS unit's success is by way of  stimulating our own bodies to release their own natural painkillers. TENS units do not work for everyone and results may vary. Always follow the instructions and warnings in your user's manual.  USE: One of the most important tasks that must be performed is battery maintenance. If you are using a [employer] ge[employer], always fully charge it and fully deplete it before charging it again. These batteries can develop memories and by not performing this charging task correctly, your battery's life can be greatly diminished. If your battery does develop a memory you can help expand the memory by charging for 12 - 13 hours and then completely depleting the battery. Always prepare the skin before applying electrodes. Your skin should be clean and free of any lotions or creams. If you are using electrodes that use conductive gel, apply a small, even layer over the electrode. For carbon, self-adhesive electrodes, apply a drop of water to the electrodes before applying to the skin. The electrodes attach to the lead wires and then the TENS unit. Always grasp the connector and not the cord when inserting or removing. When making adjustments, always make sure the unit's channels (1 and 2) are in the OFF position. The actual settings should be recommended and prescribed by your physician. Medical equipment suppliers don'tset or instruct users as to user settings. When you are using the BURST mode, the unit delivers a series of quick pulses followed by a rest. This cycle repeats itself frequently. Always have channels OFF before changing modes.  For MODULATION mode, the stimulation automatically varies the width of the pulse.  For CONVENTIONAL mode, the stimulation is constant. After the settings have been fine-tuned, set the timer to 30 or 60 minutes. Your physician should also prescribe the use time. When the lights become dim, it means your batteries should be replaced or recharged.  ACCESSORIES: The  electrodes and lead wires can be obtained from your medical equipment supplier. Your medical equipment supplier can set up a recurring delivery to accommodate your needs. Electrodes should be replaced once a month and lead wires once every 6 months.  Video Tutorial [email or web address]://[email or web address].be/[email or web address]  ______________________________________________________________________

## 2024-06-07 ENCOUNTER — Ambulatory Visit: Admitting: Pain Medicine

## 2024-06-15 ENCOUNTER — Encounter: Payer: Self-pay | Admitting: Physical Therapy

## 2024-06-15 ENCOUNTER — Ambulatory Visit: Attending: Pain Medicine | Admitting: Physical Therapy

## 2024-06-15 DIAGNOSIS — M79604 Pain in right leg: Secondary | ICD-10-CM | POA: Insufficient documentation

## 2024-06-15 DIAGNOSIS — R262 Difficulty in walking, not elsewhere classified: Secondary | ICD-10-CM | POA: Insufficient documentation

## 2024-06-15 NOTE — Therapy (Unsigned)
 OUTPATIENT PHYSICAL THERAPY TREATMENT/RE-CERTIFICATION   Patient Name: Rachel Ortega MRN: [id number] DOB:09/10/1996, 28 y.o., female Today's Date: 06/15/2024  END OF SESSION:  PT End of Session - 06/15/24 1441     Visit Number 12    Number of Visits 15    Date for PT Re-Evaluation 04/15/24    PT Start Time 1437    PT Stop Time 1517    PT Time Calculation (min) 40 min    Activity Tolerance Patient tolerated treatment well;No increased pain    Behavior During Therapy [facility] for tasks assessed/performed            Past Medical History:  Diagnosis Date   Depression    Medical history non-contributory    Preeclampsia, third trimester 02/05/2018   Past Surgical History:  Procedure Laterality Date   TONSILLECTOMY Bilateral    TONSILLECTOMY     WISDOM TOOTH EXTRACTION Bilateral    Patient Active Problem List   Diagnosis Date Noted   Chronic lower extremity pain (Right) 03/15/2024   Right lumbar radiculitis 03/09/2024   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) 03/09/2024   Transient weakness of right lower extremity 03/09/2024   Obesity, Class I, BMI 30-34.9 02/19/2024   Enthesopathy of knee region (Right) 01/27/2024   Acute knee pain (Right) 01/07/2024   Medial knee pain (Right) 01/07/2024   Postdural puncture headache 11/18/2023   Postpartum spinal headache 11/18/2023   Anxiety due to invasive procedure 11/18/2023   NSVD (normal spontaneous vaginal delivery) 11/13/2023   Gestational diabetes 11/11/2023   Postpartum hemorrhage 11/11/2023   Spotting affecting pregnancy 09/22/2023   Vaginal bleeding during pregnancy 09/22/2023   SS-A antibody positive 09/22/2023   Obesity affecting pregnancy 09/22/2023   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, antepartum 09/22/2023   Rash and nonspecific skin eruption 09/19/2023   Anxiety and depression 07/14/2023   History of pre-eclampsia in prior pregnancy, currently pregnant 07/14/2023   Positive ANA  (antinuclear antibody) 07/14/2023   Supervision of high risk pregnancy in second trimester 04/22/2023   Thrombocytopenia due to blood loss 02/07/2018   family H/O inherited metabolic disease 91/97/7981    PCP: Geralene Levorn ORN, NP  REFERRING PROVIDER: Tanya Glisson, MD  REFERRING DIAG:  [phone] (ICD-10-CM) - Acute pain of right knee  M25.561 (ICD-10-CM) - Right medial knee pain  M76.891 (ICD-10-CM) - Enthesopathy of right knee region    RATIONALE FOR EVALUATION AND TREATMENT: Rehabilitation  THERAPY DIAG: Pain in right leg  Difficulty in walking, not elsewhere classified  ONSET DATE: 10/2023  FOLLOW-UP APPT SCHEDULED WITH REFERRING PROVIDER: Yes ; 03/09/24   PERTINENT HISTORY: Pt is a 28 year old female with primary c/o R knee/medial thigh/leg pain. Pt had injections on 02/19/24 in R knee that did not help. Patient reports negative experience with epidural, and she feels that her symptoms began after this. Patient reports burning all of a sudden pain that can go down to her foot. She states it is debilitating. Pt is taking Gabapentin for pain control. No significant integumentary change. Her child is 46 months old and she has 55-year-old. Pt reports doing well with childcare duties. Pt reports being unable to step on RLE when she is having episode of pain. Pt reports some nocturnal symptoms and even pain when at rest; sporadic onset of symptoms. Symptoms go away after 1-2 minutes. Pt is ANA positive; no active autoimmune disease.   PAIN:   Pain Intensity: Present: 0/10, Best: 0/10, Worst: 10/10 Pain location: anteromedial knee along  medial peripatellar region  Pain quality: burning Radiating pain: Yes ; radiating from distomedial thigh down to dorsum of foot Swelling: No  Popping, catching, locking: No  Numbness/Tingling: No Focal weakness or buckling: No Aggravating factors: Hurts sporadically without aggravating position/posture Relieving factors: Gabapentin, massage  along distomedial thigh/VMO  24-hour pain behavior: None  History of prior back, hip, or knee injury, pain, surgery, or therapy: Yes; hx of back pain and chiropractic   Imaging: Yes ;  X-rays negative  Prior level of function: Independent 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: Relaxing with family   Living Environment Lives with: lives with their partner, 26 y/o daughter, 20-month-old boy Lives in: House/apartment  Patient Goals: Knee pain gone; able to get off of Gabapentin    OBJECTIVE:   Patient Surveys  LEFS 12/80 = 15%  Gross Musculoskeletal Assessment Tremor: None Bulk: Normal Tone: Normal Pt squats with weight on forefoot and is able to maintain SLR well with control of pronation of ankles    GAIT: Distance walked: 50 ft Assistive device utilized: None Level of assistance: Complete Independence Comments: L>R ankle overpronation  AROM  Thoracolumbar AROM Flexion 75% (no pain) Extension WNL (no pain) Lateral flexion: R 50%, L 100% Rotation: R 100%, L 75%  04/20/24 Flexion 75% (no pain) Extension WNL (no pain) Lateral flexion: R 50%, L 100% Rotation: R 100%, L 75%    AROM (Normal range in degrees) AROM 03/04/24  Hip Right Left  Flexion (125) WNL   Extension (15)    Abduction (40)    Adduction     Internal Rotation (45) WNL   External Rotation (45) WNL       Knee    Flexion (135) WNL   Extension (0) WNL       (* = pain; Blank rows = not tested)  LE MMT: MMT (out of 5) Right 03/04/24 Left 03/04/24  Hip flexion 4+ 4+  Hip extension 4 4  Hip abduction 4+ 5  Hip adduction    Hip internal rotation 5 5  Hip external rotation 4 4+  Knee flexion 5 5  Knee extension 5 5  Ankle dorsiflexion    Ankle plantarflexion    Ankle inversion    Ankle eversion    (* = pain; Blank rows = not tested)  Reflexes R/L Knee Jerk (L3/4): 2+/2+ Ankle Jerk (S1/2): 2+/2+ Clonus: Negative bilat  Muscle Length Hamstrings: R: Positive L:  Positive Quadriceps (Ely): R: Positive L: Positive  Palpation TTP pes anserinus 1, adductor longus 1 Graded on 0-4 scale (0 = no pain, 1 = pain, 2 = pain with wincing/grimacing/flinching, 3 = pain with withdrawal, 4 = unwilling to allow palpation)  VASCULAR Dorsalis pedis and posterior tibial pulses are palpable   SPECIAL TESTS  Lumbar Radiculopathy SLUMP: R Negative, L Negative SLR: R Negative, L Negative  Prone Knee Bend: R Positive, L Negative  Meniscus Tests McMurray's Test:  Medial Meniscus (Tibial ER): R: Negative L: Negative Effusion: R: Negative L: Negative   Patellofemoral Pain Syndrome Patellar Tilt (Lateral): R: Not examined L: Not examined Patellar Apprehension: R: Negative L: Negative Squatting pain: R: Negative L: Negative Resisted knee extension pain: R: Negative L: Negative Compression: R: Not examined L: Not examined Clarke's sign: R: Not examined L: Not examined Lateral Pull: R: Negative L: Negative     TODAY'S TREATMENT          06/15/2024   SUBJECTIVE STATEMENT:   Patient reports that her pain is relatively controlled well with use  of Gabapentin. 3/10 pain along distal/distomedial thigh at arrival to PT. Patient reports suprapatellar region has been bothering her more recently. Patient reports some comorbid L paracervical pain. Patient reports no nocturnal symptoms recently.    Thoracolumbar AROM  Flexion 75% (no pain) Extension WNL (no pain) Lateral flexion: R WNL, L WNL Rotation: R 100%, L 100%   Manual Therapy - for symptom modulation, soft tissue sensitivity and mobility, joint mobility, ROM   STM and IASTM along R vastus medialis and adductor magnus/longus/gracilis x 8 minutes Manual hip adductor stretch; 2 x 30 sec   Therapeutic Exercise - for improved soft tissue flexibility and extensibility as needed for ROM, neurodynamics to improve neural tension and reduce neuralgias/paresthesias  Seated elliptical; x 5 min, Level 6.5  -interval  subjective gathered, 3 minutes not billed  Butterfly stretch; 3 x 30 sec    PATIENT EDUCATION: HEP updated/reviewed. Discussed re-assessment for next appointment and consideration of discharge versus further tapering of PT visits.    *next visit* Physioball bridge, Silver physioball; 2 x 10  Dying bug, alternating UE/LE with LE hover end of bed; 2 x 10 alt Sidelying hip abduction; 4-lb ankle weights; 2 x 10, bilat   *not today* Supine hip abduction stretch, with blanket looped around foot; 3 x 30 sec Bird dog; reviewed Prone hip extension, reviewed Quadruped Academic librarian; 2 x 10, performed on each LE  Sidelying clamshell;  2 x 10 on each side, Green Tband  Glute bridge, with alternating leg extension; 3x5 alt R/L Prone Quadriceps stretch; 2 x 30 sec Quadruped donkey kick; 2 x 10 on either side  Prone femoral nerve flossing; 20x Prone femoral nerve tensioner; 20x Supine active hamstring stretch; 20x   PATIENT EDUCATION:  Education details: see above for patient education details Person educated: Patient Education method: Explanation, Demonstration, and Handouts Education comprehension: verbalized understanding and returned demonstration   HOME EXERCISE PROGRAM:  Access Code: [email or web address] URL: [email or web address] Date: 05/18/2024 Prepared by: Venetia Endo  Exercises - Prone Femoral Nerve Mobilization  - 3 x daily - 7 x weekly - 2 sets - 10 reps - 1-2sec hold - Supine Hamstring Stretch  - 3 x daily - 7 x weekly - 2 sets - 10 reps - 1-2sec hold - Supine Butterfly Groin Stretch  - 3 x daily - 7 x weekly - 3 sets - 30sec hold - Supine Bridge  - 1 x daily - 4-5 x weekly - 2 sets - 10 reps - 3sec hold - Bird Dog  - 1 x daily - 4-5 x weekly - 2 sets - 10 reps - Prone Hip Extension  - 1 x daily - 4-5 x weekly - 2 sets - 10 reps - Supine Dead Bug with Leg Extension  - 1 x daily - 4 x weekly - 2 sets - 10 reps   ASSESSMENT:  CLINICAL IMPRESSION: Patient has  comorbid cervicothoracic pain for which she is undergoing chiropractic treatment. Pt reports no recent significant re-occurrence of RLE pain, but she is also still taking Gabapentin 2x/day. Today, we discussed following up with physician regarding how to taper from medication versus immediately stopping. Pt tolerates progression of core and hip strengthening well today without notable pain. Pt will continue to benefit from skilled PT services to address deficits and improve function.    OBJECTIVE IMPAIRMENTS: difficulty walking, decreased ROM, decreased strength, impaired flexibility, and pain.   ACTIVITY LIMITATIONS: lifting, standing, stairs, transfers, and locomotion level  PARTICIPATION LIMITATIONS: cleaning, shopping, and community activity  PERSONAL FACTORS: Past/current experiences, Time since onset of injury/illness/exacerbation, and 1-2 comorbidities: (Depression, Positive ANA) are also affecting patient's functional outcome.   REHAB POTENTIAL: Good  CLINICAL DECISION MAKING: Unstable/unpredictable  EVALUATION COMPLEXITY: High   GOALS: Goals reviewed with patient? Yes  SHORT TERM GOALS: Target date: 03/28/2024  Pt will be independent with HEP to improve strength and decrease knee pain to improve pain-free function at home and work. Baseline: 03/04/24: Baseline HEP initiated.    04/20/24: Pt reports completing HEP most days.  Goal status: ACHIEVED   LONG TERM GOALS: Target date: 04/18/2024  Pt will have no nocturnal symptoms disturbing sleep as needed for improved sleep quality and long-term wellness Baseline: 03/04/24: Frequent symptoms which occur sporadically even when at rest.   04/20/24: Some back pain disturbing sleep, no leg pain during PM.     06/15/24: No recent nocturnal symptoms Goal status: ACHIEVED  2.  Pt will decrease worst knee pain by at least 3 points on the NPRS in order to demonstrate clinically significant reduction in knee pain. Baseline: 03/04/24: 10/10 pain  at worst.    04/20/24: Low back pain, no pain affecting knee/leg over previous week.     06/15/24: 7/10 at worst over previous week.  Goal status: MOSTLY MET   3.  Pt will decrease LEFS score by at least 9 points in order demonstrate clinically significant reduction in knee pain/disability.       Baseline: 03/04/24: 12/80 = 15%.   04/20/24: 61/80 = 76.25% Goal status: ACHIEVED    PLAN: PT FREQUENCY: 1x/week  PT DURATION: 3-4 weeks  PLANNED INTERVENTIONS: Therapeutic exercises, Therapeutic activity, Neuromuscular re-education, Balance training, Gait training, Patient/Family education, Self Care, Joint mobilization, Joint manipulation, Vestibular training, Canalith repositioning, Orthotic/Fit training, DME instructions, Dry Needling, Electrical stimulation, Spinal manipulation, Spinal mobilization, Cryotherapy, Moist heat, Taping, Traction, Ultrasound, Ionotophoresis 4mg /ml Dexamethasone, Manual therapy, and Re-evaluation.  PLAN FOR NEXT SESSION: Neurodynamics; soft tissue stretching for adductors/quads. Manual therapy/dry needling for desensitization of affected tissues. Continued hip and core strengthening with transition to HEP toward end of scheduled visits.    Venetia Endo, PT, DPT #[id number]  Venetia ONEIDA Endo, PT 06/15/2024, 2:41 PM

## 2024-06-16 ENCOUNTER — Encounter: Payer: Self-pay | Admitting: Physical Therapy

## 2024-06-23 ENCOUNTER — Ambulatory Visit: Attending: Pain Medicine | Admitting: Physical Therapy

## 2024-06-23 ENCOUNTER — Encounter: Payer: Self-pay | Admitting: Physical Therapy

## 2024-06-23 DIAGNOSIS — R262 Difficulty in walking, not elsewhere classified: Secondary | ICD-10-CM | POA: Insufficient documentation

## 2024-06-23 DIAGNOSIS — M79604 Pain in right leg: Secondary | ICD-10-CM | POA: Insufficient documentation

## 2024-06-23 NOTE — Therapy (Signed)
 OUTPATIENT PHYSICAL THERAPY TREATMENT   Patient Name: Rachel Ortega MRN: [id number] DOB:1996/10/06, 28 y.o., female Today's Date: 06/23/2024  END OF SESSION:  PT End of Session - 06/23/24 1630     Visit Number 13    Number of Visits 15    Date for PT Re-Evaluation 04/15/24    PT Start Time 1630    PT Stop Time 1711    PT Time Calculation (min) 41 min    Activity Tolerance Patient tolerated treatment well;No increased pain    Behavior During Therapy [facility] for tasks assessed/performed          Past Medical History:  Diagnosis Date   Depression    Medical history non-contributory    Preeclampsia, third trimester 02/05/2018   Past Surgical History:  Procedure Laterality Date   TONSILLECTOMY Bilateral    TONSILLECTOMY     WISDOM TOOTH EXTRACTION Bilateral    Patient Active Problem List   Diagnosis Date Noted   Chronic lower extremity pain (Right) 03/15/2024   Right lumbar radiculitis 03/09/2024   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) 03/09/2024   Transient weakness of right lower extremity 03/09/2024   Obesity, Class I, BMI 30-34.9 02/19/2024   Enthesopathy of knee region (Right) 01/27/2024   Acute knee pain (Right) 01/07/2024   Medial knee pain (Right) 01/07/2024   Postdural puncture headache 11/18/2023   Postpartum spinal headache 11/18/2023   Anxiety due to invasive procedure 11/18/2023   NSVD (normal spontaneous vaginal delivery) 11/13/2023   Gestational diabetes 11/11/2023   Postpartum hemorrhage 11/11/2023   Spotting affecting pregnancy 09/22/2023   Vaginal bleeding during pregnancy 09/22/2023   SS-A antibody positive 09/22/2023   Obesity affecting pregnancy 09/22/2023   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, antepartum 09/22/2023   Rash and nonspecific skin eruption 09/19/2023   Anxiety and depression 07/14/2023   History of pre-eclampsia in prior pregnancy, currently pregnant 07/14/2023   Positive ANA (antinuclear antibody)  07/14/2023   Supervision of high risk pregnancy in second trimester 04/22/2023   Thrombocytopenia due to blood loss 02/07/2018   family H/O inherited metabolic disease 91/97/7981    PCP: Geralene Levorn ORN, NP  REFERRING PROVIDER: Tanya Glisson, MD  REFERRING DIAG:  [phone] (ICD-10-CM) - Acute pain of right knee  M25.561 (ICD-10-CM) - Right medial knee pain  M76.891 (ICD-10-CM) - Enthesopathy of right knee region    RATIONALE FOR EVALUATION AND TREATMENT: Rehabilitation  THERAPY DIAG: No diagnosis found.  ONSET DATE: 10/2023  FOLLOW-UP APPT SCHEDULED WITH REFERRING PROVIDER: Yes ; 03/09/24   PERTINENT HISTORY: Pt is a 28 year old female with primary c/o R knee/medial thigh/leg pain. Pt had injections on 02/19/24 in R knee that did not help. Patient reports negative experience with epidural, and she feels that her symptoms began after this. Patient reports burning all of a sudden pain that can go down to her foot. She states it is debilitating. Pt is taking Gabapentin for pain control. No significant integumentary change. Her child is 34 months old and she has 68-year-old. Pt reports doing well with childcare duties. Pt reports being unable to step on RLE when she is having episode of pain. Pt reports some nocturnal symptoms and even pain when at rest; sporadic onset of symptoms. Symptoms go away after 1-2 minutes. Pt is ANA positive; no active autoimmune disease.   PAIN:   Pain Intensity: Present: 0/10, Best: 0/10, Worst: 10/10 Pain location: anteromedial knee along medial peripatellar region  Pain quality: burning Radiating pain: Yes ;  radiating from distomedial thigh down to dorsum of foot Swelling: No  Popping, catching, locking: No  Numbness/Tingling: No Focal weakness or buckling: No Aggravating factors: Hurts sporadically without aggravating position/posture Relieving factors: Gabapentin, massage along distomedial thigh/VMO  24-hour pain behavior: None  History of  prior back, hip, or knee injury, pain, surgery, or therapy: Yes; hx of back pain and chiropractic   Imaging: Yes ;  X-rays negative  Prior level of function: Independent 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: Relaxing with family   Living Environment Lives with: lives with their partner, 44 y/o daughter, 69-month-old boy Lives in: House/apartment  Patient Goals: Knee pain gone; able to get off of Gabapentin    OBJECTIVE:   Patient Surveys  LEFS 12/80 = 15%  Gross Musculoskeletal Assessment Tremor: None Bulk: Normal Tone: Normal Pt squats with weight on forefoot and is able to maintain SLR well with control of pronation of ankles    GAIT: Distance walked: 50 ft Assistive device utilized: None Level of assistance: Complete Independence Comments: L>R ankle overpronation  AROM  Thoracolumbar AROM Flexion 75% (no pain) Extension WNL (no pain) Lateral flexion: R 50%, L 100% Rotation: R 100%, L 75%  04/20/24 Flexion 75% (no pain) Extension WNL (no pain) Lateral flexion: R 50%, L 100% Rotation: R 100%, L 75%    AROM (Normal range in degrees) AROM 03/04/24  Hip Right Left  Flexion (125) WNL   Extension (15)    Abduction (40)    Adduction     Internal Rotation (45) WNL   External Rotation (45) WNL       Knee    Flexion (135) WNL   Extension (0) WNL       (* = pain; Blank rows = not tested)  LE MMT: MMT (out of 5) Right 03/04/24 Left 03/04/24  Hip flexion 4+ 4+  Hip extension 4 4  Hip abduction 4+ 5  Hip adduction    Hip internal rotation 5 5  Hip external rotation 4 4+  Knee flexion 5 5  Knee extension 5 5  Ankle dorsiflexion    Ankle plantarflexion    Ankle inversion    Ankle eversion    (* = pain; Blank rows = not tested)  Reflexes R/L Knee Jerk (L3/4): 2+/2+ Ankle Jerk (S1/2): 2+/2+ Clonus: Negative bilat  Muscle Length Hamstrings: R: Positive L: Positive Quadriceps (Ely): R: Positive L: Positive  Palpation TTP pes anserinus  1, adductor longus 1 Graded on 0-4 scale (0 = no pain, 1 = pain, 2 = pain with wincing/grimacing/flinching, 3 = pain with withdrawal, 4 = unwilling to allow palpation)  VASCULAR Dorsalis pedis and posterior tibial pulses are palpable   SPECIAL TESTS  Lumbar Radiculopathy SLUMP: R Negative, L Negative SLR: R Negative, L Negative  Prone Knee Bend: R Positive, L Negative  Meniscus Tests McMurray's Test:  Medial Meniscus (Tibial ER): R: Negative L: Negative Effusion: R: Negative L: Negative   Patellofemoral Pain Syndrome Patellar Tilt (Lateral): R: Not examined L: Not examined Patellar Apprehension: R: Negative L: Negative Squatting pain: R: Negative L: Negative Resisted knee extension pain: R: Negative L: Negative Compression: R: Not examined L: Not examined Clarke's sign: R: Not examined L: Not examined Lateral Pull: R: Negative L: Negative     TODAY'S TREATMENT          06/23/2024   SUBJECTIVE STATEMENT:   Patient reports no disturbed sleep due to pain. Patient reports no pain at arrival to PT. Patient reports no significant flares since  last visit. Patient reports no pain at arrival today.    Thoracolumbar AROM  Flexion 75% (no pain) Extension WNL (no pain) Lateral flexion: R WNL, L WNL Rotation: R 100%, L 100%    Manual Therapy - for symptom modulation, soft tissue sensitivity and mobility, joint mobility, ROM   STM and IASTM (with Edge tool) along R vastus medialis and rectus femoris x 10 minutes STM R adductor longus/magnus; x 5 minutes   Therapeutic Exercise - for improved soft tissue flexibility and extensibility as needed for ROM, neurodynamics to improve neural tension and reduce neuralgias/paresthesias  Seated elliptical; x 5 min, Level 6.5  -interval subjective gathered, 3 minutes not billed  Butterfly stretch; 3 x 30 sec  Prone Quadriceps stretch; 3 x 30 sec  Dying bug, alternating UE/LE with LE hover end of bed; 2 x 10 alt  Prone alt arms/legs; 2  x 10 alt  Physioball bridge, Silver physioball; 2 x 10   PATIENT EDUCATION: HEP reviewed. Discussed current progress and resumption of home-based program if no regression occurs over next 2-3 weeks.    *next visit* Sidelying hip abduction; 4-lb ankle weights; 2 x 10, bilat   *not today* Supine hip abduction stretch, with blanket looped around foot; 3 x 30 sec Bird dog; reviewed Prone hip extension, reviewed Quadruped Academic librarian; 2 x 10, performed on each LE  Sidelying clamshell;  2 x 10 on each side, Green Tband  Glute bridge, with alternating leg extension; 3x5 alt R/L Quadruped donkey kick; 2 x 10 on either side  Prone femoral nerve flossing; 20x Prone femoral nerve tensioner; 20x Supine active hamstring stretch; 20x   PATIENT EDUCATION:  Education details: see above for patient education details Person educated: Patient Education method: Explanation, Demonstration, and Handouts Education comprehension: verbalized understanding and returned demonstration   HOME EXERCISE PROGRAM:  Access Code: [email or web address] URL: [email or web address] Date: 06/23/2024 Prepared by: Venetia Endo  Exercises - Prone Femoral Nerve Mobilization  - 3 x daily - 7 x weekly - 2 sets - 10 reps - 1-2sec hold - Supine Hamstring Stretch  - 3 x daily - 7 x weekly - 2 sets - 10 reps - 1-2sec hold - Supine Butterfly Groin Stretch  - 3 x daily - 7 x weekly - 3 sets - 30sec hold - Supine Bridge  - 1 x daily - 4-5 x weekly - 2 sets - 10 reps - 3sec hold - Prone Alternating Arm and Leg Lifts  - 1 x daily - 4-5 x weekly - 2 sets - 10 reps - Bird Dog  - 1 x daily - 4-5 x weekly - 2 sets - 10 reps - Supine Dead Bug with Leg Extension  - 1 x daily - 4 x weekly - 2 sets - 10 reps   ASSESSMENT:  CLINICAL IMPRESSION: Patient has improved symptoms over last week with no notable flare-up of leg pain. Pt is doing well with current HEP and fortunately has had no notable nocturnal symptoms. We continued  progression of hip strengthening/trunk stabilization today without notable pain. Pt will continue to benefit from skilled PT services to address deficits and improve function.    OBJECTIVE IMPAIRMENTS: difficulty walking, decreased ROM, decreased strength, impaired flexibility, and pain.   ACTIVITY LIMITATIONS: lifting, standing, stairs, transfers, and locomotion level  PARTICIPATION LIMITATIONS: cleaning, shopping, and community activity  PERSONAL FACTORS: Past/current experiences, Time since onset of injury/illness/exacerbation, and 1-2 comorbidities: (Depression, Positive ANA) are also affecting patient's functional outcome.   REHAB POTENTIAL: Good  CLINICAL DECISION MAKING: Unstable/unpredictable  EVALUATION COMPLEXITY: High   GOALS: Goals reviewed with patient? Yes  SHORT TERM GOALS: Target date: 03/28/2024  Pt will be independent with HEP to improve strength and decrease knee pain to improve pain-free function at home and work. Baseline: 03/04/24: Baseline HEP initiated.    04/20/24: Pt reports completing HEP most days.  Goal status: ACHIEVED   LONG TERM GOALS: Target date: 04/18/2024  Pt will have no nocturnal symptoms disturbing sleep as needed for improved sleep quality and long-term wellness Baseline: 03/04/24: Frequent symptoms which occur sporadically even when at rest.   04/20/24: Some back pain disturbing sleep, no leg pain during PM.     06/15/24: No recent nocturnal symptoms Goal status: ACHIEVED  2.  Pt will decrease worst knee pain by at least 3 points on the NPRS in order to demonstrate clinically significant reduction in knee pain. Baseline: 03/04/24: 10/10 pain at worst.    04/20/24: Low back pain, no pain affecting knee/leg over previous week.     06/15/24: 7/10 at worst over previous week.  Goal status: MOSTLY MET   3.  Pt will decrease LEFS score by at least 9 points in order demonstrate clinically significant reduction in knee pain/disability.       Baseline:  03/04/24: 12/80 = 15%.   04/20/24: 61/80 = 76.25% Goal status: ACHIEVED    PLAN: PT FREQUENCY: 1x/week  PT DURATION: 3-4 weeks  PLANNED INTERVENTIONS: Therapeutic exercises, Therapeutic activity, Neuromuscular re-education, Balance training, Gait training, Patient/Family education, Self Care, Joint mobilization, Joint manipulation, Vestibular training, Canalith repositioning, Orthotic/Fit training, DME instructions, Dry Needling, Electrical stimulation, Spinal manipulation, Spinal mobilization, Cryotherapy, Moist heat, Taping, Traction, Ultrasound, Ionotophoresis 4mg /ml Dexamethasone, Manual therapy, and Re-evaluation.  PLAN FOR NEXT SESSION: Neurodynamics; soft tissue stretching for adductors/quads. Manual therapy/dry needling for desensitization of affected tissues. Continued hip and core strengthening with transition to HEP toward end of scheduled visits.    Venetia Endo, PT, DPT #[id number]  Venetia ONEIDA Endo, PT 06/23/2024, 4:30 PM

## 2024-06-30 ENCOUNTER — Ambulatory Visit: Admitting: Physical Therapy

## 2024-06-30 ENCOUNTER — Encounter: Payer: Self-pay | Admitting: Physical Therapy

## 2024-06-30 DIAGNOSIS — R262 Difficulty in walking, not elsewhere classified: Secondary | ICD-10-CM

## 2024-06-30 DIAGNOSIS — M79604 Pain in right leg: Secondary | ICD-10-CM | POA: Diagnosis not present

## 2024-06-30 NOTE — Therapy (Signed)
 OUTPATIENT PHYSICAL THERAPY TREATMENT   Patient Name: Rachel Ortega MRN: [id number] DOB:[birth year], 28 y.o., female Today's Date: 06/30/2024  END OF SESSION:  PT End of Session - 06/30/24 1121     Visit Number 14    Number of Visits 15    Date for PT Re-Evaluation 04/15/24    PT Start Time 1118    PT Stop Time 1202    PT Time Calculation (min) 44 min    Activity Tolerance Patient tolerated treatment well;No increased pain    Behavior During Therapy [facility] for tasks assessed/performed          Past Medical History:  Diagnosis Date   Depression    Medical history non-contributory    Preeclampsia, third trimester 02/05/2018   Past Surgical History:  Procedure Laterality Date   TONSILLECTOMY Bilateral    TONSILLECTOMY     WISDOM TOOTH EXTRACTION Bilateral    Patient Active Problem List   Diagnosis Date Noted   Chronic lower extremity pain (Right) 03/15/2024   Right lumbar radiculitis 03/09/2024   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) 03/09/2024   Transient weakness of right lower extremity 03/09/2024   Obesity, Class I, BMI 30-34.9 02/19/2024   Enthesopathy of knee region (Right) 01/27/2024   Acute knee pain (Right) 01/07/2024   Medial knee pain (Right) 01/07/2024   Postdural puncture headache 11/18/2023   Postpartum spinal headache 11/18/2023   Anxiety due to invasive procedure 11/18/2023   NSVD (normal spontaneous vaginal delivery) 11/13/2023   Gestational diabetes 11/11/2023   Postpartum hemorrhage 11/11/2023   Spotting affecting pregnancy 09/22/2023   Vaginal bleeding during pregnancy 09/22/2023   SS-A antibody positive 09/22/2023   Obesity affecting pregnancy 09/22/2023   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, antepartum 09/22/2023   Rash and nonspecific skin eruption 09/19/2023   Anxiety and depression 07/14/2023   History of pre-eclampsia in prior pregnancy, currently pregnant 07/14/2023   Positive ANA (antinuclear antibody)  07/14/2023   Supervision of high risk pregnancy in second trimester 04/22/2023   Thrombocytopenia due to blood loss 02/07/2018   family H/O inherited metabolic disease 91/97/7981    PCP: Geralene Levorn ORN, NP  REFERRING PROVIDER: Geralene Levorn ORN, NP  REFERRING DIAG:  [phone] (ICD-10-CM) - Acute pain of right knee  M25.561 (ICD-10-CM) - Right medial knee pain  M76.891 (ICD-10-CM) - Enthesopathy of right knee region    RATIONALE FOR EVALUATION AND TREATMENT: Rehabilitation  THERAPY DIAG: Pain in right leg  Difficulty in walking, not elsewhere classified  ONSET DATE: 10/2023  FOLLOW-UP APPT SCHEDULED WITH REFERRING PROVIDER: Yes ; 03/09/24   PERTINENT HISTORY: Pt is a 28 year old female with primary c/o R knee/medial thigh/leg pain. Pt had injections on 02/19/24 in R knee that did not help. Patient reports negative experience with epidural, and she feels that her symptoms began after this. Patient reports burning all of a sudden pain that can go down to her foot. She states it is debilitating. Pt is taking Gabapentin for pain control. No significant integumentary change. Her child is 76 months old and she has 40-year-old. Pt reports doing well with childcare duties. Pt reports being unable to step on RLE when she is having episode of pain. Pt reports some nocturnal symptoms and even pain when at rest; sporadic onset of symptoms. Symptoms go away after 1-2 minutes. Pt is ANA positive; no active autoimmune disease.   PAIN:   Pain Intensity: Present: 0/10, Best: 0/10, Worst: 10/10 Pain location: anteromedial knee along medial  peripatellar region  Pain quality: burning Radiating pain: Yes ; radiating from distomedial thigh down to dorsum of foot Swelling: No  Popping, catching, locking: No  Numbness/Tingling: No Focal weakness or buckling: No Aggravating factors: Hurts sporadically without aggravating position/posture Relieving factors: Gabapentin, massage along distomedial thigh/VMO   24-hour pain behavior: None  History of prior back, hip, or knee injury, pain, surgery, or therapy: Yes; hx of back pain and chiropractic   Imaging: Yes ;  X-rays negative  Prior level of function: Independent 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: Relaxing with family   Living Environment Lives with: lives with their partner, 3 y/o daughter, 45-month-old boy Lives in: House/apartment  Patient Goals: Knee pain gone; able to get off of Gabapentin    OBJECTIVE:   Patient Surveys  LEFS 12/80 = 15%  Gross Musculoskeletal Assessment Tremor: None Bulk: Normal Tone: Normal Pt squats with weight on forefoot and is able to maintain SLR well with control of pronation of ankles    GAIT: Distance walked: 50 ft Assistive device utilized: None Level of assistance: Complete Independence Comments: L>R ankle overpronation  AROM  Thoracolumbar AROM Flexion 75% (no pain) Extension WNL (no pain) Lateral flexion: R 50%, L 100% Rotation: R 100%, L 75%  04/20/24 Flexion 75% (no pain) Extension WNL (no pain) Lateral flexion: R 50%, L 100% Rotation: R 100%, L 75%    AROM (Normal range in degrees) AROM 03/04/24  Hip Right Left  Flexion (125) WNL   Extension (15)    Abduction (40)    Adduction     Internal Rotation (45) WNL   External Rotation (45) WNL       Knee    Flexion (135) WNL   Extension (0) WNL       (* = pain; Blank rows = not tested)  LE MMT: MMT (out of 5) Right 03/04/24 Left 03/04/24  Hip flexion 4+ 4+  Hip extension 4 4  Hip abduction 4+ 5  Hip adduction    Hip internal rotation 5 5  Hip external rotation 4 4+  Knee flexion 5 5  Knee extension 5 5  Ankle dorsiflexion    Ankle plantarflexion    Ankle inversion    Ankle eversion    (* = pain; Blank rows = not tested)  Reflexes R/L Knee Jerk (L3/4): 2+/2+ Ankle Jerk (S1/2): 2+/2+ Clonus: Negative bilat  Muscle Length Hamstrings: R: Positive L: Positive Quadriceps ([place]): R: Positive  L: Positive  Palpation TTP pes anserinus 1, adductor longus 1 Graded on 0-4 scale (0 = no pain, 1 = pain, 2 = pain with wincing/grimacing/flinching, 3 = pain with withdrawal, 4 = unwilling to allow palpation)  VASCULAR Dorsalis pedis and posterior tibial pulses are palpable   SPECIAL TESTS  Lumbar Radiculopathy SLUMP: R Negative, L Negative SLR: R Negative, L Negative  Prone Knee Bend: R Positive, L Negative  Meniscus Tests McMurray's Test:  Medial Meniscus (Tibial ER): R: Negative L: Negative Effusion: R: Negative L: Negative   Patellofemoral Pain Syndrome Patellar Tilt (Lateral): R: Not examined L: Not examined Patellar Apprehension: R: Negative L: Negative Squatting pain: R: Negative L: Negative Resisted knee extension pain: R: Negative L: Negative Compression: R: Not examined L: Not examined Clarke's sign: R: Not examined L: Not examined Lateral Pull: R: Negative L: Negative     TODAY'S TREATMENT          06/30/2024   SUBJECTIVE STATEMENT:   Patient reports some pain yesterday along adductor region. Pt reports some pain  along VMO also. Patient reports no notable pain today. Patient reports no nocturnal disturbance due to pain.     Manual Therapy - for symptom modulation, soft tissue sensitivity and mobility, joint mobility, ROM   STM along R vastus medialis and rectus femoris x 5 minutes STM R adductor longus/magnus and gracilis; x 8 minutes    Trigger Point Dry Needling  Subsequent Treatment: Instructions provided previously at initial dry needling treatment.   Patient Verbal Consent Given: Yes Education Handout Provided: Previously Provided Muscles Treated: L adductor magnus x 2, L gracilis Electrical Stimulation Performed: No Treatment Response/Outcome: No significant pain post-treatment     Therapeutic Exercise - for improved soft tissue flexibility and extensibility as needed for ROM, neurodynamics to improve neural tension and reduce  neuralgias/paresthesias  Seated elliptical; x 7 min, Level 6.5  -interval subjective gathered, 3 minutes not billed  Supine hamstring stretch; 2 x 30 sec, draw sheet Supine adductor stretch; 2 x 30 sec, draw sheet  Dying bug, alternating UE/LE with LE hover end of bed; 2 x 10 alt  Prone alt arms/legs; 2 x 10 alt  Sidelying hip abduction; 4-lb ankle weights; 2 x 10, bilat    PATIENT EDUCATION: Discussed expectations following dry needling and encouraged continued HEP.     *not today* Physioball bridge, Silver physioball; 2 x 10  Prone Quadriceps stretch; 3 x 30 sec Butterfly stretch; 3 x 30 sec Supine hip abduction stretch, with blanket looped around foot; 3 x 30 sec Bird dog; reviewed Prone hip extension, reviewed Quadruped Academic librarian; 2 x 10, performed on each LE  Sidelying clamshell;  2 x 10 on each side, Green Tband  Glute bridge, with alternating leg extension; 3x5 alt R/L Quadruped donkey kick; 2 x 10 on either side  Prone femoral nerve flossing; 20x Prone femoral nerve tensioner; 20x Supine active hamstring stretch; 20x   PATIENT EDUCATION:  Education details: see above for patient education details Person educated: Patient Education method: Explanation, Demonstration, and Handouts Education comprehension: verbalized understanding and returned demonstration   HOME EXERCISE PROGRAM:  Access Code: [email or web address] URL: [email or web address] Date: 06/23/2024 Prepared by: Venetia Endo  Exercises - Prone Femoral Nerve Mobilization  - 3 x daily - 7 x weekly - 2 sets - 10 reps - 1-2sec hold - Supine Hamstring Stretch  - 3 x daily - 7 x weekly - 2 sets - 10 reps - 1-2sec hold - Supine Butterfly Groin Stretch  - 3 x daily - 7 x weekly - 3 sets - 30sec hold - Supine Bridge  - 1 x daily - 4-5 x weekly - 2 sets - 10 reps - 3sec hold - Prone Alternating Arm and Leg Lifts  - 1 x daily - 4-5 x weekly - 2 sets - 10 reps - Bird Dog  - 1 x daily - 4-5 x weekly - 2  sets - 10 reps - Supine Dead Bug with Leg Extension  - 1 x daily - 4 x weekly - 2 sets - 10 reps   ASSESSMENT:  CLINICAL IMPRESSION: Patient had episode of R thigh/adductor pain yesterday, but she has not experienced night pain and also has no pain at arrival to clinic. She has done well with stretches and additional strengthening drills at home. We re-visited dry needling today for adductor group with sound twitch response and concordant pain briefly reproduced. Pt tolerates session well. Pt will continue to benefit from skilled PT services to address deficits and improve function.    OBJECTIVE IMPAIRMENTS:  difficulty walking, decreased ROM, decreased strength, impaired flexibility, and pain.   ACTIVITY LIMITATIONS: lifting, standing, stairs, transfers, and locomotion level  PARTICIPATION LIMITATIONS: cleaning, shopping, and community activity  PERSONAL FACTORS: Past/current experiences, Time since onset of injury/illness/exacerbation, and 1-2 comorbidities: (Depression, Positive ANA) are also affecting patient's functional outcome.   REHAB POTENTIAL: Good  CLINICAL DECISION MAKING: Unstable/unpredictable  EVALUATION COMPLEXITY: High   GOALS: Goals reviewed with patient? Yes  SHORT TERM GOALS: Target date: 03/28/2024  Pt will be independent with HEP to improve strength and decrease knee pain to improve pain-free function at home and work. Baseline: 03/04/24: Baseline HEP initiated.    04/20/24: Pt reports completing HEP most days.  Goal status: ACHIEVED   LONG TERM GOALS: Target date: 04/18/2024  Pt will have no nocturnal symptoms disturbing sleep as needed for improved sleep quality and long-term wellness Baseline: 03/04/24: Frequent symptoms which occur sporadically even when at rest.   04/20/24: Some back pain disturbing sleep, no leg pain during PM.     06/15/24: No recent nocturnal symptoms Goal status: ACHIEVED  2.  Pt will decrease worst knee pain by at least 3 points on the  NPRS in order to demonstrate clinically significant reduction in knee pain. Baseline: 03/04/24: 10/10 pain at worst.    04/20/24: Low back pain, no pain affecting knee/leg over previous week.     06/15/24: 7/10 at worst over previous week.  Goal status: MOSTLY MET   3.  Pt will decrease LEFS score by at least 9 points in order demonstrate clinically significant reduction in knee pain/disability.       Baseline: 03/04/24: 12/80 = 15%.   04/20/24: 61/80 = 76.25% Goal status: ACHIEVED    PLAN: PT FREQUENCY: 1x/week  PT DURATION: 3-4 weeks  PLANNED INTERVENTIONS: Therapeutic exercises, Therapeutic activity, Neuromuscular re-education, Balance training, Gait training, Patient/Family education, Self Care, Joint mobilization, Joint manipulation, Vestibular training, Canalith repositioning, Orthotic/Fit training, DME instructions, Dry Needling, Electrical stimulation, Spinal manipulation, Spinal mobilization, Cryotherapy, Moist heat, Taping, Traction, Ultrasound, Ionotophoresis 4mg /ml Dexamethasone, Manual therapy, and Re-evaluation.  PLAN FOR NEXT SESSION: Neurodynamics; soft tissue stretching for adductors/quads. Manual therapy/dry needling for desensitization of affected tissues. Continued hip and core strengthening with transition to HEP toward end of scheduled visits.    Venetia Endo, PT, DPT #[id number]  Venetia ONEIDA Endo, PT 06/30/2024, 12:31 PM

## 2024-07-06 NOTE — Therapy (Signed)
 OUTPATIENT PHYSICAL THERAPY TREATMENT   Patient Name: Rachel Ortega MRN: [id number] DOB:1996-10-23, 28 y.o., female Today's Date: 07/07/2024  END OF SESSION:  PT End of Session - 07/11/24 2132     Visit Number 15    PT Start Time 0951    PT Stop Time 1036    PT Time Calculation (min) 45 min    Activity Tolerance Patient tolerated treatment well;No increased pain    Behavior During Therapy [facility] for tasks assessed/performed           Past Medical History:  Diagnosis Date   Depression    Medical history non-contributory    Preeclampsia, third trimester 02/05/2018   Past Surgical History:  Procedure Laterality Date   TONSILLECTOMY Bilateral    TONSILLECTOMY     WISDOM TOOTH EXTRACTION Bilateral    Patient Active Problem List   Diagnosis Date Noted   Chronic lower extremity pain (Right) 03/15/2024   Right lumbar radiculitis 03/09/2024   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) 03/09/2024   Transient weakness of right lower extremity 03/09/2024   Obesity, Class I, BMI 30-34.9 02/19/2024   Enthesopathy of knee region (Right) 01/27/2024   Acute knee pain (Right) 01/07/2024   Medial knee pain (Right) 01/07/2024   Postdural puncture headache 11/18/2023   Postpartum spinal headache 11/18/2023   Anxiety due to invasive procedure 11/18/2023   NSVD (normal spontaneous vaginal delivery) 11/13/2023   Gestational diabetes 11/11/2023   Postpartum hemorrhage 11/11/2023   Spotting affecting pregnancy 09/22/2023   Vaginal bleeding during pregnancy 09/22/2023   SS-A antibody positive 09/22/2023   Obesity affecting pregnancy 09/22/2023   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, antepartum 09/22/2023   Rash and nonspecific skin eruption 09/19/2023   Anxiety and depression 07/14/2023   History of pre-eclampsia in prior pregnancy, currently pregnant 07/14/2023   Positive ANA (antinuclear antibody) 07/14/2023   Supervision of high risk pregnancy in second  trimester 04/22/2023   Thrombocytopenia due to blood loss 02/07/2018   family H/O inherited metabolic disease 91/97/7981    PCP: Geralene Levorn ORN, NP  REFERRING PROVIDER: Tanya Glisson, MD  REFERRING DIAG:  [phone] (ICD-10-CM) - Acute pain of right knee  M25.561 (ICD-10-CM) - Right medial knee pain  M76.891 (ICD-10-CM) - Enthesopathy of right knee region    RATIONALE FOR EVALUATION AND TREATMENT: Rehabilitation  THERAPY DIAG: Pain in right leg  Difficulty in walking, not elsewhere classified  ONSET DATE: 10/2023  FOLLOW-UP APPT SCHEDULED WITH REFERRING PROVIDER: Yes ; 03/09/24   PERTINENT HISTORY: Pt is a 28 year old female with primary c/o R knee/medial thigh/leg pain. Pt had injections on 02/19/24 in R knee that did not help. Patient reports negative experience with epidural, and she feels that her symptoms began after this. Patient reports burning all of a sudden pain that can go down to her foot. She states it is debilitating. Pt is taking Gabapentin for pain control. No significant integumentary change. Her child is 46 months old and she has 50-year-old. Pt reports doing well with childcare duties. Pt reports being unable to step on RLE when she is having episode of pain. Pt reports some nocturnal symptoms and even pain when at rest; sporadic onset of symptoms. Symptoms go away after 1-2 minutes. Pt is ANA positive; no active autoimmune disease.   PAIN:   Pain Intensity: Present: 0/10, Best: 0/10, Worst: 10/10 Pain location: anteromedial knee along medial peripatellar region  Pain quality: burning Radiating pain: Yes ; radiating from distomedial thigh down  to dorsum of foot Swelling: No  Popping, catching, locking: No  Numbness/Tingling: No Focal weakness or buckling: No Aggravating factors: Hurts sporadically without aggravating position/posture Relieving factors: Gabapentin, massage along distomedial thigh/VMO  24-hour pain behavior: None  History of prior back,  hip, or knee injury, pain, surgery, or therapy: Yes; hx of back pain and chiropractic   Imaging: Yes ;  X-rays negative  Prior level of function: Independent 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: Relaxing with family   Living Environment Lives with: lives with their partner, 52 y/o daughter, 38-month-old boy Lives in: House/apartment  Patient Goals: Knee pain gone; able to get off of Gabapentin    OBJECTIVE:   Patient Surveys  LEFS 12/80 = 15%  Gross Musculoskeletal Assessment Tremor: None Bulk: Normal Tone: Normal Pt squats with weight on forefoot and is able to maintain SLR well with control of pronation of ankles    GAIT: Distance walked: 50 ft Assistive device utilized: None Level of assistance: Complete Independence Comments: L>R ankle overpronation  AROM  Thoracolumbar AROM Flexion 75% (no pain) Extension WNL (no pain) Lateral flexion: R 50%, L 100% Rotation: R 100%, L 75%  04/20/24 Flexion 75% (no pain) Extension WNL (no pain) Lateral flexion: R 50%, L 100% Rotation: R 100%, L 75%    AROM (Normal range in degrees) AROM 03/04/24  Hip Right Left  Flexion (125) WNL   Extension (15)    Abduction (40)    Adduction     Internal Rotation (45) WNL   External Rotation (45) WNL       Knee    Flexion (135) WNL   Extension (0) WNL       (* = pain; Blank rows = not tested)  LE MMT: MMT (out of 5) Right 03/04/24 Left 03/04/24  Hip flexion 4+ 4+  Hip extension 4 4  Hip abduction 4+ 5  Hip adduction    Hip internal rotation 5 5  Hip external rotation 4 4+  Knee flexion 5 5  Knee extension 5 5  Ankle dorsiflexion    Ankle plantarflexion    Ankle inversion    Ankle eversion    (* = pain; Blank rows = not tested)  Reflexes R/L Knee Jerk (L3/4): 2+/2+ Ankle Jerk (S1/2): 2+/2+ Clonus: Negative bilat  Muscle Length Hamstrings: R: Positive L: Positive Quadriceps (Ely): R: Positive L: Positive   Palpation TTP pes anserinus 1,  adductor longus 1 Graded on 0-4 scale (0 = no pain, 1 = pain, 2 = pain with wincing/grimacing/flinching, 3 = pain with withdrawal, 4 = unwilling to allow palpation)  VASCULAR Dorsalis pedis and posterior tibial pulses are palpable   SPECIAL TESTS  Lumbar Radiculopathy SLUMP: R Negative, L Negative SLR: R Negative, L Negative  Prone Knee Bend: R Positive, L Negative  Meniscus Tests McMurray's Test:  Medial Meniscus (Tibial ER): R: Negative L: Negative Effusion: R: Negative L: Negative   Patellofemoral Pain Syndrome Patellar Tilt (Lateral): R: Not examined L: Not examined Patellar Apprehension: R: Negative L: Negative Squatting pain: R: Negative L: Negative Resisted knee extension pain: R: Negative L: Negative Compression: R: Not examined L: Not examined Clarke's sign: R: Not examined L: Not examined Lateral Pull: R: Negative L: Negative     TODAY'S TREATMENT          07/07/2024   SUBJECTIVE STATEMENT:   Patient reports benefit with PT to date. Pt reports more soreness after DN last time. She reports VMO region hurt this AM around 6:00. Patient reports  soreness in anterior thigh presently. Discomfort/mild pain along R VMO presently.     Manual Therapy - for symptom modulation, soft tissue sensitivity and mobility, joint mobility, ROM   STM and IASTM with Edge tool along R vastus medialis and rectus femoris x 8 minutes  Manual prone quad stretch; x 60 sec  *not today* STM R adductor longus/magnus and gracilis; x 8 minutes     Therapeutic Exercise - for improved soft tissue flexibility and extensibility as needed for ROM, neurodynamics to improve neural tension and reduce neuralgias/paresthesias  NuStep cycling; x 7 min, Level 5  -interval subjective gathered, 4 minutes not billed  Prone alt arms/legs; 2 x 10 alt  Dying bug, alternating UE/LE with LE hover end of bed; 2 x 10 alt  Modified side plank with Green Tband clamshell; 2 x 6 on each side   PATIENT  EDUCATION: Discussed current POC and tapered frequency of PT with ongoing advanced HEP.     *not today* Supine hamstring stretch; 2 x 30 sec, draw sheet Supine adductor stretch; 2 x 30 sec, draw sheet Physioball bridge, Silver physioball; 2 x 10  Prone Quadriceps stretch; 3 x 30 sec Butterfly stretch; 3 x 30 sec Supine hip abduction stretch, with blanket looped around foot; 3 x 30 sec Bird dog; reviewed Prone hip extension, reviewed Quadruped Academic librarian; 2 x 10, performed on each LE  Sidelying clamshell;  2 x 10 on each side, Green Tband  Glute bridge, with alternating leg extension; 3x5 alt R/L Quadruped donkey kick; 2 x 10 on either side  Prone femoral nerve flossing; 20x Prone femoral nerve tensioner; 20x Supine active hamstring stretch; 20x   PATIENT EDUCATION:  Education details: see above for patient education details Person educated: Patient Education method: Explanation, Demonstration, and Handouts Education comprehension: verbalized understanding and returned demonstration   HOME EXERCISE PROGRAM:  Access Code: [email or web address] URL: [email or web address] Date: 07/07/2024 Prepared by: Venetia Endo  Exercises - Prone Femoral Nerve Mobilization  - 3 x daily - 7 x weekly - 2 sets - 10 reps - 1-2sec hold - Supine Hamstring Stretch  - 3 x daily - 7 x weekly - 2 sets - 10 reps - 1-2sec hold - Supine Butterfly Groin Stretch  - 3 x daily - 7 x weekly - 3 sets - 30sec hold - Prone Alternating Arm and Leg Lifts  - 1 x daily - 4-5 x weekly - 2 sets - 10 reps - Bird Dog  - 1 x daily - 4-5 x weekly - 2 sets - 10 reps - Supine Dead Bug with Leg Extension  - 1 x daily - 4 x weekly - 2 sets - 10 reps - Side Plank with Clam and Resistance  - 1 x daily - 4 x weekly - 2 sets - 6-8 reps   ASSESSMENT:  CLINICAL IMPRESSION: Patient has experienced intermittent episodes of R thigh pain after DN last visit and is continuing Gabapentin treatment. She reports benefit from use  of Edge Tool/IASTM; we utilized this again today for symptom modulation and desensitization of affected tissues. Pt will be continuing with neurodynamics at home along with stretching program and progressive hip and trunk strengthening. Pt tolerates session well. Pt will continue to benefit from skilled PT services to address deficits and improve function.    OBJECTIVE IMPAIRMENTS: difficulty walking, decreased ROM, decreased strength, impaired flexibility, and pain.   ACTIVITY LIMITATIONS: lifting, standing, stairs, transfers, and locomotion level  PARTICIPATION LIMITATIONS: cleaning, shopping, and community activity  PERSONAL FACTORS: Past/current experiences, Time since onset of injury/illness/exacerbation, and 1-2 comorbidities: (Depression, Positive ANA) are also affecting patient's functional outcome.   REHAB POTENTIAL: Good  CLINICAL DECISION MAKING: Unstable/unpredictable  EVALUATION COMPLEXITY: High   GOALS: Goals reviewed with patient? Yes  SHORT TERM GOALS: Target date: 03/28/2024  Pt will be independent with HEP to improve strength and decrease knee pain to improve pain-free function at home and work. Baseline: 03/04/24: Baseline HEP initiated.    04/20/24: Pt reports completing HEP most days.  Goal status: ACHIEVED   LONG TERM GOALS: Target date: 04/18/2024  Pt will have no nocturnal symptoms disturbing sleep as needed for improved sleep quality and long-term wellness Baseline: 03/04/24: Frequent symptoms which occur sporadically even when at rest.   04/20/24: Some back pain disturbing sleep, no leg pain during PM.     06/15/24: No recent nocturnal symptoms Goal status: ACHIEVED  2.  Pt will decrease worst knee pain by at least 3 points on the NPRS in order to demonstrate clinically significant reduction in knee pain. Baseline: 03/04/24: 10/10 pain at worst.    04/20/24: Low back pain, no pain affecting knee/leg over previous week.     06/15/24: 7/10 at worst over previous week.   Goal status: MOSTLY MET   3.  Pt will decrease LEFS score by at least 9 points in order demonstrate clinically significant reduction in knee pain/disability.       Baseline: 03/04/24: 12/80 = 15%.   04/20/24: 61/80 = 76.25% Goal status: ACHIEVED    PLAN: PT FREQUENCY: 1x/week  PT DURATION: 3-4 weeks  PLANNED INTERVENTIONS: Therapeutic exercises, Therapeutic activity, Neuromuscular re-education, Balance training, Gait training, Patient/Family education, Self Care, Joint mobilization, Joint manipulation, Vestibular training, Canalith repositioning, Orthotic/Fit training, DME instructions, Dry Needling, Electrical stimulation, Spinal manipulation, Spinal mobilization, Cryotherapy, Moist heat, Taping, Traction, Ultrasound, Ionotophoresis 4mg /ml Dexamethasone, Manual therapy, and Re-evaluation.  PLAN FOR NEXT SESSION: Neurodynamics; soft tissue stretching for adductors/quads. Manual therapy/dry needling for desensitization of affected tissues. Continued hip and core strengthening with transition to HEP toward end of scheduled visits.    Venetia Endo, PT, DPT #[id number]  Venetia ONEIDA Endo, PT 07/11/2024, 9:33 PM

## 2024-07-07 ENCOUNTER — Ambulatory Visit: Admitting: Physical Therapy

## 2024-07-07 DIAGNOSIS — M79604 Pain in right leg: Secondary | ICD-10-CM | POA: Diagnosis not present

## 2024-07-07 DIAGNOSIS — R262 Difficulty in walking, not elsewhere classified: Secondary | ICD-10-CM

## 2024-07-11 ENCOUNTER — Encounter: Payer: Self-pay | Admitting: Physical Therapy

## 2024-07-22 ENCOUNTER — Ambulatory Visit: Admitting: Physical Therapy

## 2024-07-29 ENCOUNTER — Ambulatory Visit: Admitting: Physical Therapy

## 2024-07-29 NOTE — Therapy (Deleted)
 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) 03/09/2024   Transient weakness of right lower extremity 03/09/2024   Obesity, Class I, BMI 30-34.9 02/19/2024   Enthesopathy of knee region (Right) 01/27/2024   Acute knee pain (Right) 01/07/2024   Medial knee pain (Right) 01/07/2024   Postdural puncture headache 11/18/2023   Postpartum spinal headache 11/18/2023   Anxiety due to invasive procedure 11/18/2023   NSVD (normal spontaneous vaginal delivery) 11/13/2023   Gestational diabetes 11/11/2023   Postpartum hemorrhage 11/11/2023   Spotting affecting pregnancy 09/22/2023   Vaginal bleeding during pregnancy 09/22/2023   SS-A antibody positive 09/22/2023   Obesity affecting pregnancy 09/22/2023   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, antepartum 09/22/2023   Rash and nonspecific skin eruption 09/19/2023   Anxiety and depression 07/14/2023   History of pre-eclampsia in prior pregnancy, currently pregnant 07/14/2023   Positive ANA (antinuclear antibody) 07/14/2023   Supervision of high risk pregnancy in second trimester 04/22/2023   Thrombocytopenia due to blood loss 02/07/2018   family H/O inherited metabolic disease 91/97/7981    PCP: Geralene Levorn ORN, NP  REFERRING PROVIDER: Tanya Glisson, MD  REFERRING DIAG:  [phone] (ICD-10-CM) - Acute pain of right knee  M25.561  (ICD-10-CM) - Right medial knee pain  M76.891 (ICD-10-CM) - Enthesopathy of right knee region    RATIONALE FOR EVALUATION AND TREATMENT: Rehabilitation  THERAPY DIAG: Pain in right leg  Difficulty in walking, not elsewhere classified  ONSET DATE: 10/2023  FOLLOW-UP APPT SCHEDULED WITH REFERRING PROVIDER: Yes ; 03/09/24   PERTINENT HISTORY: Pt is a 28 year old female with primary c/o R knee/medial thigh/leg pain. Pt had injections on 02/19/24 in R knee that did not help. Patient reports negative experience with epidural, and she feels that her symptoms began after this. Patient reports burning all of a sudden pain that can go down to her foot. She states it is debilitating. Pt is taking Gabapentin for pain control. No significant integumentary change. Her child is 51 months old and she has 23-year-old. Pt reports doing well with childcare duties. Pt reports being unable to step on RLE when she is having episode of pain. Pt reports some nocturnal symptoms and even pain when at rest; sporadic onset of symptoms. Symptoms go away after 1-2 minutes. Pt is ANA positive; no active autoimmune disease.   PAIN:   Pain Intensity: Present: 0/10, Best: 0/10, Worst: 10/10 Pain location: anteromedial knee along medial peripatellar region  Pain quality: burning Radiating pain: Yes ; radiating from distomedial thigh down to dorsum of foot Swelling: No  Popping, catching, locking: No  Numbness/Tingling: No Focal weakness or buckling: No Aggravating factors: Hurts sporadically without aggravating position/posture Relieving factors: Gabapentin, massage along distomedial thigh/VMO  24-hour pain behavior: None  History of prior back, hip, or knee injury, pain, surgery, or therapy: Yes; hx of back pain and chiropractic   Imaging: Yes ;  X-rays  negative  Prior level of function: Independent 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: Relaxing with family   Living Environment Lives with: lives with  their partner, 67 y/o daughter, 32-month-old boy Lives in: House/apartment  Patient Goals: Knee pain gone; able to get off of Gabapentin    OBJECTIVE:   Patient Surveys  LEFS 12/80 = 15%  Gross Musculoskeletal Assessment Tremor: None Bulk: Normal Tone: Normal Pt squats with weight on forefoot and is able to maintain SLR well with control of pronation of ankles    GAIT: Distance walked: 50 ft Assistive device utilized: None Level of assistance: Complete Independence Comments: L>R ankle overpronation  AROM  Thoracolumbar AROM Flexion 75% (no pain) Extension WNL (no pain) Lateral flexion: R 50%, L 100% Rotation: R 100%, L 75%  04/20/24 Flexion 75% (no pain) Extension WNL (no pain) Lateral flexion: R 50%, L 100% Rotation: R 100%, L 75%    AROM (Normal range in degrees) AROM 03/04/24  Hip Right Left  Flexion (125) WNL   Extension (15)    Abduction (40)    Adduction     Internal Rotation (45) WNL   External Rotation (45) WNL       Knee    Flexion (135) WNL   Extension (0) WNL       (* = pain; Blank rows = not tested)  LE MMT: MMT (out of 5) Right 03/04/24 Left 03/04/24  Hip flexion 4+ 4+  Hip extension 4 4  Hip abduction 4+ 5  Hip adduction    Hip internal rotation 5 5  Hip external rotation 4 4+  Knee flexion 5 5  Knee extension 5 5  Ankle dorsiflexion    Ankle plantarflexion    Ankle inversion    Ankle eversion    (* = pain; Blank rows = not tested)  Reflexes R/L Knee Jerk (L3/4): 2+/2+ Ankle Jerk (S1/2): 2+/2+ Clonus: Negative bilat  Muscle Length Hamstrings: R: Positive L: Positive Quadriceps (Ely): R: Positive L: Positive   Palpation TTP pes anserinus 1, adductor longus 1 Graded on 0-4 scale (0 = no pain, 1 = pain, 2 = pain with wincing/grimacing/flinching, 3 = pain with withdrawal, 4 = unwilling to allow palpation)  VASCULAR Dorsalis pedis and posterior tibial pulses are palpable   SPECIAL TESTS  Lumbar Radiculopathy SLUMP: R  Negative, L Negative SLR: R Negative, L Negative  Prone Knee Bend: R Positive, L Negative  Meniscus Tests McMurray's Test:  Medial Meniscus (Tibial ER): R: Negative L: Negative Effusion: R: Negative L: Negative   Patellofemoral Pain Syndrome Patellar Tilt (Lateral): R: Not examined L: Not examined Patellar Apprehension: R: Negative L: Negative Squatting pain: R: Negative L: Negative Resisted knee extension pain: R: Negative L: Negative Compression: R: Not examined L: Not examined Clarke's sign: R: Not examined L: Not examined Lateral Pull: R: Negative L: Negative     TODAY'S TREATMENT          07/29/2024    SUBJECTIVE STATEMENT:   Patient reports benefit with PT to date. Pt reports more soreness after DN last time. She reports VMO region hurt this AM around 6:00. Patient reports soreness in anterior thigh presently. Discomfort/mild pain along R VMO presently.     Manual Therapy - for symptom modulation, soft tissue sensitivity and mobility, joint mobility, ROM   STM and IASTM with Edge tool along R vastus medialis and rectus femoris x 8 minutes  Manual prone quad stretch; x 60 sec  *not today* STM R adductor longus/magnus and gracilis;  x 8 minutes     Therapeutic Exercise - for improved soft tissue flexibility and extensibility as needed for ROM, neurodynamics to improve neural tension and reduce neuralgias/paresthesias  NuStep cycling; x 7 min, Level 5  -interval subjective gathered, 4 minutes not billed  Prone alt arms/legs; 2 x 10 alt  Dying bug, alternating UE/LE with LE hover end of bed; 2 x 10 alt  Modified side plank with Green Tband clamshell; 2 x 6 on each side   PATIENT EDUCATION: Discussed current POC and tapered frequency of PT with ongoing advanced HEP.     *not today* Supine hamstring stretch; 2 x 30 sec, draw sheet Supine adductor stretch; 2 x 30 sec, draw sheet Physioball bridge, Silver physioball; 2 x 10  Prone Quadriceps stretch; 3 x 30  sec Butterfly stretch; 3 x 30 sec Supine hip abduction stretch, with blanket looped around foot; 3 x 30 sec Bird dog; reviewed Prone hip extension, reviewed Quadruped Academic librarian; 2 x 10, performed on each LE  Sidelying clamshell;  2 x 10 on each side, Green Tband  Glute bridge, with alternating leg extension; 3x5 alt R/L Quadruped donkey kick; 2 x 10 on either side  Prone femoral nerve flossing; 20x Prone femoral nerve tensioner; 20x Supine active hamstring stretch; 20x   PATIENT EDUCATION:  Education details: see above for patient education details Person educated: Patient Education method: Explanation, Demonstration, and Handouts Education comprehension: verbalized understanding and returned demonstration   HOME EXERCISE PROGRAM:  Access Code: [email or web address] URL: [email or web address] Date: 07/07/2024 Prepared by: Venetia Endo  Exercises - Prone Femoral Nerve Mobilization  - 3 x daily - 7 x weekly - 2 sets - 10 reps - 1-2sec hold - Supine Hamstring Stretch  - 3 x daily - 7 x weekly - 2 sets - 10 reps - 1-2sec hold - Supine Butterfly Groin Stretch  - 3 x daily - 7 x weekly - 3 sets - 30sec hold - Prone Alternating Arm and Leg Lifts  - 1 x daily - 4-5 x weekly - 2 sets - 10 reps - Bird Dog  - 1 x daily - 4-5 x weekly - 2 sets - 10 reps - Supine Dead Bug with Leg Extension  - 1 x daily - 4 x weekly - 2 sets - 10 reps - Side Plank with Clam and Resistance  - 1 x daily - 4 x weekly - 2 sets - 6-8 reps   ASSESSMENT:  CLINICAL IMPRESSION: Patient has experienced intermittent episodes of R thigh pain after DN last visit and is continuing Gabapentin treatment. She reports benefit from use of Edge Tool/IASTM; we utilized this again today for symptom modulation and desensitization of affected tissues. Pt will be continuing with neurodynamics at home along with stretching program and progressive hip and trunk strengthening. Pt tolerates session well. Pt will continue to  benefit from skilled PT services to address deficits and improve function.    OBJECTIVE IMPAIRMENTS: difficulty walking, decreased ROM, decreased strength, impaired flexibility, and pain.   ACTIVITY LIMITATIONS: lifting, standing, stairs, transfers, and locomotion level  PARTICIPATION LIMITATIONS: cleaning, shopping, and community activity  PERSONAL FACTORS: Past/current experiences, Time since onset of injury/illness/exacerbation, and 1-2 comorbidities: (Depression, Positive ANA) are also affecting patient's functional outcome.   REHAB POTENTIAL: Good  CLINICAL DECISION MAKING: Unstable/unpredictable  EVALUATION COMPLEXITY: High   GOALS: Goals reviewed with patient? Yes  SHORT TERM GOALS: Target date: 03/28/2024  Pt will be independent with HEP to improve strength and decrease knee pain  to improve pain-free function at home and work. Baseline: 03/04/24: Baseline HEP initiated.    04/20/24: Pt reports completing HEP most days.  Goal status: ACHIEVED   LONG TERM GOALS: Target date: 04/18/2024  Pt will have no nocturnal symptoms disturbing sleep as needed for improved sleep quality and long-term wellness Baseline: 03/04/24: Frequent symptoms which occur sporadically even when at rest.   04/20/24: Some back pain disturbing sleep, no leg pain during PM.     06/15/24: No recent nocturnal symptoms Goal status: ACHIEVED  2.  Pt will decrease worst knee pain by at least 3 points on the NPRS in order to demonstrate clinically significant reduction in knee pain. Baseline: 03/04/24: 10/10 pain at worst.    04/20/24: Low back pain, no pain affecting knee/leg over previous week.     06/15/24: 7/10 at worst over previous week.  Goal status: MOSTLY MET   3.  Pt will decrease LEFS score by at least 9 points in order demonstrate clinically significant reduction in knee pain/disability.       Baseline: 03/04/24: 12/80 = 15%.   04/20/24: 61/80 = 76.25% Goal status: ACHIEVED    PLAN: PT FREQUENCY:  1x/week  PT DURATION: 3-4 weeks  PLANNED INTERVENTIONS: Therapeutic exercises, Therapeutic activity, Neuromuscular re-education, Balance training, Gait training, Patient/Family education, Self Care, Joint mobilization, Joint manipulation, Vestibular training, Canalith repositioning, Orthotic/Fit training, DME instructions, Dry Needling, Electrical stimulation, Spinal manipulation, Spinal mobilization, Cryotherapy, Moist heat, Taping, Traction, Ultrasound, Ionotophoresis 4mg /ml Dexamethasone, Manual therapy, and Re-evaluation.  PLAN FOR NEXT SESSION: Neurodynamics; soft tissue stretching for adductors/quads. Manual therapy/dry needling for desensitization of affected tissues. Continued hip and core strengthening with transition to HEP toward end of scheduled visits.    Venetia Endo, PT, DPT #[id number]  Venetia ONEIDA Endo, PT 07/29/2024, 7:37 AM

## 2024-08-02 ENCOUNTER — Ambulatory Visit: Payer: Self-pay | Admitting: Physical Therapy

## 2024-08-03 NOTE — Addendum Note (Signed)
 Addended by: ANDRA DITCH T on: 08/03/2024 11:52 AM   Modules accepted: Orders

## 2024-08-16 ENCOUNTER — Ambulatory Visit: Admitting: Physical Therapy

## 2025-01-06 ENCOUNTER — Other Ambulatory Visit: Payer: Self-pay | Admitting: Gastroenterology

## 2025-01-06 DIAGNOSIS — R1084 Generalized abdominal pain: Secondary | ICD-10-CM

## 2025-01-10 ENCOUNTER — Ambulatory Visit
Admission: RE | Admit: 2025-01-10 | Discharge: 2025-01-10 | Disposition: A | Discharge status: Home / Self Care | Source: Ambulatory Visit | Attending: Gastroenterology | Admitting: Gastroenterology

## 2025-01-10 DIAGNOSIS — R1084 Generalized abdominal pain: Secondary | ICD-10-CM | POA: Diagnosis present

## 2025-01-10 MED ORDER — IOHEXOL 300 MG/ML  SOLN
100.0000 mL | Freq: Once | INTRAMUSCULAR | Status: AC | PRN
  Administered 2025-01-10: 100 mL via INTRAVENOUS

## 2025-01-11 ENCOUNTER — Ambulatory Visit: Payer: Self-pay | Admitting: Gastroenterology
